# Patient Record
Sex: Female | Born: 1935
Health system: Southern US, Community
[De-identification: ages and names within clinical notes are randomized; demographics above are authoritative.]

## PROBLEM LIST (undated history)

## (undated) DIAGNOSIS — I779 Disorder of arteries and arterioles, unspecified: Secondary | ICD-10-CM

## (undated) DIAGNOSIS — C801 Malignant (primary) neoplasm, unspecified: Secondary | ICD-10-CM

## (undated) DIAGNOSIS — I5189 Other ill-defined heart diseases: Secondary | ICD-10-CM

## (undated) DIAGNOSIS — R55 Syncope and collapse: Secondary | ICD-10-CM

## (undated) DIAGNOSIS — I10 Essential (primary) hypertension: Secondary | ICD-10-CM

## (undated) DIAGNOSIS — C4490 Unspecified malignant neoplasm of skin, unspecified: Secondary | ICD-10-CM

## (undated) DIAGNOSIS — I2510 Atherosclerotic heart disease of native coronary artery without angina pectoris: Secondary | ICD-10-CM

## (undated) DIAGNOSIS — E785 Hyperlipidemia, unspecified: Secondary | ICD-10-CM

## (undated) DIAGNOSIS — J302 Other seasonal allergic rhinitis: Secondary | ICD-10-CM

## (undated) DIAGNOSIS — I739 Peripheral vascular disease, unspecified: Secondary | ICD-10-CM

## (undated) DIAGNOSIS — I951 Orthostatic hypotension: Secondary | ICD-10-CM

## (undated) DIAGNOSIS — M1990 Unspecified osteoarthritis, unspecified site: Secondary | ICD-10-CM

## (undated) DIAGNOSIS — K219 Gastro-esophageal reflux disease without esophagitis: Secondary | ICD-10-CM

## (undated) HISTORY — PX: ABDOMINAL HYSTERECTOMY: SHX81

## (undated) HISTORY — DX: Other ill-defined heart diseases: I51.89

## (undated) HISTORY — PX: CARDIAC CATHETERIZATION: SHX172

## (undated) HISTORY — PX: CATARACT EXTRACTION: SUR2

## (undated) HISTORY — DX: Syncope and collapse: R55

## (undated) HISTORY — PX: BLADDER SUSPENSION: SHX72

## (undated) HISTORY — PX: OTHER SURGICAL HISTORY: SHX169

## (undated) HISTORY — DX: Malignant (primary) neoplasm, unspecified: C80.1

## (undated) HISTORY — DX: Disorder of arteries and arterioles, unspecified: I77.9

## (undated) HISTORY — PX: CYSTOCELE REPAIR: SHX163

## (undated) HISTORY — DX: Gastro-esophageal reflux disease without esophagitis: K21.9

## (undated) HISTORY — DX: Unspecified malignant neoplasm of skin, unspecified: C44.90

## (undated) HISTORY — PX: TONSILLECTOMY: SUR1361

## (undated) HISTORY — DX: Hyperlipidemia, unspecified: E78.5

## (undated) HISTORY — DX: Unspecified osteoarthritis, unspecified site: M19.90

## (undated) HISTORY — DX: Other seasonal allergic rhinitis: J30.2

## (undated) HISTORY — DX: Orthostatic hypotension: I95.1

## (undated) HISTORY — DX: Atherosclerotic heart disease of native coronary artery without angina pectoris: I25.10

## (undated) HISTORY — PX: EYE SURGERY: SHX253

## (undated) HISTORY — DX: Essential (primary) hypertension: I10

## (undated) HISTORY — DX: Peripheral vascular disease, unspecified: I73.9

## (undated) HISTORY — PX: TOTAL ABDOMINAL HYSTERECTOMY: SHX209

## (undated) HISTORY — PX: VAGINAL HYSTERECTOMY: SUR661

---

## 2009-06-05 ENCOUNTER — Ambulatory Visit: Payer: Self-pay | Admitting: Family Medicine

## 2009-08-05 ENCOUNTER — Emergency Department: Payer: Self-pay | Admitting: Emergency Medicine

## 2009-08-12 ENCOUNTER — Ambulatory Visit: Payer: Self-pay | Admitting: Gastroenterology

## 2010-08-13 ENCOUNTER — Ambulatory Visit: Payer: Self-pay | Admitting: Family Medicine

## 2011-08-18 ENCOUNTER — Ambulatory Visit: Payer: Self-pay | Admitting: Family Medicine

## 2012-08-25 ENCOUNTER — Ambulatory Visit: Payer: Self-pay | Admitting: Family Medicine

## 2012-09-16 ENCOUNTER — Ambulatory Visit: Payer: Self-pay | Admitting: Family Medicine

## 2012-09-26 ENCOUNTER — Ambulatory Visit: Payer: Self-pay | Admitting: Family Medicine

## 2013-10-04 ENCOUNTER — Ambulatory Visit: Payer: Self-pay | Admitting: Family Medicine

## 2013-10-05 ENCOUNTER — Ambulatory Visit: Payer: Self-pay | Admitting: Family Medicine

## 2014-03-30 HISTORY — PX: CORONARY ANGIOPLASTY: SHX604

## 2014-04-11 DIAGNOSIS — J0140 Acute pansinusitis, unspecified: Secondary | ICD-10-CM | POA: Diagnosis not present

## 2014-04-11 DIAGNOSIS — R079 Chest pain, unspecified: Secondary | ICD-10-CM | POA: Diagnosis not present

## 2014-05-01 ENCOUNTER — Ambulatory Visit (INDEPENDENT_AMBULATORY_CARE_PROVIDER_SITE_OTHER): Payer: Medicare PPO | Admitting: Cardiovascular Disease

## 2014-05-01 ENCOUNTER — Encounter: Payer: Self-pay | Admitting: Cardiovascular Disease

## 2014-05-01 ENCOUNTER — Ambulatory Visit: Payer: Self-pay | Admitting: Cardiovascular Disease

## 2014-05-01 VITALS — BP 140/60 | HR 59 | Ht 65.25 in | Wt 176.8 lb

## 2014-05-01 DIAGNOSIS — Z720 Tobacco use: Secondary | ICD-10-CM

## 2014-05-01 DIAGNOSIS — I209 Angina pectoris, unspecified: Secondary | ICD-10-CM

## 2014-05-01 DIAGNOSIS — R053 Chronic cough: Secondary | ICD-10-CM

## 2014-05-01 DIAGNOSIS — K219 Gastro-esophageal reflux disease without esophagitis: Secondary | ICD-10-CM

## 2014-05-01 DIAGNOSIS — J9809 Other diseases of bronchus, not elsewhere classified: Secondary | ICD-10-CM | POA: Diagnosis not present

## 2014-05-01 DIAGNOSIS — Z8249 Family history of ischemic heart disease and other diseases of the circulatory system: Secondary | ICD-10-CM

## 2014-05-01 DIAGNOSIS — K224 Dyskinesia of esophagus: Secondary | ICD-10-CM

## 2014-05-01 DIAGNOSIS — R079 Chest pain, unspecified: Secondary | ICD-10-CM | POA: Diagnosis not present

## 2014-05-01 DIAGNOSIS — R05 Cough: Secondary | ICD-10-CM

## 2014-05-01 DIAGNOSIS — I10 Essential (primary) hypertension: Secondary | ICD-10-CM

## 2014-05-01 DIAGNOSIS — Z87891 Personal history of nicotine dependence: Secondary | ICD-10-CM

## 2014-05-01 DIAGNOSIS — Z01810 Encounter for preprocedural cardiovascular examination: Secondary | ICD-10-CM | POA: Diagnosis not present

## 2014-05-01 DIAGNOSIS — E785 Hyperlipidemia, unspecified: Secondary | ICD-10-CM | POA: Diagnosis not present

## 2014-05-01 LAB — BASIC METABOLIC PANEL
Anion Gap: 7 (ref 7–16)
BUN: 16 mg/dL (ref 7–18)
CO2: 31 mmol/L (ref 21–32)
Calcium, Total: 9.9 mg/dL (ref 8.5–10.1)
Chloride: 104 mmol/L (ref 98–107)
Creatinine: 0.87 mg/dL (ref 0.60–1.30)
EGFR (Non-African Amer.): >60
GLUCOSE: 96 mg/dL (ref 65–99)
Osmolality: 284 (ref 275–301)
POTASSIUM: 4 mmol/L (ref 3.5–5.1)
SODIUM: 142 mmol/L (ref 136–145)

## 2014-05-01 LAB — CBC WITH DIFFERENTIAL/PLATELET
Basophil #: 0.1 10*3/uL (ref 0.0–0.1)
Basophil %: 0.8 %
EOS PCT: 4.5 %
Eosinophil #: 0.3 10*3/uL (ref 0.0–0.7)
HCT: 40 % (ref 35.0–47.0)
HGB: 13.6 g/dL (ref 12.0–16.0)
Lymphocyte #: 3.3 10*3/uL (ref 1.0–3.6)
Lymphocyte %: 43.9 %
MCH: 30 pg (ref 26.0–34.0)
MCHC: 33.9 g/dL (ref 32.0–36.0)
MCV: 88 fL (ref 80–100)
MONO ABS: 0.6 x10 3/mm (ref 0.2–0.9)
Monocyte %: 8.3 %
NEUTROS PCT: 42.5 %
Neutrophil #: 3.1 10*3/uL (ref 1.4–6.5)
Platelet: 176 10*3/uL (ref 150–440)
RBC: 4.52 10*6/uL (ref 3.80–5.20)
RDW: 13.6 % (ref 11.5–14.5)
WBC: 7.4 10*3/uL (ref 3.6–11.0)

## 2014-05-01 LAB — PROTIME-INR
INR: 1
Prothrombin Time: 12.9 secs

## 2014-05-01 MED ORDER — NITROGLYCERIN 0.4 MG SL SUBL: 0.4000 mg | SUBLINGUAL_TABLET | SUBLINGUAL | Status: DC | PRN

## 2014-05-01 NOTE — Assessment & Plan Note (Signed)
Long history of smoking, stopped in 1980, smoked for 25 years

## 2014-05-01 NOTE — Patient Instructions (Addendum)
We will schedule a cardiac catheterization next Monday 05/07/14  Please take Nitro for chest pain as needed Continue to take aspirin daily  Please call us if you have new issues that need to be addressed before your next appt.  Your physician wants you to follow-up in: 3 weeks   [facility] Cardiac Cath Instructions   You are scheduled for a Cardiac Cath on:____Monday, Feb 8____  Please arrive at __11:30am ___on the day of your procedure  You will need to pre-register prior to the day of your procedure.  Enter through the [employer] at [facility].  Registration is the first desk on your right.  Please take the procedure order we have given you in order to be registered appropriately  Do not eat/drink anything after midnight  Someone will need to drive you home  It is recommended someone be with you for the first 24 hours after your procedure  Wear clothes that are easy to get on/off and wear slip on shoes if possible  Medications bring a current list of all medications with you  _X_ Do not take these medications before your procedure:Losartan and HCTZ   Day of your procedure: Arrive at the [facility] entrance.  Free valet service is available.  After entering the [facility] please check-in at the registration desk (1st desk on your right) to receive your armband. After receiving your armband someone will escort you to the cardiac cath/special procedures waiting area.  The usual length of stay after your procedure is about 2 to 3 hours.  This can vary.  If you have any questions, please call our office at [phone], or you may call the cardiac cath lab at [facility] directly at [phone]

## 2014-05-01 NOTE — Assessment & Plan Note (Signed)
Blood pressure is well controlled on today's visit. No changes made to the medications. 

## 2014-05-01 NOTE — Assessment & Plan Note (Signed)
Total cholesterol 254. She does not want a statin

## 2014-05-01 NOTE — Assessment & Plan Note (Signed)
Brother with several MIs, sister with stent

## 2014-05-01 NOTE — Assessment & Plan Note (Addendum)
Etiology of her cough is unclear. Possibly from GERD. No clear signs of congestive heart failure on today's visit

## 2014-05-01 NOTE — Assessment & Plan Note (Signed)
Recommended that she stay on a proton pump inhibitor

## 2014-05-01 NOTE — Progress Notes (Signed)
Patient ID: Deborah Gibson, female    DOB: [birth year], 81 y.o.   MRN: [id number]  HPI Comments: [name] is a very pleasant 81 year old woman with history of hypertension, long history of smoking for 25 years, previous esophageal problems with prior cardiac catheterization 10 years ago in [place], strong family history of CAD who presents for evaluation of chest pain. She is a patient of Dr. Jeananne Rama.  She reports that over the past year she has had chest pain with exertion. She describes the pain as being a deep pain in her mediastinum that comes on with heavy walking or exertion. Over the past several weeks or months, symptoms have been getting worse. Recently went on a cruise at the beginning of January 2016 for 7 days. When walking on the cruise ship, she noticed a repetitive pattern of chest pain if she walked too far or too fast. Symptoms resolved with stopping, recurred with exertion again. This is her same pain that now seem to be getting worse with any exertion.   She does have occasional problems with regurgitation though reports her suffered she'll issues are different from her current chest pain. Symptoms of chest pain also radiating up into her neck. She has been bowling in a league, competitively once a week at least. She's been having symptoms of chest pain while bowling.  She reports that her mother had heart issues, sister had a stent, brother had heart attack in defibrillator Patient has total cholesterol 254, LDL was unable to be calculated given triglycerides 417. She does not want a statin   EKG on today's visit showing normal sinus rhythm with rate 59 bpm, no significant ST or T-wave changes  Prior cardiac catheterization at [facility], [place], [place],    Allergies  Allergen Reactions  . Scopolamine Anaphylaxis  . Amlodipine   . Codeine     Sick on stomach   . Statins     Legs ache     Outpatient Encounter Prescriptions as of  79/04/2014  Medication Sig  . carvedilol (COREG) 25 MG tablet Take 25 mg by mouth 2 (two) times daily with a meal.   . Cinnamon 500 MG TABS Take 500 mg by mouth daily.  . Coenzyme Q10 (COQ10) 200 MG CAPS Take by mouth.  . esomeprazole (NEXIUM) 40 MG capsule Take 40 mg by mouth daily at 12 noon.  . fexofenadine (ALLEGRA) 180 MG tablet Take 180 mg by mouth daily.  . fluticasone (FLONASE) 50 MCG/ACT nasal spray Place into both nostrils daily.   . Glucos-Chondroit-Collag-Hyal (GLUCOSAMINE CHONDROIT-COLLAGEN PO) Take by mouth daily.  . hydrochlorothiazide (HYDRODIURIL) 25 MG tablet Take 25 mg by mouth 2 (two) times daily.   Marland Kitchen losartan (COZAAR) 50 MG tablet Take 50 mg by mouth daily.   . Multiple Vitamin (MULTIVITAMIN) tablet Take 1 tablet by mouth daily.  . nitroGLYCERIN (NITROSTAT) 0.4 MG SL tablet Place 1 tablet (0.4 mg total) under the tongue every 5 (five) minutes as needed for chest pain.  . Turmeric Curcumin 500 MG CAPS Take by mouth daily.  . vitamin B-12 (CYANOCOBALAMIN) 500 MCG tablet Take 500 mcg by mouth daily.  . vitamin C (ASCORBIC ACID) 500 MG tablet Take 500 mg by mouth daily.    Past Medical History  Diagnosis Date  . GERD (gastroesophageal reflux disease)   . Hypertension   . Hyperlipidemia   . Seasonal allergies     Past Surgical History  Procedure Laterality Date  . Vaginal hysterectomy    .  Cardiac catheterization      [place], [place]  . Tonsillectomy    . Cystocele repair    . Cataract extraction      Social History  reports that she quit smoking about 36 years ago. Her smoking use included Cigarettes. She has a 69 pack-year smoking history. She does not have any smokeless tobacco history on file. She reports that she does not drink alcohol or use illicit drugs.  Family History family history includes Heart attack in her brother; Heart disease in her brother, brother, and sister; Heart failure in her mother; Hyperlipidemia in her mother; Hypertension in her  brother, mother, and sister.  Review of Systems  Constitutional: Negative.   Respiratory: Negative.   Cardiovascular: Negative.   Gastrointestinal: Negative.   Musculoskeletal: Negative.   Skin: Negative.   Neurological: Negative.   Hematological: Negative.   Psychiatric/Behavioral: Negative.   All other systems reviewed and are negative.   BP 140/60 mmHg  Pulse 59  Ht 5' 5.25" (1.657 m)  Wt 176 lb 12 oz (80.173 kg)  BMI 29.20 kg/m2  Physical Exam  Constitutional: She is oriented to person, place, and time. She appears well-developed and well-nourished.  HENT:  Head: Normocephalic.  Nose: Nose normal.  Mouth/Throat: Oropharynx is clear and moist.  Eyes: Conjunctivae are normal. Pupils are equal, round, and reactive to light.  Neck: Normal range of motion. Neck supple. No JVD present.  Cardiovascular: Normal rate, regular rhythm, S1 normal, S2 normal, normal heart sounds and intact distal pulses.  Exam reveals no gallop and no friction rub.   No murmur heard. Pulmonary/Chest: Effort normal and breath sounds normal. No respiratory distress. She has no wheezes. She has no rales. She exhibits no tenderness.  Abdominal: Soft. Bowel sounds are normal. She exhibits no distension. There is no tenderness.  Musculoskeletal: Normal range of motion. She exhibits no edema or tenderness.  Lymphadenopathy:    She has no cervical adenopathy.  Neurological: She is alert and oriented to person, place, and time. Coordination normal.  Skin: Skin is warm and dry. No rash noted. No erythema.  Psychiatric: She has a normal mood and affect. Her behavior is normal. Judgment and thought content normal.    Assessment and Plan  Nursing note and vitals reviewed.

## 2014-05-01 NOTE — Assessment & Plan Note (Addendum)
After long discussion of her symptoms and various treatment options, she prefers cardiac catheterization to rule out ischemia. Symptoms are classic for stable angina. No recent stress test or echocardiogram. We will give her nitroglycerin sublingual for symptoms. Catheterization will be scheduled February 8. Encouraged her to stay on her aspirin. She does not want a statin Risks and benefits of the cardiac catheterization were discussed with her including heart attack and stroke. Labs and chest x-ray ordered in preparation

## 2014-05-02 ENCOUNTER — Encounter: Payer: Self-pay | Admitting: Cardiovascular Disease

## 2014-05-07 ENCOUNTER — Ambulatory Visit: Payer: Self-pay | Admitting: Cardiovascular Disease

## 2014-05-07 DIAGNOSIS — I10 Essential (primary) hypertension: Secondary | ICD-10-CM | POA: Diagnosis not present

## 2014-05-07 DIAGNOSIS — Z888 Allergy status to other drugs, medicaments and biological substances status: Secondary | ICD-10-CM | POA: Diagnosis not present

## 2014-05-07 DIAGNOSIS — R079 Chest pain, unspecified: Secondary | ICD-10-CM | POA: Diagnosis not present

## 2014-05-07 DIAGNOSIS — R0602 Shortness of breath: Secondary | ICD-10-CM | POA: Diagnosis not present

## 2014-05-07 DIAGNOSIS — Z720 Tobacco use: Secondary | ICD-10-CM | POA: Diagnosis not present

## 2014-05-07 DIAGNOSIS — K219 Gastro-esophageal reflux disease without esophagitis: Secondary | ICD-10-CM | POA: Diagnosis not present

## 2014-05-07 DIAGNOSIS — Z885 Allergy status to narcotic agent status: Secondary | ICD-10-CM | POA: Diagnosis not present

## 2014-05-07 DIAGNOSIS — I209 Angina pectoris, unspecified: Secondary | ICD-10-CM

## 2014-05-07 DIAGNOSIS — Z8249 Family history of ischemic heart disease and other diseases of the circulatory system: Secondary | ICD-10-CM | POA: Diagnosis not present

## 2014-05-07 DIAGNOSIS — Z79899 Other long term (current) drug therapy: Secondary | ICD-10-CM | POA: Diagnosis not present

## 2014-05-09 ENCOUNTER — Encounter: Payer: Self-pay | Admitting: Cardiovascular Disease

## 2014-05-17 DIAGNOSIS — I10 Essential (primary) hypertension: Secondary | ICD-10-CM | POA: Diagnosis not present

## 2014-05-17 DIAGNOSIS — R5383 Other fatigue: Secondary | ICD-10-CM | POA: Diagnosis not present

## 2014-05-23 ENCOUNTER — Encounter: Payer: Self-pay | Admitting: Cardiovascular Disease

## 2014-05-23 ENCOUNTER — Ambulatory Visit (INDEPENDENT_AMBULATORY_CARE_PROVIDER_SITE_OTHER): Payer: Commercial Managed Care - HMO | Admitting: Cardiovascular Disease

## 2014-05-23 VITALS — BP 128/82 | HR 65 | Ht 65.5 in | Wt 180.8 lb

## 2014-05-23 DIAGNOSIS — E785 Hyperlipidemia, unspecified: Secondary | ICD-10-CM | POA: Diagnosis not present

## 2014-05-23 DIAGNOSIS — R42 Dizziness and giddiness: Secondary | ICD-10-CM

## 2014-05-23 DIAGNOSIS — R61 Generalized hyperhidrosis: Secondary | ICD-10-CM | POA: Diagnosis not present

## 2014-05-23 DIAGNOSIS — IMO0001 Reserved for inherently not codable concepts without codable children: Secondary | ICD-10-CM

## 2014-05-23 DIAGNOSIS — I2510 Atherosclerotic heart disease of native coronary artery without angina pectoris: Secondary | ICD-10-CM | POA: Diagnosis not present

## 2014-05-23 DIAGNOSIS — R079 Chest pain, unspecified: Secondary | ICD-10-CM

## 2014-05-23 DIAGNOSIS — I10 Essential (primary) hypertension: Secondary | ICD-10-CM | POA: Diagnosis not present

## 2014-05-23 MED ORDER — ALBUTEROL SULFATE HFA 108 (90 BASE) MCG/ACT IN AERS: 1.0000 | INHALATION_SPRAY | RESPIRATORY_TRACT | Status: DC | PRN

## 2014-05-23 NOTE — Assessment & Plan Note (Signed)
Currently not on a cholesterol medication. Given minimal coronary disease, we will defer this to her. She is not inclined to start a medication at this time

## 2014-05-23 NOTE — Assessment & Plan Note (Signed)
I'm concerned about mild COPD contributing to her symptoms of shortness of breath and chest tightness with heavy exertion. Recommended she try albuterol inhaler prior to heavy exertion

## 2014-05-23 NOTE — Patient Instructions (Signed)
You are doing well.  Please try albuterol as needed for chest tightness and shortness of breath  Please call us if you have new issues that need to be addressed before your next appt.  Your physician wants you to follow-up in: 12 months.  You will receive a reminder letter in the mail two months in advance. If you don't receive a letter, please call our office to schedule the follow-up appointment.

## 2014-05-23 NOTE — Progress Notes (Signed)
Patient ID: TEA COLLUMS, female    DOB: 10/23/1931, 81 y.o.   MRN: [id number]  HPI Comments: Ms. Frenz is a very pleasant 81 year old woman with history of hypertension, long history of smoking for 25 years, previous esophageal problems with prior cardiac catheterization 10 years ago in [place], strong family history of CAD who previously presented for chest pain symptoms, had cardiac catheterization 05/07/2014.  She is a patient of Dr. Jeananne Rama.  Catheterization showed mild luminal irregularities with no significant stenoses, normal ejection fraction She has recovered from her catheterization with no complications. She continues to have chest discomfort with heavy exertion, describes as a tightness in her chest, shortness of breath  She does offer that she did smoke but stopped some time ago She's never tried inhalers She has been bowling in a league, competitively once a week at least. She's been having symptoms of chest pain while bowling.  EKG on today's visit shows normal sinus rhythm with rate 65 bpm, no significant ST or T-wave changes  Other past medical history She reports that her mother had heart issues, sister had a stent, brother had heart attack in defibrillator Patient has total cholesterol 254, LDL was unable to be calculated given triglycerides 417. She does not want a statin   Prior cardiac catheterization at [facility], [place], [place],    Allergies  Allergen Reactions  . Scopolamine Anaphylaxis  . Amlodipine   . Codeine     Sick on stomach   . Statins     Legs ache     Outpatient Encounter Prescriptions as of 79/24/2016  Medication Sig  . albuterol (PROVENTIL HFA;VENTOLIN HFA) 108 (90 BASE) MCG/ACT inhaler Inhale 1-2 puffs into the lungs every 4 (four) hours as needed for wheezing or shortness of breath.  . carvedilol (COREG) 25 MG tablet Take 25 mg by mouth 2 (two) times daily with a meal.   . Cinnamon 500 MG TABS Take 500  mg by mouth daily.  . Coenzyme Q10 (COQ10) 200 MG CAPS Take by mouth.  . esomeprazole (NEXIUM) 40 MG capsule Take 40 mg by mouth daily at 12 noon.  . fexofenadine (ALLEGRA) 180 MG tablet Take 180 mg by mouth daily.  . fluticasone (FLONASE) 50 MCG/ACT nasal spray Place into both nostrils daily.   . Glucos-Chondroit-Collag-Hyal (GLUCOSAMINE CHONDROIT-COLLAGEN PO) Take by mouth daily.  . hydrochlorothiazide (HYDRODIURIL) 25 MG tablet Take 25 mg by mouth 2 (two) times daily.   Marland Kitchen losartan (COZAAR) 50 MG tablet Take 50 mg by mouth daily.   . Multiple Vitamin (MULTIVITAMIN) tablet Take 1 tablet by mouth daily.  . nitroGLYCERIN (NITROSTAT) 0.4 MG SL tablet Place 1 tablet (0.4 mg total) under the tongue every 5 (five) minutes as needed for chest pain.  . Turmeric Curcumin 500 MG CAPS Take by mouth daily.  . vitamin B-12 (CYANOCOBALAMIN) 500 MCG tablet Take 500 mcg by mouth daily.  . vitamin C (ASCORBIC ACID) 500 MG tablet Take 500 mg by mouth daily.    Past Medical History  Diagnosis Date  . GERD (gastroesophageal reflux disease)   . Hypertension   . Hyperlipidemia   . Seasonal allergies     Past Surgical History  Procedure Laterality Date  . Vaginal hysterectomy    . Cardiac catheterization      [place], [place]  . Tonsillectomy    . Cystocele repair    . Cataract extraction      Social History  reports that she quit smoking about 36 years  ago. Her smoking use included Cigarettes. She has a 69 pack-year smoking history. She does not have any smokeless tobacco history on file. She reports that she does not drink alcohol or use illicit drugs.  Family History family history includes Heart attack in her brother; Heart disease in her brother, brother, and sister; Heart failure in her mother; Hyperlipidemia in her mother; Hypertension in her brother, mother, and sister.  Review of Systems  Constitutional: Negative.   Respiratory: Negative.   Cardiovascular: Negative.    Gastrointestinal: Negative.   Musculoskeletal: Negative.   Skin: Negative.   Neurological: Negative.   Hematological: Negative.   Psychiatric/Behavioral: Negative.   All other systems reviewed and are negative.   BP 128/82 mmHg  Pulse 65  Ht 5' 5.5" (1.664 m)  Wt 180 lb 12.8 oz (82.01 kg)  BMI 29.62 kg/m2  Physical Exam  Constitutional: She is oriented to person, place, and time. She appears well-developed and well-nourished.  HENT:  Head: Normocephalic.  Nose: Nose normal.  Mouth/Throat: Oropharynx is clear and moist.  Eyes: Conjunctivae are normal. Pupils are equal, round, and reactive to light.  Neck: Normal range of motion. Neck supple. No JVD present.  Cardiovascular: Normal rate, regular rhythm, S1 normal, S2 normal, normal heart sounds and intact distal pulses.  Exam reveals no gallop and no friction rub.   No murmur heard. Pulmonary/Chest: Effort normal and breath sounds normal. No respiratory distress. She has no wheezes. She has no rales. She exhibits no tenderness.  Abdominal: Soft. Bowel sounds are normal. She exhibits no distension. There is no tenderness.  Musculoskeletal: Normal range of motion. She exhibits no edema or tenderness.  Lymphadenopathy:    She has no cervical adenopathy.  Neurological: She is alert and oriented to person, place, and time. Coordination normal.  Skin: Skin is warm and dry. No rash noted. No erythema.  Psychiatric: She has a normal mood and affect. Her behavior is normal. Judgment and thought content normal.    Assessment and Plan  Nursing note and vitals reviewed.

## 2014-05-23 NOTE — Assessment & Plan Note (Signed)
Blood pressure is well controlled on today's visit. No changes made to the medications. 

## 2014-05-23 NOTE — Assessment & Plan Note (Signed)
Recent cardiac catheterization 05/07/2014 showing nondominant right coronary system with mild luminal irregularities, normal ejection fraction She is currently not on a statin per her choosing

## 2014-06-06 DIAGNOSIS — Z961 Presence of intraocular lens: Secondary | ICD-10-CM | POA: Diagnosis not present

## 2014-11-22 ENCOUNTER — Ambulatory Visit (INDEPENDENT_AMBULATORY_CARE_PROVIDER_SITE_OTHER): Payer: Commercial Managed Care - HMO | Admitting: Family Medicine

## 2014-11-22 ENCOUNTER — Encounter: Payer: Self-pay | Admitting: Family Medicine

## 2014-11-22 VITALS — BP 144/72 | HR 68 | Temp 98.3°F | Ht 65.6 in | Wt 184.0 lb

## 2014-11-22 DIAGNOSIS — K219 Gastro-esophageal reflux disease without esophagitis: Secondary | ICD-10-CM

## 2014-11-22 DIAGNOSIS — I10 Essential (primary) hypertension: Secondary | ICD-10-CM

## 2014-11-22 DIAGNOSIS — R490 Dysphonia: Secondary | ICD-10-CM

## 2014-11-22 DIAGNOSIS — R0789 Other chest pain: Secondary | ICD-10-CM | POA: Diagnosis not present

## 2014-11-22 DIAGNOSIS — Z0000 Encounter for general adult medical examination without abnormal findings: Secondary | ICD-10-CM

## 2014-11-22 LAB — URINALYSIS, ROUTINE W REFLEX MICROSCOPIC
Bilirubin, UA: NEGATIVE
GLUCOSE, UA: NEGATIVE
KETONES UA: NEGATIVE
NITRITE UA: NEGATIVE
Protein, UA: NEGATIVE
RBC, UA: NEGATIVE
SPEC GRAV UA: 1.015 (ref 1.005–1.030)
Urobilinogen, Ur: 1 mg/dL (ref 0.2–1.0)
pH, UA: 6 (ref 5.0–7.5)

## 2014-11-22 LAB — MICROSCOPIC EXAMINATION

## 2014-11-22 MED ORDER — ESOMEPRAZOLE MAGNESIUM 40 MG PO CPDR: 40.0000 mg | DELAYED_RELEASE_CAPSULE | Freq: Every day | ORAL | Status: DC

## 2014-11-22 MED ORDER — HYDROCHLOROTHIAZIDE 25 MG PO TABS: 25.0000 mg | ORAL_TABLET | Freq: Every day | ORAL | Status: DC

## 2014-11-22 MED ORDER — LOSARTAN POTASSIUM 25 MG PO TABS: 25.0000 mg | ORAL_TABLET | Freq: Every day | ORAL | Status: DC

## 2014-11-22 MED ORDER — CARVEDILOL 25 MG PO TABS: 25.0000 mg | ORAL_TABLET | Freq: Two times a day (BID) | ORAL | Status: DC

## 2014-11-22 NOTE — Progress Notes (Signed)
BP 144/72 mmHg  Pulse 68  Temp(Src) 98.3 F (36.8 C)  Ht 5' 5.6" (1.666 m)  Wt 184 lb (83.462 kg)  BMI 30.07 kg/m2  SpO2 96%   Subjective:    Patient ID: Deborah Gibson, female    DOB: [birth year], 81 y.o.   MRN: [id number]  HPI: Deborah Gibson is a 81 y.o. female  Chief Complaint  Patient presents with  . Annual Exam  . Heartburn   patient with continued problems with her throat chest dyspnea with exertion and some upper chest pain. With some confusion as patient has 2 [place] charts reviewed cardiology notes show no coronary artery disease. Cardiology felt that may be a COPD component to patient's symptoms has tried albuterol prior to exertion with no significant effect. Asian taking Nexium 20 mg Having hoarseness coughing And substernal esophageal-type pain.  and patient is a nonsmoker hasn't smoked for 30+ years.  Patient with leg pain and radicular symptoms as been ongoing for years getting somewhat worse with some radicular symptoms into left leg. Has trouble walking up a hill because of her leg pain. Also makes her chest hurt.  Relevant past medical, surgical, family and social history reviewed and updated as indicated. Interim medical history since our last visit reviewed. Allergies and medications reviewed and updated.  Other than noted above Review of Systems  Constitutional: Negative.   HENT: Negative.   Eyes: Negative.   Respiratory: Negative.   Cardiovascular: Negative.   Gastrointestinal: Negative.   Endocrine: Negative.   Genitourinary: Negative.   Musculoskeletal: Negative.   Skin: Negative.   Allergic/Immunologic: Negative.   Neurological: Negative.   Hematological: Negative.   Psychiatric/Behavioral: Negative.     Per HPI unless specifically indicated above     Objective:    BP 144/72 mmHg  Pulse 68  Temp(Src) 98.3 F (36.8 C)  Ht 5' 5.6" (1.666 m)  Wt 184 lb (83.462 kg)  BMI 30.07 kg/m2  SpO2 96%  Wt Readings from Last 3  Encounters:  11/22/14 184 lb (83.462 kg)  05/17/14 180 lb (81.647 kg)    Physical Exam  Constitutional: She is oriented to person, place, and time. She appears well-developed and well-nourished.  HENT:  Head: Normocephalic and atraumatic.  Right Ear: External ear normal.  Left Ear: External ear normal.  Nose: Nose normal.  Mouth/Throat: Oropharynx is clear and moist.  Eyes: Conjunctivae and EOM are normal. Pupils are equal, round, and reactive to light.  Neck: Normal range of motion. Neck supple. Carotid bruit is not present.  Cardiovascular: Normal rate, regular rhythm and normal heart sounds.   No murmur heard. Pulmonary/Chest: Effort normal and breath sounds normal.  Abdominal: Soft. Bowel sounds are normal. There is no hepatosplenomegaly.  Musculoskeletal: Normal range of motion.  Neurological: She is alert and oriented to person, place, and time.  Skin: No rash noted.  Psychiatric: She has a normal mood and affect. Her behavior is normal. Judgment and thought content normal.    No results found for this or any previous visit.    Assessment & Plan:   Problem List Items Addressed This Visit      Cardiovascular and Mediastinum   Hypertension - Primary    Patient has decreased her medication by half blood pressure was getting too low Feeling lightheaded and dizzy will give prescription for losartan 25mg       Relevant Medications   losartan (COZAAR) 25 MG tablet   hydrochlorothiazide (HYDRODIURIL) 25 MG tablet   carvedilol (COREG)  25 MG tablet   Other Relevant Orders   Comprehensive metabolic panel   Urinalysis, Routine w reflex microscopic (not at [facility])     Digestive   GERD (gastroesophageal reflux disease)    Will increase Nexium to 40 mg and because of chronic hoarseness will refer to ear nose throat      Relevant Medications   esomeprazole (NEXIUM) 40 MG capsule   Other Relevant Orders   Comprehensive metabolic panel     Other   Hoarseness of voice    With  hoarseness getting worse and probable reflux and family history of laryngeal cancer will refer to ear nose and throat to further evaluate Increase Nexium to 40 mg      Relevant Orders   Comprehensive metabolic panel   TSH   Ambulatory referral to ENT    Other Visit Diagnoses    PE (physical exam), annual        Relevant Orders    Comprehensive metabolic panel    Lipid panel    TSH    Urinalysis, Routine w reflex microscopic (not at [facility])    Other chest pain        Relevant Orders    Lipid panel        Follow up plan: Return in about 6 months (around 05/25/2015), or if symptoms worsen or fail to improve, for med check.

## 2014-11-22 NOTE — Assessment & Plan Note (Signed)
With hoarseness getting worse and probable reflux and family history of laryngeal cancer will refer to ear nose and throat to further evaluate Increase Nexium to 40 mg

## 2014-11-22 NOTE — Assessment & Plan Note (Signed)
Will increase Nexium to 40 mg and because of chronic hoarseness will refer to ear nose throat

## 2014-11-22 NOTE — Assessment & Plan Note (Addendum)
Patient has decreased her medication by half blood pressure was getting too low Feeling lightheaded and dizzy will give prescription for losartan 25mg 

## 2014-11-23 LAB — COMPREHENSIVE METABOLIC PANEL
A/G RATIO: 1.6 (ref 1.1–2.5)
ALBUMIN: 4.2 g/dL (ref 3.5–4.7)
ALT: 24 IU/L (ref 0–32)
AST: 29 IU/L (ref 0–40)
Alkaline Phosphatase: 74 IU/L (ref 39–117)
BILIRUBIN TOTAL: 0.6 mg/dL (ref 0.0–1.2)
BUN / CREAT RATIO: 18 (ref 11–26)
BUN: 16 mg/dL (ref 8–27)
CHLORIDE: 98 mmol/L (ref 97–108)
CO2: 25 mmol/L (ref 18–29)
Calcium: 9.5 mg/dL (ref 8.7–10.3)
Creatinine, Ser: 0.87 mg/dL (ref 0.57–1.00)
GFR calc non Af Amer: 63 mL/min/{1.73_m2} (ref >59)
GFR, EST AFRICAN AMERICAN: 72 mL/min/{1.73_m2} (ref >59)
Globulin, Total: 2.7 g/dL (ref 1.5–4.5)
Glucose: 94 mg/dL (ref 65–99)
POTASSIUM: 4.3 mmol/L (ref 3.5–5.2)
SODIUM: 140 mmol/L (ref 134–144)
TOTAL PROTEIN: 6.9 g/dL (ref 6.0–8.5)

## 2014-11-23 LAB — LIPID PANEL
Chol/HDL Ratio: 6.9 ratio units — ABNORMAL HIGH (ref 0.0–4.4)
Cholesterol, Total: 261 mg/dL — ABNORMAL HIGH (ref 100–199)
HDL: 38 mg/dL — ABNORMAL LOW (ref >39)
LDL Calculated: 144 mg/dL — ABNORMAL HIGH (ref 0–99)
Triglycerides: 393 mg/dL — ABNORMAL HIGH (ref 0–149)
VLDL Cholesterol Cal: 79 mg/dL — ABNORMAL HIGH (ref 5–40)

## 2014-11-23 LAB — TSH: TSH: 2.46 u[IU]/mL (ref 0.450–4.500)

## 2014-11-26 ENCOUNTER — Encounter: Payer: Self-pay | Admitting: Family Medicine

## 2014-12-04 DIAGNOSIS — R490 Dysphonia: Secondary | ICD-10-CM | POA: Diagnosis not present

## 2014-12-04 DIAGNOSIS — J387 Other diseases of larynx: Secondary | ICD-10-CM | POA: Diagnosis not present

## 2014-12-18 ENCOUNTER — Ambulatory Visit: Payer: Commercial Managed Care - HMO | Attending: Unknown Physician Specialty | Admitting: Speech Pathology

## 2014-12-18 DIAGNOSIS — R490 Dysphonia: Secondary | ICD-10-CM | POA: Diagnosis not present

## 2014-12-19 ENCOUNTER — Ambulatory Visit: Payer: Commercial Managed Care - HMO | Admitting: Speech Pathology

## 2014-12-19 ENCOUNTER — Encounter: Payer: Self-pay | Admitting: Speech Pathology

## 2014-12-19 NOTE — Therapy (Signed)
[place] MAIN [facility] SERVICES [address] [place], [place], [address] Phone: [phone]   Fax:  [phone]  Speech Language Pathology Evaluation  Patient Details  Name: Deborah Gibson MRN: [id number] Date of Birth: 02/11/1932 Referring Provider:  Beverly Gust, MD  Encounter Date: 12/18/2014      End of Session - 12/19/14 1015    Visit Number 1   Number of Visits 17   Date for SLP Re-Evaluation 02/17/15   SLP Start Time 0846   SLP Stop Time  0932   SLP Time Calculation (min) 52 min   Activity Tolerance Patient tolerated treatment well      Past Medical History  Diagnosis Date   GERD (gastroesophageal reflux disease)    Hypertension    Hyperlipidemia    Seasonal allergies     Past Surgical History  Procedure Laterality Date   Vaginal hysterectomy     Cardiac catheterization      [place], [place]   Tonsillectomy     Cystocele repair     Cataract extraction      There were no vitals filed for this visit.  Visit Diagnosis: Dysphonia - Plan: SLP plan of care cert/re-cert      Subjective Assessment - 12/19/14 1014    Subjective Patient states that she noticed problems with her voice "a few" months ago.  She reports vocal hoarseness and coughing while singing.   She has been evaluated by Dr. Tami Ribas with report of mild vocal cord bowing.   Currently in Pain? No/denies            SLP Evaluation [facility] - 12/19/14 0001    SLP Visit Information   SLP Received On 12/18/14   Onset Date 12/04/2014   Medical Diagnosis Vocal cord bowing   Subjective   Patient/Family Stated Goal Patient would like to have normal vocal quality and to be able to sing   Prior Functional Status   Cognitive/Linguistic Baseline --  Normal vocal quality and able to sing with church choir   Oral Motor/Sensory Function   Overall Oral Motor/Sensory Function Appears within functional limits for tasks assessed   Motor Speech   Overall Motor Speech Appears within functional limits for tasks assessed   Standardized Assessments   Standardized Assessments  Other Assessment  Perceptual Voice Evaluation       Perceptual Voice Evaluation  Voice checklist:  Health risks: GERD, allergies, caffeine use, limited water intake.  Characteristic voice use: sing with the church [employer] and sings some solos  Environmental risks: may have mold in the house  Misuse: glottal fry, no vocal warm up prior to singing, speaks without adequate breath support  Abuse: frequent throat clearing, coughing while singing, speaks in large groups at times  Vocal characteristics: limited pitch range, lower habitual pitch, hoarseness, strained voice  Patient Quality of Life Survey: Voice Handicap Index-10 Score of 12  A score of 10 or higher indicates perceived handicap  Maximum phonation time for sustained ah: 5 seconds  Average fundamental frequency during sustained ah: 244 Hz  Average time patient was able to sustain /s/: 7.6 seconds  Average time patient was able to sustain /z/: 2.7 seconds  s/z ratio : 2.8  Visi-Pitch: Multi-Dimensional Voice Program (MDVP)  MDVP extracts objective quantitative values (Relative Average Perturbation, Shimmer, Voice Turbulence Index, and Noise to Harmonic Ratio) on sustained phonation, which are displayed graphically and numerically in comparison to a built-in normative database.  The patient exhibited values  outside the norm for Relative Average Perturbation, Shimmer, Voice Turbulence Index, and Noise to Harmonic Ratio.  The patient improved all parameters when standing and when cued to alter voicing (high pitched e).          SLP Education - 01-07-2015 1015    Education provided Yes   Education Details Neck and lingual stretches, abdominal breathing   Person(s) Educated Patient   Methods Explanation;Demonstration;Verbal cues;Handout   Comprehension Verbalized  understanding;Returned demonstration;Need further instruction;Verbal cues required            SLP Long Term Goals - 2015-01-07 1018    SLP LONG TERM GOAL #1   Title The patient will demonstrate independent understanding of vocal hygiene concepts and neck, shoulder, lingual stretching exercises.   Time 8   Period Weeks   Status New   SLP LONG TERM GOAL #2   Title The patient will be independent for abdominal breathing and breath support exercises.   Time 8   Period Weeks   Status New   SLP LONG TERM GOAL #3   Title The patient will maximize voice quality and loudness using breath support for sustained vowel production, pitch glides, and hierarchal speech drill.   Time 8   Period Weeks   Status New   SLP LONG TERM GOAL #4   Title The patient will maximize voice quality and loudness using breath support for paragraph length recitation with 80% accuracy.   Time 8   Period Weeks   Status New          Plan - 2015/01/07 1017    Clinical Impression Statement This 81 year old woman with vocal cord bowing is presenting with moderate dysphonia.  The patient demonstrates hoarse vocal quality, reduced breath control for speech, strained/tense phonation, limited pitch range, and laryngeal tension. She will benefit from voice therapy for education, to improve breath support, improve tone focus, promote easy flow phonation, and learn techniques to increase loudness and pitch range without strain.   Speech Therapy Frequency 2x / week   Duration Other (comment)  8 weeks   Treatment/Interventions Other (comment)  Voice therapy   Potential to Achieve Goals Good   Potential Considerations Ability to learn/carryover information;Cooperation/participation level;Medical prognosis;Previous level of function;Severity of impairments;Family/community support   SLP Home Exercise Plan Voice Building    Consulted and Agree with Plan of Care Patient          G-Codes - 2015-01-07 1020    Functional  Assessment Tool Used Perceptual Voice Evaluation   Functional Limitations Voice   Voice Current Status [phone]) At least 40 percent but less than 60 percent impaired, limited or restricted   Voice Goal Status (G9172) At least 1 percent but less than 20 percent impaired, limited or restricted      Problem List Patient Active Problem List   Diagnosis Date Noted   Coronary artery disease 05/23/2014   Pain in the chest 05/01/2014   Hyperlipidemia 05/01/2014   Family history of early CAD 05/01/2014   Essential hypertension 05/01/2014   Smoking history 05/01/2014   Esophageal dysmotility 05/01/2014   Gastroesophageal reflux disease without esophagitis 05/01/2014   Chronic cough 05/01/2014   Leroy Sea, MS/CCC- SLP  Lou Miner 01/07/15, 10:23 AM  [place] [address] [place], [place], [address] Phone: [phone]   Fax:  [phone]

## 2014-12-20 ENCOUNTER — Encounter: Payer: Self-pay | Admitting: Speech Pathology

## 2014-12-24 ENCOUNTER — Ambulatory Visit: Payer: Commercial Managed Care - HMO | Admitting: Speech Pathology

## 2014-12-25 ENCOUNTER — Ambulatory Visit: Payer: Commercial Managed Care - HMO | Admitting: Speech Pathology

## 2014-12-25 DIAGNOSIS — R490 Dysphonia: Secondary | ICD-10-CM

## 2014-12-26 ENCOUNTER — Ambulatory Visit: Payer: Commercial Managed Care - HMO | Admitting: Speech Pathology

## 2014-12-26 ENCOUNTER — Encounter: Payer: Self-pay | Admitting: Speech Pathology

## 2014-12-26 NOTE — Therapy (Signed)
[place] MAIN [facility] SERVICES [address] [place], [place], [address] Phone: [phone]   Fax:  [phone]  Speech Language Pathology Treatment  Patient Details  Name: Deborah Gibson MRN: [id number] Date of Birth: Oct 16, 1931 Referring Provider:  Beverly Gust, MD  Encounter Date: 12/25/2014      End of Session - 12/26/14 0931    Visit Number 2   Number of Visits 17   Date for SLP Re-Evaluation 02/17/15   SLP Start Time 0900   SLP Stop Time  0956   SLP Time Calculation (min) 56 min   Activity Tolerance Patient tolerated treatment well      Past Medical History  Diagnosis Date  . Arthritis   . Cancer   . GERD (gastroesophageal reflux disease)   . Hypertension   . Hyperlipidemia   . Seasonal allergies     Past Surgical History  Procedure Laterality Date  . Bladder suspension    . Abdominal hysterectomy    . Rectal vaginal fistula repair    . Eye surgery      cataract surgery  . Coronary angioplasty  2016  . Vaginal hysterectomy    . Cardiac catheterization      [place], [place]  . Tonsillectomy    . Cystocele repair    . Cataract extraction      There were no vitals filed for this visit.  Visit Diagnosis: Dysphonia      Subjective Assessment - 12/26/14 0930    Subjective The patient is eager to improve her vocal quality and ability to sing with her church.   Currently in Pain? No/denies               ADULT SLP TREATMENT - 12/26/14 0001    General Information   Behavior/Cognition Alert;Cooperative;Pleasant mood   Treatment Provided   Treatment provided Cognitive-Linquistic   Pain Assessment   Pain Assessment No/denies pain   Cognitive-Linquistic Treatment   Treatment focused on Voice   Skilled Treatment The patient was provided with written and verbal teaching regarding vocal hygiene/reflux precautions.  The patient was provided with written and verbal teaching regarding neck and shoulder  relaxation exercises to promote relaxed phonation.  The patient demonstrates good performance independently.  The patient was provided with written and verbal teaching regarding breath support exercises.  Improved abdominal breathing, but needs more work to improve breath support.  Patient instructed in relaxed phonation / oral resonance.   Patient able to improve vocal quality with nasality to improve oral resonance and loudness to decrease laryngeal strain. Read aloud 1-syllable initial /m/ words with loud, good quality voice with 80% accuracy but not able to maintain oral resonance in 2-syllable words.   Assessment / Recommendations / Plan   Plan Continue with current plan of care   Progression Toward Goals   Progression toward goals Progressing toward goals          SLP Education - 12/26/14 0930    Education provided Yes   Education Details Vocal hygiene/reflux precautions, neck and tongue stretches, breath support exercises, oral resonance, vocal loudness   Person(s) Educated Patient   Methods Explanation;Demonstration;Verbal cues;Handout   Comprehension Verbalized understanding;Returned demonstration;Verbal cues required;Need further instruction            SLP Long Term Goals - 12/19/14 1018    SLP LONG TERM GOAL #1   Title The patient will demonstrate independent understanding of vocal hygiene concepts and neck, shoulder, lingual stretching exercises.   Time  8   Period Weeks   Status New   SLP LONG TERM GOAL #2   Title The patient will be independent for abdominal breathing and breath support exercises.   Time 8   Period Weeks   Status New   SLP LONG TERM GOAL #3   Title The patient will maximize voice quality and loudness using breath support for sustained vowel production, pitch glides, and hierarchal speech drill.   Time 8   Period Weeks   Status New   SLP LONG TERM GOAL #4   Title The patient will maximize voice quality and loudness using breath support for  paragraph length recitation with 80% accuracy.   Time 8   Period Weeks   Status New          Plan - 12/26/14 0931    Clinical Impression Statement  Patient able to improve vocal quality with nasality to improve oral resonance and loudness to decrease laryngeal strain.   Speech Therapy Frequency 2x / week   Duration Other (comment)   Treatment/Interventions Other (comment)  Voice therapy   Potential to Achieve Goals Good   Potential Considerations Ability to learn/carryover information;Cooperation/participation level;Medical prognosis;Previous level of function;Severity of impairments;Family/community support   SLP Home Exercise Plan Voice Building    Consulted and Agree with Plan of Care Patient        Problem List Patient Active Problem List   Diagnosis Date Noted  . GERD (gastroesophageal reflux disease) 11/22/2014  . Hoarseness of voice 11/22/2014  . Hyperlipidemia   . Hypertension   . Coronary artery disease 05/23/2014  . Pain in the chest 05/01/2014  . Hyperlipidemia 05/01/2014  . Family history of early CAD 05/01/2014  . Essential hypertension 05/01/2014  . Smoking history 05/01/2014  . Esophageal dysmotility 05/01/2014  . Gastroesophageal reflux disease without esophagitis 05/01/2014  . Chronic cough 05/01/2014   Leroy Sea, MS/CCC- SLP  Lou Miner 12/26/2014, 9:32 AM  [place] MAIN [facility] SERVICES [address] [place], [place], [address] Phone: [phone]   Fax:  [phone]

## 2014-12-27 ENCOUNTER — Encounter: Payer: Self-pay | Admitting: Speech Pathology

## 2014-12-27 ENCOUNTER — Ambulatory Visit: Payer: Commercial Managed Care - HMO | Admitting: Speech Pathology

## 2014-12-27 DIAGNOSIS — R490 Dysphonia: Secondary | ICD-10-CM | POA: Diagnosis not present

## 2014-12-27 NOTE — Therapy (Signed)
[place] MAIN [facility] SERVICES [address] [place], [place], [address] Phone: [phone]   Fax:  [phone]  Speech Language Pathology Treatment  Patient Details  Name: Deborah Gibson MRN: [id number] Date of Birth: [birth year] Referring Provider:  Beverly Gust, MD  Encounter Date: 12/27/2014      End of Session - 12/27/14 0950    Visit Number 3   Number of Visits 17   Date for SLP Re-Evaluation 02/17/15   SLP Start Time 0900   SLP Stop Time  0946   SLP Time Calculation (min) 46 min   Activity Tolerance Patient tolerated treatment well      Past Medical History  Diagnosis Date  . Arthritis   . Cancer   . GERD (gastroesophageal reflux disease)   . Hypertension   . Hyperlipidemia   . Seasonal allergies     Past Surgical History  Procedure Laterality Date  . Bladder suspension    . Abdominal hysterectomy    . Rectal vaginal fistula repair    . Eye surgery      cataract surgery  . Coronary angioplasty  2016  . Vaginal hysterectomy    . Cardiac catheterization      [place], [place]  . Tonsillectomy    . Cystocele repair    . Cataract extraction      There were no vitals filed for this visit.  Visit Diagnosis: Dysphonia      Subjective Assessment - 12/27/14 0950    Subjective The patient is eager to improve her vocal quality and ability to sing with her church.   Currently in Pain? No/denies               ADULT SLP TREATMENT - 12/27/14 0001    General Information   Behavior/Cognition Alert;Cooperative;Pleasant mood   Treatment Provided   Treatment provided Cognitive-Linquistic   Pain Assessment   Pain Assessment No/denies pain   Cognitive-Linquistic Treatment   Treatment focused on Voice   Skilled Treatment The patient was provided with written and verbal teaching regarding vocal hygiene/reflux precautions.  The patient was provided with written and verbal teaching regarding neck and shoulder  relaxation exercises to promote relaxed phonation.  The patient demonstrates good performance independently.  The patient was provided with written and verbal teaching regarding breath support exercises.  Improved abdominal breathing, but needs more work to improve breath support.  Patient instructed to sustain /s/ while watching a second hand to increase duration.  Patient instructed in relaxed phonation / oral resonance.   Patient able to improve vocal quality with nasality to improve oral resonance and loudness to decrease laryngeal strain. Read aloud 1-syllable initial /m/ words with loud, good quality voice with 50% accuracy and in imitation with 80% accuracy.  With focus on vocal loudness "project your voice loud and clear" patient able to generate phrases with loud, good quality voice with 80% accuracy and in reading sentences with 70% accuracy.   Assessment / Recommendations / Plan   Plan Continue with current plan of care   Progression Toward Goals   Progression toward goals Progressing toward goals          SLP Education - 12/27/14 0950    Education provided Yes   Education Details Vocal hygiene/reflux precautions, neck and tongue stretches, breath support exercises, oral resonance, vocal loudness   Person(s) Educated Patient   Methods Explanation;Demonstration;Verbal cues;Handout   Comprehension Verbalized understanding;Returned demonstration;Verbal cues required;Need further instruction  SLP Long Term Goals - 12/19/14 1018    SLP LONG TERM GOAL #1   Title The patient will demonstrate independent understanding of vocal hygiene concepts and neck, shoulder, lingual stretching exercises.   Time 8   Period Weeks   Status New   SLP LONG TERM GOAL #2   Title The patient will be independent for abdominal breathing and breath support exercises.   Time 8   Period Weeks   Status New   SLP LONG TERM GOAL #3   Title The patient will maximize voice quality and loudness  using breath support for sustained vowel production, pitch glides, and hierarchal speech drill.   Time 8   Period Weeks   Status New   SLP LONG TERM GOAL #4   Title The patient will maximize voice quality and loudness using breath support for paragraph length recitation with 80% accuracy.   Time 8   Period Weeks   Status New          Plan - 12/27/14 0951    Clinical Impression Statement  Patient able to improve vocal quality with nasality to improve oral resonance and loudness to decrease laryngeal strain.   Speech Therapy Frequency 2x / week   Duration Other (comment)   Treatment/Interventions Other (comment)  Voice therapy   Potential to Achieve Goals Good   Potential Considerations Ability to learn/carryover information;Cooperation/participation level;Medical prognosis;Previous level of function;Severity of impairments;Family/community support   SLP Home Exercise Plan Voice Building    Consulted and Agree with Plan of Care Patient        Problem List Patient Active Problem List   Diagnosis Date Noted  . GERD (gastroesophageal reflux disease) 11/22/2014  . Hoarseness of voice 11/22/2014  . Hyperlipidemia   . Hypertension   . Coronary artery disease 05/23/2014  . Pain in the chest 05/01/2014  . Hyperlipidemia 05/01/2014  . Family history of early CAD 05/01/2014  . Essential hypertension 05/01/2014  . Smoking history 05/01/2014  . Esophageal dysmotility 05/01/2014  . Gastroesophageal reflux disease without esophagitis 05/01/2014  . Chronic cough 05/01/2014   Leroy Sea, MS/CCC- SLP  Lou Miner 12/27/2014, 9:52 AM  [place] MAIN [facility] SERVICES [address] [place], [place], [address] Phone: [phone]   Fax:  [phone]

## 2014-12-31 ENCOUNTER — Ambulatory Visit: Payer: Commercial Managed Care - HMO | Admitting: Speech Pathology

## 2015-01-01 ENCOUNTER — Ambulatory Visit: Payer: Commercial Managed Care - HMO | Attending: Unknown Physician Specialty | Admitting: Speech Pathology

## 2015-01-01 ENCOUNTER — Encounter: Payer: Self-pay | Admitting: Speech Pathology

## 2015-01-01 DIAGNOSIS — R490 Dysphonia: Secondary | ICD-10-CM | POA: Insufficient documentation

## 2015-01-01 NOTE — Therapy (Signed)
[place] MAIN [facility] SERVICES [address] [place], [place], [address] Phone: [phone]   Fax:  [phone]  Speech Language Pathology Treatment  Patient Details  Name: Deborah Gibson MRN: [id number] Date of Birth: 06-14-1931 Referring Provider:  Beverly Gust, MD  Encounter Date: 01/01/2015      End of Session - 01/01/15 1035    Visit Number 4   Number of Visits 17   Date for SLP Re-Evaluation 02/17/15   SLP Start Time 0900   SLP Stop Time  0949   SLP Time Calculation (min) 49 min   Activity Tolerance Patient tolerated treatment well      Past Medical History  Diagnosis Date  . Arthritis   . Cancer ([place])   . GERD (gastroesophageal reflux disease)   . Hypertension   . Hyperlipidemia   . Seasonal allergies     Past Surgical History  Procedure Laterality Date  . Bladder suspension    . Abdominal hysterectomy    . Rectal vaginal fistula repair    . Eye surgery      cataract surgery  . Coronary angioplasty  2016  . Vaginal hysterectomy    . Cardiac catheterization      [place], [place]  . Tonsillectomy    . Cystocele repair    . Cataract extraction      There were no vitals filed for this visit.  Visit Diagnosis: Dysphonia      Subjective Assessment - 01/01/15 1034    Subjective The patient is having difficulty monitoring her own voice production.   Currently in Pain? No/denies               ADULT SLP TREATMENT - 01/01/15 0001    General Information   Behavior/Cognition Alert;Cooperative;Pleasant mood   Treatment Provided   Treatment provided Cognitive-Linquistic   Pain Assessment   Pain Assessment No/denies pain   Cognitive-Linquistic Treatment   Treatment focused on Voice   Skilled Treatment The patient was provided with written and verbal teaching regarding vocal hygiene/reflux precautions.  The patient was provided with written and verbal teaching regarding neck and shoulder relaxation  exercises to promote relaxed phonation.  The patient demonstrates good performance independently.  The patient was provided with written and verbal teaching regarding breath support exercises.  Improved abdominal breathing, but needs more work to improve breath support.  Patient instructed to sustain /s/ while watching a second hand to increase duration.  Patient instructed in relaxed phonation / oral resonance.   Patient able to improve vocal quality with nasality to improve oral resonance and loudness to decrease laryngeal strain. With focus on vocal loudness "project your voice loud and clear" patient able to generate phrases with loud, good quality voice with 70% accuracy and in off-the-cuff remarks with 50% accuracy.   Assessment / Recommendations / Plan   Plan Continue with current plan of care   Progression Toward Goals   Progression toward goals Progressing toward goals          SLP Education - 01/01/15 1035    Education provided Yes   Education Details Vocal hygiene/reflux precautions, neck and tongue stretches, breath support exercises, oral resonance, vocal loudness   Person(s) Educated Patient   Methods Explanation;Demonstration;Verbal cues;Handout   Comprehension Verbalized understanding;Returned demonstration;Verbal cues required;Need further instruction            SLP Long Term Goals - 12/19/14 1018    SLP LONG TERM GOAL #1   Title The patient will  demonstrate independent understanding of vocal hygiene concepts and neck, shoulder, lingual stretching exercises.   Time 8   Period Weeks   Status New   SLP LONG TERM GOAL #2   Title The patient will be independent for abdominal breathing and breath support exercises.   Time 8   Period Weeks   Status New   SLP LONG TERM GOAL #3   Title The patient will maximize voice quality and loudness using breath support for sustained vowel production, pitch glides, and hierarchal speech drill.   Time 8   Period Weeks   Status New    SLP LONG TERM GOAL #4   Title The patient will maximize voice quality and loudness using breath support for paragraph length recitation with 80% accuracy.   Time 8   Period Weeks   Status New          Plan - 01/01/15 1035    Clinical Impression Statement  Patient able to improve vocal quality with vocal loudness to improve oral resonance and loudness to decrease laryngeal strain.  She is improving in highly structured speech tasks with minimal cues and in less structured tasks given constant and moderate SLP cues.    Speech Therapy Frequency 2x / week   Duration Other (comment)   Treatment/Interventions Other (comment)  Voice therapy   Potential to Achieve Goals Good   Potential Considerations Ability to learn/carryover information;Cooperation/participation level;Medical prognosis;Previous level of function;Severity of impairments;Family/community support   SLP Home Exercise Plan Voice Building    Consulted and Agree with Plan of Care Patient        Problem List Patient Active Problem List   Diagnosis Date Noted  . GERD (gastroesophageal reflux disease) 11/22/2014  . Hoarseness of voice 11/22/2014  . Hyperlipidemia   . Hypertension   . Coronary artery disease 05/23/2014  . Pain in the chest 05/01/2014  . Hyperlipidemia 05/01/2014  . Family history of early CAD 05/01/2014  . Essential hypertension 05/01/2014  . Smoking history 05/01/2014  . Esophageal dysmotility 05/01/2014  . Gastroesophageal reflux disease without esophagitis 05/01/2014  . Chronic cough 05/01/2014   Leroy Sea, MS/CCC- SLP  Lou Miner 01/01/2015, 10:37 AM  [place] MAIN [facility] SERVICES [address] [place], [place], [address] Phone: [phone]   Fax:  [phone]

## 2015-01-02 ENCOUNTER — Ambulatory Visit: Payer: Commercial Managed Care - HMO | Admitting: Speech Pathology

## 2015-01-03 ENCOUNTER — Ambulatory Visit: Payer: Commercial Managed Care - HMO | Admitting: Speech Pathology

## 2015-01-04 ENCOUNTER — Encounter: Payer: Self-pay | Admitting: Speech Pathology

## 2015-01-04 ENCOUNTER — Ambulatory Visit: Payer: Commercial Managed Care - HMO | Admitting: Speech Pathology

## 2015-01-04 DIAGNOSIS — R490 Dysphonia: Secondary | ICD-10-CM | POA: Diagnosis not present

## 2015-01-04 NOTE — Therapy (Signed)
[place] MAIN [facility] SERVICES [address] [place], [place], [address] Phone: [phone]   Fax:  [phone]  Speech Language Pathology Treatment/Discharge Summary  Patient Details  Name: Deborah Gibson MRN: [id number] Date of Birth: 1931-12-05 Referring Provider:  Beverly Gust, MD  Encounter Date: 01/04/2015      End of Session - 01/04/15 1001    Visit Number 5   Number of Visits 17   Date for SLP Re-Evaluation 02/17/15   SLP Start Time 0901   SLP Stop Time  0946   SLP Time Calculation (min) 45 min   Activity Tolerance Patient tolerated treatment well      Past Medical History  Diagnosis Date  . Arthritis   . Cancer ([place])   . GERD (gastroesophageal reflux disease)   . Hypertension   . Hyperlipidemia   . Seasonal allergies     Past Surgical History  Procedure Laterality Date  . Bladder suspension    . Abdominal hysterectomy    . Rectal vaginal fistula repair    . Eye surgery      cataract surgery  . Coronary angioplasty  2016  . Vaginal hysterectomy    . Cardiac catheterization      [place], [place]  . Tonsillectomy    . Cystocele repair    . Cataract extraction      There were no vitals filed for this visit.  Visit Diagnosis: Dysphonia      Subjective Assessment - 01/04/15 1000    Subjective The patient states she is ready for discharge, she feels that she has the skills and knowledge to continue to improve her voice.   Currently in Pain? No/denies               ADULT SLP TREATMENT - 01/04/15 0001    General Information   Behavior/Cognition Alert;Cooperative;Pleasant mood   Treatment Provided   Treatment provided Cognitive-Linquistic   Pain Assessment   Pain Assessment No/denies pain   Cognitive-Linquistic Treatment   Treatment focused on Voice   Skilled Treatment The patient was provided with written and verbal teaching regarding vocal hygiene/reflux precautions.  The patient was  provided with written and verbal teaching regarding neck and shoulder relaxation exercises to promote relaxed phonation.  The patient demonstrates good performance independently.  The patient was provided with written and verbal teaching regarding breath support exercises.  Improved abdominal breathing, but needs more work to improve breath support.  Patient instructed to sustain /s/ while watching a second hand to increase duration.  Patient instructed in relaxed phonation / oral resonance.   Patient able to improve vocal quality with nasality to improve oral resonance and loudness to decrease laryngeal strain. With focus on vocal loudness "project your voice loud and clear" patient able to generate sentences, in response to linguistic task, with loud, good quality voice with 90% accuracy and in conversation with 80% accuracy.   Assessment / Recommendations / Plan   Plan Discharge SLP treatment due to (comment);All goals met   Progression Toward Goals   Progression toward goals Goals met, education completed, patient discharged from SLP          SLP Education - 01/04/15 1000    Education provided Yes   Education Details Vocal hygiene/reflux precautions, neck and tongue stretches, breath support exercises, oral resonance, vocal loudness   Person(s) Educated Patient   Methods Explanation;Demonstration;Handout   Comprehension Verbalized understanding;Returned demonstration            SLP  Long Term Goals - 01/21/2015 1002    SLP LONG TERM GOAL #1   Title The patient will demonstrate independent understanding of vocal hygiene concepts and neck, shoulder, lingual stretching exercises.   Status Achieved   SLP LONG TERM GOAL #2   Title The patient will be independent for abdominal breathing and breath support exercises.   Status Achieved   SLP LONG TERM GOAL #3   Title The patient will maximize voice quality and loudness using breath support for sustained vowel production, pitch glides, and  hierarchal speech drill.   Status Achieved   SLP LONG TERM GOAL #4   Title The patient will maximize voice quality and loudness using breath support for paragraph length recitation with 80% accuracy.   Status Achieved          Plan - 01-21-2015 1001    Clinical Impression Statement  The patient has met her personal goals as well as established voice therapy goals.  She able to improve vocal quality with vocal loudness to improve oral resonance and loudness to decrease laryngeal strain.  She is generalizing to conversational speech with minimal reminders.  She reports that she is able to sing without triggering a cough.   Speech Therapy Frequency 2x / week   Duration Other (comment)   Treatment/Interventions Other (comment)  Voice therapy   Potential to Achieve Goals Good   Potential Considerations Ability to learn/carryover information;Cooperation/participation level;Medical prognosis;Previous level of function;Severity of impairments;Family/community support   SLP Home Exercise Plan Voice Building    Consulted and Agree with Plan of Care Patient          G-Codes - 01/21/2015 1003    Functional Assessment Tool Used Vocie therapy tasks, clinical judgment   Functional Limitations Voice   Voice Current Status (G9171) At least 1 percent but less than 20 percent impaired, limited or restricted   Voice Goal Status (J0300) At least 1 percent but less than 20 percent impaired, limited or restricted   Voice Discharge Status (P2330) At least 1 percent but less than 20 percent impaired, limited or restricted      Problem List Patient Active Problem List   Diagnosis Date Noted  . GERD (gastroesophageal reflux disease) 11/22/2014  . Hoarseness of voice 11/22/2014  . Hyperlipidemia   . Hypertension   . Coronary artery disease 05/23/2014  . Pain in the chest 05/01/2014  . Hyperlipidemia 05/01/2014  . Family history of early CAD 05/01/2014  . Essential hypertension 05/01/2014  . Smoking  history 05/01/2014  . Esophageal dysmotility 05/01/2014  . Gastroesophageal reflux disease without esophagitis 05/01/2014  . Chronic cough 05/01/2014   Leroy Sea, MS/CCC- SLP  Lou Miner Jan 21, 2015, 10:04 AM  [place] MAIN [facility] SERVICES [address] [place], [place], [address] Phone: [phone]   Fax:  [phone]

## 2015-01-08 ENCOUNTER — Ambulatory Visit: Payer: Commercial Managed Care - HMO | Admitting: Speech Pathology

## 2015-01-10 ENCOUNTER — Ambulatory Visit: Payer: Commercial Managed Care - HMO | Admitting: Speech Pathology

## 2015-01-15 ENCOUNTER — Ambulatory Visit: Payer: Commercial Managed Care - HMO

## 2015-01-15 ENCOUNTER — Ambulatory Visit: Payer: Commercial Managed Care - HMO | Admitting: Speech Pathology

## 2015-01-15 DIAGNOSIS — K219 Gastro-esophageal reflux disease without esophagitis: Secondary | ICD-10-CM | POA: Diagnosis not present

## 2015-01-15 DIAGNOSIS — R490 Dysphonia: Secondary | ICD-10-CM | POA: Diagnosis not present

## 2015-01-15 DIAGNOSIS — Z23 Encounter for immunization: Secondary | ICD-10-CM

## 2015-01-17 ENCOUNTER — Ambulatory Visit: Payer: Commercial Managed Care - HMO | Admitting: Speech Pathology

## 2015-05-28 ENCOUNTER — Ambulatory Visit: Payer: Commercial Managed Care - HMO | Admitting: Family Medicine

## 2015-07-03 ENCOUNTER — Encounter: Payer: Self-pay | Admitting: Cardiovascular Disease

## 2015-07-03 ENCOUNTER — Ambulatory Visit (INDEPENDENT_AMBULATORY_CARE_PROVIDER_SITE_OTHER): Payer: Commercial Managed Care - HMO | Admitting: Cardiovascular Disease

## 2015-07-03 VITALS — BP 130/60 | HR 66 | Ht 65.5 in | Wt 188.8 lb

## 2015-07-03 DIAGNOSIS — I2510 Atherosclerotic heart disease of native coronary artery without angina pectoris: Secondary | ICD-10-CM | POA: Diagnosis not present

## 2015-07-03 DIAGNOSIS — R079 Chest pain, unspecified: Secondary | ICD-10-CM | POA: Diagnosis not present

## 2015-07-03 DIAGNOSIS — E785 Hyperlipidemia, unspecified: Secondary | ICD-10-CM

## 2015-07-03 DIAGNOSIS — I10 Essential (primary) hypertension: Secondary | ICD-10-CM | POA: Diagnosis not present

## 2015-07-03 NOTE — Progress Notes (Signed)
Patient ID: Deborah Gibson, female    DOB: 15-Aug-1931, 82 y.o.   MRN: [id number]  HPI Comments: [name] is a very pleasant 82 year old woman with history of hypertension, long history of smoking for 25 years, previous esophageal problems with prior cardiac catheterization 10 years ago in [place], strong family history of CAD who previously presented for chest pain symptoms, had cardiac catheterization 05/07/2014.  She is a patient of Dr. Jeananne Rama. She presents today for chest pain symptoms  She reports that she is active, goes bowling frequently, plays with a league Reports having significant tightness in the paravertebral muscles, tightness in her neck radiating sometimes down into the front part of her chest Notices when she walks quickly up hills, she has tightness in her neck sometimes radiating into her front of the chest Walking on flat surfaces she has no significant difficulty She has been seeing massage once per week for her tight neck and upper back muscles  Review of lab work shows total cholesterol 270s  Reports that she stopped losartan as this was causing joint issues and felt like she was overmedicated  Other past medical history reviewed Catheterization showed mild luminal irregularities with no significant stenoses, normal ejection fraction She has recovered from her catheterization with no complications. She continues to have chest discomfort with heavy exertion, describes as a tightness in her chest, shortness of breath  She reports that her mother had heart issues, sister had a stent, brother had heart attack in defibrillator Patient has total cholesterol 254, LDL was unable to be calculated given triglycerides 417. She does not want a statin   Prior cardiac catheterization at [facility], [place], [place],       Allergies  Allergen Reactions  . Scopolamine Anaphylaxis  . Amlodipine   . Amlodipine Swelling  . Benazepril Cough   . Codeine     Sick on stomach   . Crestor [Rosuvastatin] Other (See Comments)    myalgias  . Lovastatin Other (See Comments)    Myalgias   . Scopolamine   . Statins     Legs ache   . Zocor [Simvastatin] Other (See Comments)    myalgias    Current Outpatient Prescriptions on File Prior to Visit  Medication Sig Dispense Refill  . albuterol (PROVENTIL HFA;VENTOLIN HFA) 108 (90 BASE) MCG/ACT inhaler Inhale 1-2 puffs into the lungs every 4 (four) hours as needed for wheezing or shortness of breath. 1 Inhaler 5  . aspirin EC 81 MG tablet Take 81 mg by mouth daily.    . carvedilol (COREG) 25 MG tablet Take 25 mg by mouth 2 (two) times daily with a meal.     . COD LIVER OIL PO Take by mouth.    . Coenzyme Q10 (COQ10) 200 MG CAPS Take by mouth.    . fluticasone (FLONASE) 50 MCG/ACT nasal spray Place 2 sprays into both nostrils daily as needed.     . Glucos-Chondroit-Collag-Hyal (GLUCOSAMINE CHONDROIT-COLLAGEN PO) Take by mouth daily.    Marland Kitchen glucosamine-chondroitin 500-400 MG tablet Take 1 tablet by mouth daily.    . hydrochlorothiazide (HYDRODIURIL) 25 MG tablet Take 1 tablet (25 mg total) by mouth daily. 90 tablet 4  . Multiple Vitamin (MULTIVITAMIN) tablet Take 1 tablet by mouth daily.    . nitroGLYCERIN (NITROSTAT) 0.4 MG SL tablet Place 0.4 mg under the tongue every 5 (five) minutes as needed for chest pain.    . Turmeric Curcumin 500 MG CAPS Take by mouth daily.    Marland Kitchen  vitamin B-12 (CYANOCOBALAMIN) 500 MCG tablet Take 500 mcg by mouth daily.     No current facility-administered medications on file prior to visit.    Past Medical History  Diagnosis Date  . Arthritis   . Cancer ([place])   . GERD (gastroesophageal reflux disease)   . Hypertension   . Hyperlipidemia   . Seasonal allergies     Past Surgical History  Procedure Laterality Date  . Bladder suspension    . Abdominal hysterectomy    . Rectal vaginal fistula repair    . Eye surgery      cataract surgery  . Coronary  angioplasty  2016  . Vaginal hysterectomy    . Cardiac catheterization      [place], [place]  . Tonsillectomy    . Cystocele repair    . Cataract extraction      Social History  reports that she quit smoking about 37 years ago. Her smoking use included Cigarettes. She has a 69 pack-year smoking history. She does not have any smokeless tobacco history on file. She reports that she does not drink alcohol or use illicit drugs.  Family History family history includes Cancer in her sister; Diabetes in her brother; Heart attack in her brother; Heart disease in her brother, brother, and sister; Heart failure in her mother; Hyperlipidemia in her mother; Hypertension in her brother, mother, and sister.   Review of Systems  Constitutional: Negative.   Respiratory: Negative.   Cardiovascular: Negative.   Gastrointestinal: Negative.   Musculoskeletal: Positive for arthralgias and neck pain.  Neurological: Negative.   Hematological: Negative.   Psychiatric/Behavioral: Negative.   All other systems reviewed and are negative.   BP 130/60 mmHg  Pulse 66  Ht 5' 5.5" (1.664 m)  Wt 188 lb 12 oz (85.616 kg)  BMI 30.92 kg/m2  Physical Exam  Constitutional: She is oriented to person, place, and time. She appears well-developed and well-nourished.  Obese  HENT:  Head: Normocephalic.  Nose: Nose normal.  Mouth/Throat: Oropharynx is clear and moist.  Eyes: Conjunctivae are normal. Pupils are equal, round, and reactive to light.  Neck: Normal range of motion. Neck supple. No JVD present.  Cardiovascular: Normal rate, regular rhythm, normal heart sounds and intact distal pulses.  Exam reveals no gallop and no friction rub.   No murmur heard. Pulmonary/Chest: Effort normal and breath sounds normal. No respiratory distress. She has no wheezes. She has no rales. She exhibits no tenderness.  Abdominal: Soft. Bowel sounds are normal. She exhibits no distension. There is no tenderness.   Musculoskeletal: Normal range of motion. She exhibits no edema or tenderness.  Lymphadenopathy:    She has no cervical adenopathy.  Neurological: She is alert and oriented to person, place, and time. Coordination normal.  Skin: Skin is warm and dry. No rash noted. No erythema.  Psychiatric: She has a normal mood and affect. Her behavior is normal. Judgment and thought content normal.

## 2015-07-03 NOTE — Assessment & Plan Note (Signed)
Chronic issues with chest pain, atypical in nature. Prior cardiac catheterization showing no significant disease. No further workup at this time

## 2015-07-03 NOTE — Assessment & Plan Note (Signed)
She is not interested in medications for cholesterol Recommended weight loss, diet, exercise

## 2015-07-03 NOTE — Assessment & Plan Note (Signed)
Blood pressure is well controlled on today's visit. No changes made to the medications. 

## 2015-07-03 NOTE — Assessment & Plan Note (Signed)
Currently with no symptoms of angina. No further workup at this time. Continue current medication regimen. 

## 2015-07-03 NOTE — Patient Instructions (Signed)
You are doing well. No medication changes were made.  Please call us if you have new issues that need to be addressed before your next appt.  Your physician wants you to follow-up in: 12 months.  You will receive a reminder letter in the mail two months in advance. If you don't receive a letter, please call our office to schedule the follow-up appointment. 

## 2015-07-17 DIAGNOSIS — J309 Allergic rhinitis, unspecified: Secondary | ICD-10-CM | POA: Diagnosis not present

## 2015-07-17 DIAGNOSIS — R490 Dysphonia: Secondary | ICD-10-CM | POA: Diagnosis not present

## 2015-09-03 ENCOUNTER — Telehealth: Payer: Self-pay | Admitting: Family Medicine

## 2015-09-03 DIAGNOSIS — L989 Disorder of the skin and subcutaneous tissue, unspecified: Secondary | ICD-10-CM

## 2015-09-03 NOTE — Telephone Encounter (Signed)
Pt needs a new referral to go to [facility] to [name] appt schedule 09-06-15. Fax number [phone]

## 2015-09-06 DIAGNOSIS — L578 Other skin changes due to chronic exposure to nonionizing radiation: Secondary | ICD-10-CM | POA: Diagnosis not present

## 2015-09-06 DIAGNOSIS — L820 Inflamed seborrheic keratosis: Secondary | ICD-10-CM | POA: Diagnosis not present

## 2015-09-06 DIAGNOSIS — C44311 Basal cell carcinoma of skin of nose: Secondary | ICD-10-CM | POA: Diagnosis not present

## 2015-09-06 DIAGNOSIS — L565 Disseminated superficial actinic porokeratosis (DSAP): Secondary | ICD-10-CM | POA: Diagnosis not present

## 2015-09-06 DIAGNOSIS — Z85828 Personal history of other malignant neoplasm of skin: Secondary | ICD-10-CM | POA: Diagnosis not present

## 2015-09-06 DIAGNOSIS — L700 Acne vulgaris: Secondary | ICD-10-CM | POA: Diagnosis not present

## 2015-09-06 DIAGNOSIS — D485 Neoplasm of uncertain behavior of skin: Secondary | ICD-10-CM | POA: Diagnosis not present

## 2015-09-11 ENCOUNTER — Telehealth: Payer: Self-pay | Admitting: Family Medicine

## 2015-09-11 NOTE — Telephone Encounter (Signed)
[facility] called and stated that the pt needed a silverback auth for Dr. Aundra Millet NPI [id number] pt appt 11/18/15 dx code is C44.311

## 2015-09-12 NOTE — Telephone Encounter (Signed)
Authorized starting 11/18/2015 with Dr. Harvel Quale for 6 visits with 6 months.  Authorization#: [id number]

## 2015-10-28 ENCOUNTER — Encounter (INDEPENDENT_AMBULATORY_CARE_PROVIDER_SITE_OTHER): Payer: Self-pay

## 2015-12-03 ENCOUNTER — Ambulatory Visit (INDEPENDENT_AMBULATORY_CARE_PROVIDER_SITE_OTHER): Payer: Commercial Managed Care - HMO | Admitting: Family Medicine

## 2015-12-03 ENCOUNTER — Encounter: Payer: Self-pay | Admitting: Family Medicine

## 2015-12-03 VITALS — BP 126/68 | HR 62 | Temp 98.2°F | Ht 65.7 in | Wt 182.0 lb

## 2015-12-03 DIAGNOSIS — N390 Urinary tract infection, site not specified: Secondary | ICD-10-CM | POA: Diagnosis not present

## 2015-12-03 DIAGNOSIS — I10 Essential (primary) hypertension: Secondary | ICD-10-CM | POA: Diagnosis not present

## 2015-12-03 DIAGNOSIS — R8281 Pyuria: Secondary | ICD-10-CM

## 2015-12-03 DIAGNOSIS — I2510 Atherosclerotic heart disease of native coronary artery without angina pectoris: Secondary | ICD-10-CM | POA: Diagnosis not present

## 2015-12-03 DIAGNOSIS — M79606 Pain in leg, unspecified: Secondary | ICD-10-CM

## 2015-12-03 DIAGNOSIS — E785 Hyperlipidemia, unspecified: Secondary | ICD-10-CM

## 2015-12-03 DIAGNOSIS — Z0000 Encounter for general adult medical examination without abnormal findings: Secondary | ICD-10-CM

## 2015-12-03 LAB — URINALYSIS, ROUTINE W REFLEX MICROSCOPIC
BILIRUBIN UA: NEGATIVE
GLUCOSE, UA: NEGATIVE
KETONES UA: NEGATIVE
Nitrite, UA: NEGATIVE
RBC UA: NEGATIVE
Specific Gravity, UA: 1.015 (ref 1.005–1.030)
Urobilinogen, Ur: 1 mg/dL (ref 0.2–1.0)
pH, UA: 5.5 (ref 5.0–7.5)

## 2015-12-03 LAB — MICROSCOPIC EXAMINATION

## 2015-12-03 MED ORDER — HYDROCHLOROTHIAZIDE 25 MG PO TABS: 25.0000 mg | ORAL_TABLET | Freq: Every day | ORAL | 4 refills | Status: DC

## 2015-12-03 NOTE — Addendum Note (Signed)
Addended byGolden Pop on: 12/03/2015 04:33 PM   Modules accepted: Orders

## 2015-12-03 NOTE — Assessment & Plan Note (Signed)
Reviewed cardiology notes and stable 

## 2015-12-03 NOTE — Assessment & Plan Note (Signed)
Patient with leg pain discomfort with exertion left leg radicular symptoms like sciatica but difficult to separate out claudication type symptoms also may also be neurogenic claudication as comes on mostly with standing. Will get lumbar spine x-ray Referral to vascular to further evaluate for claudication

## 2015-12-03 NOTE — Progress Notes (Signed)
BP 126/68 (BP Location: Left Arm, Patient Position: Sitting, Cuff Size: Normal)   Pulse 62   Temp 98.2 F (36.8 C)   Ht 5' 5.7" (1.669 m)   Wt 182 lb (82.6 kg)   SpO2 96%   BMI 29.64 kg/m    Subjective:    Patient ID: Deborah Gibson, female    DOB: [birth year], 83 y.o.   MRN: [id number]  HPI: Deborah Gibson is a 82 y.o. female  Chief Complaint  Patient presents with  . Annual Exam  AWV metrics  Met Patient with several concerns. Chest tightness with exertion this was worked up the gear ago with stress test and was negative cardiologist Catheterized patient which was normal  Patient does have some left leg radicular symptoms comes on with standing sometimes sitting some numbness in her foot radiation down the L5-S1 dermatome  Blood pressure doing well no complaints from medications  Patient also has which is somewhat difficult to separate out leg heaviness tiredness when she tries to go fast and is unable to continue and has to slow down. Then she can go again.   Relevant past medical, surgical, family and social history reviewed and updated as indicated. Interim medical history since our last visit reviewed. Allergies and medications reviewed and updated.  Review of Systems  Constitutional: Negative.   HENT: Negative.   Eyes: Negative.   Respiratory: Negative.   Cardiovascular: Negative.   Gastrointestinal: Negative.   Endocrine: Negative.   Genitourinary: Negative.   Musculoskeletal: Negative.   Skin: Negative.   Allergic/Immunologic: Negative.   Neurological: Negative.   Hematological: Negative.   Psychiatric/Behavioral: Negative.     Per HPI unless specifically indicated above     Objective:    BP 126/68 (BP Location: Left Arm, Patient Position: Sitting, Cuff Size: Normal)   Pulse 62   Temp 98.2 F (36.8 C)   Ht 5' 5.7" (1.669 m)   Wt 182 lb (82.6 kg)   SpO2 96%   BMI 29.64 kg/m   Wt Readings from Last 3 Encounters:  12/03/15 182 lb (82.6 kg)   07/03/15 188 lb 12 oz (85.6 kg)  11/22/14 184 lb (83.5 kg)    Physical Exam  Constitutional: She is oriented to person, place, and time. She appears well-developed and well-nourished.  HENT:  Head: Normocephalic and atraumatic.  Right Ear: External ear normal.  Left Ear: External ear normal.  Nose: Nose normal.  Mouth/Throat: Oropharynx is clear and moist.  Eyes: Conjunctivae and EOM are normal. Pupils are equal, round, and reactive to light.  Neck: Normal range of motion. Neck supple. Carotid bruit is not present.  Cardiovascular: Normal rate, regular rhythm and normal heart sounds.   No murmur heard. Pulmonary/Chest: Effort normal and breath sounds normal. She exhibits no mass. Right breast exhibits no mass, no skin change and no tenderness. Left breast exhibits no mass, no skin change and no tenderness. Breasts are symmetrical.  Abdominal: Soft. Bowel sounds are normal. There is no hepatosplenomegaly.  Musculoskeletal: Normal range of motion.  Pulses left anterior tibial 2+ (posterior tibial and right posterior tibial pulses absent right anterior tibial trace  Neurological: She is alert and oriented to person, place, and time.  Skin: No rash noted.  Psychiatric: She has a normal mood and affect. Her behavior is normal. Judgment and thought content normal.    Results for orders placed or performed in visit on 11/22/14  Microscopic Examination  Result Value Ref Range   WBC, UA 0-5 0 -  5 /hpf   RBC, UA 0-2 0 - 2 /hpf   Epithelial Cells (non renal) 0-10 0 - 10 /hpf   Casts Present (A) None seen /lpf   Cast Type Hyaline casts N/A   Mucus, UA Present Not Estab.   Bacteria, UA Few None seen/Few  Comprehensive metabolic panel  Result Value Ref Range   Glucose 94 65 - 99 mg/dL   BUN 16 8 - 27 mg/dL   Creatinine, Ser 0.87 0.57 - 1.00 mg/dL   GFR calc non Af Amer 63 >59 mL/min/1.73   GFR calc Af Amer 72 >59 mL/min/1.73   BUN/Creatinine Ratio 18 11 - 26   Sodium 140 134 - 144  mmol/L   Potassium 4.3 3.5 - 5.2 mmol/L   Chloride 98 97 - 108 mmol/L   CO2 25 18 - 29 mmol/L   Calcium 9.5 8.7 - 10.3 mg/dL   Total Protein 6.9 6.0 - 8.5 g/dL   Albumin 4.2 3.5 - 4.7 g/dL   Globulin, Total 2.7 1.5 - 4.5 g/dL   Albumin/Globulin Ratio 1.6 1.1 - 2.5   Bilirubin Total 0.6 0.0 - 1.2 mg/dL   Alkaline Phosphatase 74 39 - 117 IU/L   AST 29 0 - 40 IU/L   ALT 24 0 - 32 IU/L  Lipid panel  Result Value Ref Range   Cholesterol, Total 261 (H) 100 - 199 mg/dL   Triglycerides 393 (H) 0 - 149 mg/dL   HDL 38 (L) >39 mg/dL   VLDL Cholesterol Cal 79 (H) 5 - 40 mg/dL   LDL Calculated 144 (H) 0 - 99 mg/dL   Chol/HDL Ratio 6.9 (H) 0.0 - 4.4 ratio units  TSH  Result Value Ref Range   TSH 2.460 0.450 - 4.500 uIU/mL  Urinalysis, Routine w reflex microscopic (not at [facility])  Result Value Ref Range   Specific Gravity, UA 1.015 1.005 - 1.030   pH, UA 6.0 5.0 - 7.5   Color, UA Yellow Yellow   Appearance Ur Cloudy (A) Clear   Leukocytes, UA 2+ (A) Negative   Protein, UA Negative Negative/Trace   Glucose, UA Negative Negative   Ketones, UA Negative Negative   RBC, UA Negative Negative   Bilirubin, UA Negative Negative   Urobilinogen, Ur 1.0 0.2 - 1.0 mg/dL   Nitrite, UA Negative Negative   Microscopic Examination See below:       Assessment & Plan:   Problem List Items Addressed This Visit      Cardiovascular and Mediastinum   Essential hypertension    The current medical regimen is effective;  continue present plan and medications.       Relevant Medications   hydrochlorothiazide (HYDRODIURIL) 25 MG tablet   Coronary artery disease    Reviewed cardiology notes and stable      Relevant Medications   hydrochlorothiazide (HYDRODIURIL) 25 MG tablet     Other   Hyperlipidemia    Elevated in past but wants to avoid medications      Relevant Medications   hydrochlorothiazide (HYDRODIURIL) 25 MG tablet   Leg pain    Patient with leg pain discomfort with exertion left leg  radicular symptoms like sciatica but difficult to separate out claudication type symptoms also may also be neurogenic claudication as comes on mostly with standing. Will get lumbar spine x-ray Referral to vascular to further evaluate for claudication      Relevant Orders   Ambulatory referral to Vascular Surgery   DG Lumbar Spine 2-3 Views  Other Visit Diagnoses    Well adult exam    -  Primary   Relevant Medications   vitamin A 8000 UNIT capsule   vitamin E 400 UNIT capsule   Chromium-Cinnamon (CINNAMON PLUS CHROMIUM PO)   cetirizine (ZYRTEC) 10 MG tablet   Other Relevant Orders   Urinalysis, Routine w reflex microscopic (not at [facility])   CBC with Differential/Platelet   Comprehensive metabolic panel   Lipid Panel w/o Chol/HDL Ratio   TSH       Follow up plan: Return in about 6 months (around 06/01/2016) for BMP.

## 2015-12-03 NOTE — Assessment & Plan Note (Signed)
Elevated in past but wants to avoid medications

## 2015-12-03 NOTE — Assessment & Plan Note (Signed)
The current medical regimen is effective;  continue present plan and medications.  

## 2015-12-04 LAB — LIPID PANEL W/O CHOL/HDL RATIO
Cholesterol, Total: 222 mg/dL — ABNORMAL HIGH (ref 100–199)
HDL: 43 mg/dL (ref >39)
LDL Calculated: 131 mg/dL — ABNORMAL HIGH (ref 0–99)
TRIGLYCERIDES: 239 mg/dL — AB (ref 0–149)
VLDL CHOLESTEROL CAL: 48 mg/dL — AB (ref 5–40)

## 2015-12-04 LAB — CBC WITH DIFFERENTIAL/PLATELET
BASOS: 1 %
Basophils Absolute: 0 10*3/uL (ref 0.0–0.2)
EOS (ABSOLUTE): 0.3 10*3/uL (ref 0.0–0.4)
Eos: 4 %
Hematocrit: 38.5 % (ref 34.0–46.6)
Hemoglobin: 13.4 g/dL (ref 11.1–15.9)
IMMATURE GRANS (ABS): 0 10*3/uL (ref 0.0–0.1)
IMMATURE GRANULOCYTES: 0 %
LYMPHS: 41 %
Lymphocytes Absolute: 2.8 10*3/uL (ref 0.7–3.1)
MCH: 30.7 pg (ref 26.6–33.0)
MCHC: 34.8 g/dL (ref 31.5–35.7)
MCV: 88 fL (ref 79–97)
Monocytes Absolute: 1.1 10*3/uL — ABNORMAL HIGH (ref 0.1–0.9)
Monocytes: 16 %
NEUTROS PCT: 38 %
Neutrophils Absolute: 2.6 10*3/uL (ref 1.4–7.0)
PLATELETS: 183 10*3/uL (ref 150–379)
RBC: 4.37 x10E6/uL (ref 3.77–5.28)
RDW: 14.3 % (ref 12.3–15.4)
WBC: 6.8 10*3/uL (ref 3.4–10.8)

## 2015-12-04 LAB — COMPREHENSIVE METABOLIC PANEL
ALT: 30 IU/L (ref 0–32)
AST: 36 IU/L (ref 0–40)
Albumin/Globulin Ratio: 1.7 (ref 1.2–2.2)
Albumin: 4.5 g/dL (ref 3.5–4.7)
Alkaline Phosphatase: 97 IU/L (ref 39–117)
BUN/Creatinine Ratio: 19 (ref 12–28)
BUN: 20 mg/dL (ref 8–27)
Bilirubin Total: 0.8 mg/dL (ref 0.0–1.2)
CALCIUM: 9.4 mg/dL (ref 8.7–10.3)
CO2: 26 mmol/L (ref 18–29)
CREATININE: 1.06 mg/dL — AB (ref 0.57–1.00)
Chloride: 94 mmol/L — ABNORMAL LOW (ref 96–106)
GFR, EST AFRICAN AMERICAN: 57 mL/min/{1.73_m2} — AB (ref >59)
GFR, EST NON AFRICAN AMERICAN: 49 mL/min/{1.73_m2} — AB (ref >59)
GLUCOSE: 93 mg/dL (ref 65–99)
Globulin, Total: 2.7 g/dL (ref 1.5–4.5)
Potassium: 3.9 mmol/L (ref 3.5–5.2)
Sodium: 136 mmol/L (ref 134–144)
TOTAL PROTEIN: 7.2 g/dL (ref 6.0–8.5)

## 2015-12-04 LAB — TSH: TSH: 1.75 u[IU]/mL (ref 0.450–4.500)

## 2015-12-05 ENCOUNTER — Encounter: Payer: Self-pay | Admitting: Family Medicine

## 2015-12-05 ENCOUNTER — Telehealth: Payer: Self-pay | Admitting: Family Medicine

## 2015-12-05 DIAGNOSIS — C44311 Basal cell carcinoma of skin of nose: Secondary | ICD-10-CM | POA: Diagnosis not present

## 2015-12-05 LAB — URINE CULTURE

## 2015-12-05 NOTE — Telephone Encounter (Signed)
Phone call Discussed with patient urine culture negative no UTI patient doing okay discussed patient also taking Advil and Aleve aspirin products will discontinue this medication recheck renal function next office visit

## 2015-12-05 NOTE — Telephone Encounter (Signed)
-----   Message from Wynn Maudlin, [place] sent at 12/05/2015  4:54 PM EDT ----- labs

## 2015-12-09 DIAGNOSIS — M79609 Pain in unspecified limb: Secondary | ICD-10-CM | POA: Diagnosis not present

## 2015-12-09 DIAGNOSIS — M5432 Sciatica, left side: Secondary | ICD-10-CM | POA: Diagnosis not present

## 2015-12-09 DIAGNOSIS — I70212 Atherosclerosis of native arteries of extremities with intermittent claudication, left leg: Secondary | ICD-10-CM | POA: Diagnosis not present

## 2015-12-09 DIAGNOSIS — M542 Cervicalgia: Secondary | ICD-10-CM | POA: Diagnosis not present

## 2015-12-31 ENCOUNTER — Ambulatory Visit (INDEPENDENT_AMBULATORY_CARE_PROVIDER_SITE_OTHER): Payer: Self-pay | Admitting: Vascular Surgery

## 2016-01-28 ENCOUNTER — Other Ambulatory Visit (INDEPENDENT_AMBULATORY_CARE_PROVIDER_SITE_OTHER): Payer: Self-pay | Admitting: Vascular Surgery

## 2016-01-28 DIAGNOSIS — M79604 Pain in right leg: Secondary | ICD-10-CM

## 2016-01-28 DIAGNOSIS — M79605 Pain in left leg: Principal | ICD-10-CM

## 2016-01-29 ENCOUNTER — Ambulatory Visit (INDEPENDENT_AMBULATORY_CARE_PROVIDER_SITE_OTHER): Payer: Commercial Managed Care - HMO

## 2016-01-29 ENCOUNTER — Encounter (INDEPENDENT_AMBULATORY_CARE_PROVIDER_SITE_OTHER): Payer: Self-pay | Admitting: Vascular Surgery

## 2016-01-29 ENCOUNTER — Ambulatory Visit (INDEPENDENT_AMBULATORY_CARE_PROVIDER_SITE_OTHER): Payer: Commercial Managed Care - HMO | Admitting: Vascular Surgery

## 2016-01-29 VITALS — BP 182/73 | HR 62 | Resp 15 | Ht 65.5 in | Wt 180.0 lb

## 2016-01-29 DIAGNOSIS — M79605 Pain in left leg: Secondary | ICD-10-CM | POA: Diagnosis not present

## 2016-01-29 DIAGNOSIS — I739 Peripheral vascular disease, unspecified: Secondary | ICD-10-CM | POA: Insufficient documentation

## 2016-01-29 DIAGNOSIS — I10 Essential (primary) hypertension: Secondary | ICD-10-CM

## 2016-01-29 DIAGNOSIS — E785 Hyperlipidemia, unspecified: Secondary | ICD-10-CM | POA: Diagnosis not present

## 2016-01-29 DIAGNOSIS — M79604 Pain in right leg: Secondary | ICD-10-CM

## 2016-01-29 NOTE — Progress Notes (Signed)
Subjective:    Patient ID: Deborah Gibson, female    DOB: September 01, 1931, 82 y.o.   MRN: [id number] Chief Complaint  Patient presents with  . Re-evaluation    Ultrasound follow up   Patient presents to review vascular studies. She was last seen on 12/09/15 for evaluation of painful lower extremities. Patient notes the pain is variable and not always associated with activity. The pain is somewhat consistent day to day occurring on most days. The patient notes the pain also occurs with standing and routinely seems worse as the day wears on. The pain has been progressive over the past several years. The patient underwent an ABI which showed Right ABI: 0.97 and Left 1.13, great toe and PPG waveforms are within normal limits bilaterally (no previous for comparison). An aorto-iliac arterial duplex is notable for triphasic blood flow distally bilaterally. A left lower extremity arterial duplex was notable for triphasic blood flow distally.  The patient denies any rest pain or ulcers to her feet / toes.    Review of Systems  Constitutional: Negative.   HENT: Negative.   Eyes: Negative.   Respiratory: Negative.   Cardiovascular:       Intermittent lower extremity pain  Gastrointestinal: Negative.   Endocrine: Negative.   Genitourinary: Negative.   Musculoskeletal: Negative.   Skin: Negative.   Allergic/Immunologic: Negative.   Neurological: Negative.   Psychiatric/Behavioral: Negative.        Objective:   Physical Exam  Constitutional: She is oriented to person, place, and time. She appears well-developed and well-nourished.  HENT:  Head: Normocephalic and atraumatic.  Eyes: Conjunctivae are normal. Pupils are equal, round, and reactive to light.  Neck: Normal range of motion.  Cardiovascular: Normal rate, regular rhythm, normal heart sounds and intact distal pulses.   Peripheral Vascular Upper Extremity Palpation - Radial pulse - Bilateral - 2+. Inspection of the upper extremities  reveals - Bilateral - No edema present and Rapid capillary refill. Lower Extremity Palpation - Femoral pulse - Bilateral - 2+. Dorsalis pedis pulse - Left - Trace. Right - 1+. Posterior tibial pulse - Bilateral - 1+. Inspection of the lower extremities - Bilateral - Rapid capillary refill. Edema - Bilateral - Mild.  Pulmonary/Chest: Effort normal and breath sounds normal.  Abdominal: Soft. Bowel sounds are normal.  Musculoskeletal: Normal range of motion.  Neurological: She is alert and oriented to person, place, and time.  Skin: Skin is warm and dry.  Psychiatric: She has a normal mood and affect. Her behavior is normal. Judgment and thought content normal.   BP (!) 182/73 (BP Location: Right Arm)   Pulse 62   Resp 15   Ht 5' 5.5" (1.664 m)   Wt 180 lb (81.6 kg)   BMI 29.50 kg/m   Past Medical History:  Diagnosis Date  . Arthritis   . Cancer ([place])   . GERD (gastroesophageal reflux disease)   . Hyperlipidemia   . Hypertension   . Seasonal allergies    Social History   Social History  . Marital status: Married    Spouse name: N/A  . Number of children: N/A  . Years of education: N/A   Occupational History  . Not on file.   Social History Main Topics  . Smoking status: Former Smoker    Packs/day: 3.00    Years: 23.00    Types: Cigarettes    Quit date: 04/15/1978  . Smokeless tobacco: Never Used  . Alcohol use No  . Drug use: No  .  Sexual activity: Not on file   Other Topics Concern  . Not on file   Social History Narrative   ** Merged History Encounter **       Past Surgical History:  Procedure Laterality Date  . ABDOMINAL HYSTERECTOMY    . BLADDER SUSPENSION    . [place], [place]  . CATARACT EXTRACTION    . CORONARY ANGIOPLASTY  2016  . CYSTOCELE REPAIR    . EYE SURGERY     cataract surgery  . rectal vaginal fistula repair    . TONSILLECTOMY    . VAGINAL HYSTERECTOMY     Family History  Problem Relation Age of  Onset  . Hypertension Mother   . Heart failure Mother   . Hyperlipidemia Mother   . Cancer Sister   . Heart disease Sister   . Hypertension Sister   . Diabetes Brother   . Heart disease Brother     pacer/defiib  . Hypertension Brother   . Heart attack Brother   . Heart disease Brother     CABG   Allergies  Allergen Reactions  . Scopolamine Anaphylaxis  . Amlodipine   . Amlodipine Swelling  . Benazepril Cough  . Codeine     Sick on stomach   . Crestor [Rosuvastatin] Other (See Comments)    myalgias  . Lovastatin Other (See Comments)    Myalgias   . Scopolamine   . Statins     Legs ache   . Zocor [Simvastatin] Other (See Comments)    myalgias      Assessment & Plan:  Patient presents to review vascular studies. She was last seen on 12/09/15 for evaluation of painful lower extremities. Patient notes the pain is variable and not always associated with activity. The pain is somewhat consistent day to day occurring on most days. The patient notes the pain also occurs with standing and routinely seems worse as the day wears on. The pain has been progressive over the past several years. The patient underwent an ABI which showed Right ABI: 0.97 and Left 1.13, great toe and PPG waveforms are within normal limits bilaterally (no previous for comparison). An aorto-iliac arterial duplex is notable for triphasic blood flow distally bilaterally. A left lower extremity arterial duplex was notable for triphasic blood flow distally.  The patient denies any rest pain or ulcers to her feet / toes.   1. PVD (peripheral vascular disease) ([place]) - New Studies reviewed with patient. Patient with continued lower extremity intermittent pain however normal ABI and triphasic blood flow. Discomfort may be stemming from neuropathic or DJD. Encouraged follow up with PCP for further workup.  Patient offered yearly follow up however would like to follow up PRN. Patient to continue medical optimization with  ASA.Marland Kitchen Patient to remain abstinent of tobacco use. I have discussed with the patient at length the risk factors for and pathogenesis of atherosclerotic disease and encouraged a healthy diet, regular exercise regimen and blood pressure / glucose control.  The patient was encouraged to call the office in the interim if he experiences any claudication like symptoms, rest pain or ulcers to his feet / toes.  2. Hyperlipidemia - Stable On ASA for medical optimization. Encouraged good control as its slows the progression of atherosclerotic disease  3. Essential hypertension - Stable Encouraged good control as its slows the progression of atherosclerotic disease  Current Outpatient Prescriptions on File Prior to Visit  Medication Sig Dispense Refill  . albuterol (PROVENTIL  HFA;VENTOLIN HFA) 108 (90 BASE) MCG/ACT inhaler Inhale 1-2 puffs into the lungs every 4 (four) hours as needed for wheezing or shortness of breath. 1 Inhaler 5  . aspirin EC 81 MG tablet Take 81 mg by mouth daily.    . carvedilol (COREG) 25 MG tablet Take 25 mg by mouth 2 (two) times daily with a meal.     . cetirizine (ZYRTEC) 10 MG tablet Take 10 mg by mouth daily.    . Chromium-Cinnamon (CINNAMON PLUS CHROMIUM PO) Take by mouth.    . COD LIVER OIL PO Take by mouth.    . Coenzyme Q10 (COQ10) 200 MG CAPS Take by mouth.    . fluticasone (FLONASE) 50 MCG/ACT nasal spray Place 2 sprays into both nostrils daily as needed.     Marland Kitchen glucosamine-chondroitin 500-400 MG tablet Take 1 tablet by mouth daily.    . hydrochlorothiazide (HYDRODIURIL) 25 MG tablet Take 1 tablet (25 mg total) by mouth daily. 90 tablet 4  . Multiple Vitamin (MULTIVITAMIN) tablet Take 1 tablet by mouth daily.    . nitroGLYCERIN (NITROSTAT) 0.4 MG SL tablet Place 0.4 mg under the tongue every 5 (five) minutes as needed for chest pain.    . pantoprazole (PROTONIX) 40 MG tablet Take 40 mg by mouth daily.    . Turmeric Curcumin 500 MG CAPS Take by mouth daily.    .  vitamin A 8000 UNIT capsule Take 8,000 Units by mouth daily.    . vitamin B-12 (CYANOCOBALAMIN) 500 MCG tablet Take 500 mcg by mouth daily.    . vitamin E 400 UNIT capsule Take 400 Units by mouth daily.     No current facility-administered medications on file prior to visit.     There are no Patient Instructions on file for this visit. No Follow-up on file.   KIMBERLY A STEGMAYER, PA-C

## 2016-01-31 ENCOUNTER — Ambulatory Visit (INDEPENDENT_AMBULATORY_CARE_PROVIDER_SITE_OTHER): Payer: Commercial Managed Care - HMO

## 2016-01-31 DIAGNOSIS — Z23 Encounter for immunization: Secondary | ICD-10-CM

## 2016-02-18 ENCOUNTER — Telehealth: Payer: Self-pay | Admitting: Family Medicine

## 2016-02-18 ENCOUNTER — Emergency Department
Admission: EM | Admit: 2016-02-18 | Discharge: 2016-02-18 | Disposition: A | Discharge status: Home / Self Care | Payer: Commercial Managed Care - HMO | Attending: Emergency Medicine | Admitting: Emergency Medicine

## 2016-02-18 ENCOUNTER — Emergency Department: Payer: Commercial Managed Care - HMO

## 2016-02-18 ENCOUNTER — Encounter: Payer: Self-pay | Admitting: Emergency Medicine

## 2016-02-18 DIAGNOSIS — Z859 Personal history of malignant neoplasm, unspecified: Secondary | ICD-10-CM | POA: Diagnosis not present

## 2016-02-18 DIAGNOSIS — K8020 Calculus of gallbladder without cholecystitis without obstruction: Secondary | ICD-10-CM | POA: Insufficient documentation

## 2016-02-18 DIAGNOSIS — I2510 Atherosclerotic heart disease of native coronary artery without angina pectoris: Secondary | ICD-10-CM | POA: Diagnosis not present

## 2016-02-18 DIAGNOSIS — Z79899 Other long term (current) drug therapy: Secondary | ICD-10-CM | POA: Insufficient documentation

## 2016-02-18 DIAGNOSIS — I10 Essential (primary) hypertension: Secondary | ICD-10-CM | POA: Diagnosis not present

## 2016-02-18 DIAGNOSIS — Z7982 Long term (current) use of aspirin: Secondary | ICD-10-CM | POA: Diagnosis not present

## 2016-02-18 DIAGNOSIS — Z87891 Personal history of nicotine dependence: Secondary | ICD-10-CM | POA: Diagnosis not present

## 2016-02-18 DIAGNOSIS — R109 Unspecified abdominal pain: Secondary | ICD-10-CM | POA: Diagnosis present

## 2016-02-18 DIAGNOSIS — R072 Precordial pain: Secondary | ICD-10-CM | POA: Diagnosis not present

## 2016-02-18 LAB — BASIC METABOLIC PANEL
ANION GAP: 10 (ref 5–15)
BUN: 17 mg/dL (ref 6–20)
CO2: 25 mmol/L (ref 22–32)
Calcium: 9.7 mg/dL (ref 8.9–10.3)
Chloride: 99 mmol/L — ABNORMAL LOW (ref 101–111)
Creatinine, Ser: 0.79 mg/dL (ref 0.44–1.00)
GLUCOSE: 168 mg/dL — AB (ref 65–99)
POTASSIUM: 3.4 mmol/L — AB (ref 3.5–5.1)
SODIUM: 134 mmol/L — AB (ref 135–145)

## 2016-02-18 LAB — CBC
HEMATOCRIT: 39.9 % (ref 35.0–47.0)
HEMOGLOBIN: 13.8 g/dL (ref 12.0–16.0)
MCH: 30 pg (ref 26.0–34.0)
MCHC: 34.6 g/dL (ref 32.0–36.0)
MCV: 86.9 fL (ref 80.0–100.0)
Platelets: 191 10*3/uL (ref 150–440)
RBC: 4.59 MIL/uL (ref 3.80–5.20)
RDW: 13.2 % (ref 11.5–14.5)
WBC: 13 10*3/uL — AB (ref 3.6–11.0)

## 2016-02-18 LAB — TROPONIN I: Troponin I: <0.03 ng/mL (ref <0.03)

## 2016-02-18 MED ORDER — MORPHINE SULFATE (PF) 4 MG/ML IV SOLN: 4.0000 mg | Freq: Once | INTRAVENOUS | Status: DC

## 2016-02-18 MED ORDER — IOPAMIDOL (ISOVUE-300) INJECTION 61%
100.0000 mL | Freq: Once | INTRAVENOUS | Status: AC | PRN
  Administered 2016-02-18: 100 mL via INTRAVENOUS

## 2016-02-18 MED ORDER — ALUM & MAG HYDROXIDE-SIMETH 200-200-20 MG/5ML PO SUSP
30.0000 mL | Freq: Once | ORAL | Status: AC
  Administered 2016-02-18: 30 mL via ORAL
  Filled 2016-02-18: qty 30

## 2016-02-18 MED ORDER — ACETAMINOPHEN 325 MG PO TABS: 650.0000 mg | ORAL_TABLET | Freq: Once | ORAL | Status: DC

## 2016-02-18 MED ORDER — ONDANSETRON HCL 4 MG/2ML IJ SOLN
4.0000 mg | Freq: Once | INTRAMUSCULAR | Status: AC
  Administered 2016-02-18: 4 mg via INTRAVENOUS
  Filled 2016-02-18: qty 2

## 2016-02-18 MED ORDER — ONDANSETRON 4 MG PO TBDP
4.0000 mg | ORAL_TABLET | Freq: Once | ORAL | Status: AC
  Administered 2016-02-18: 4 mg via ORAL
  Filled 2016-02-18: qty 1

## 2016-02-18 MED ORDER — IOPAMIDOL (ISOVUE-300) INJECTION 61%
30.0000 mL | Freq: Once | INTRAVENOUS | Status: AC | PRN
  Administered 2016-02-18: 30 mL via ORAL

## 2016-02-18 MED ORDER — OXYCODONE-ACETAMINOPHEN 5-325 MG PO TABS: 1.0000 | ORAL_TABLET | ORAL | 0 refills | Status: DC | PRN

## 2016-02-18 MED ORDER — ONDANSETRON 4 MG PO TBDP: 4.0000 mg | ORAL_TABLET | Freq: Three times a day (TID) | ORAL | 0 refills | Status: DC | PRN

## 2016-02-18 MED ORDER — OXYCODONE-ACETAMINOPHEN 5-325 MG PO TABS
1.0000 | ORAL_TABLET | Freq: Once | ORAL | Status: AC
  Administered 2016-02-18: 1 via ORAL
  Filled 2016-02-18: qty 1

## 2016-02-18 NOTE — ED Notes (Signed)
Patient transported to CT at this time. 

## 2016-02-18 NOTE — ED Notes (Signed)
2 unsuccessful PIV attempts (left hand and right hand) by Seth Bake, RN. Lattie Haw, RN in to attempt PIV access at this time.

## 2016-02-18 NOTE — ED Provider Notes (Signed)
[facility] Emergency Department Provider Note  ____________________________________________   First MD Initiated Contact with Patient 02/18/16 0400     (approximate)  I have reviewed the triage vital signs and the nursing notes.   HISTORY  Chief Complaint Chest Pain   HPI ROBENA MALONEY is a 82 y.o. female who presents with abdominal and chest pain that is primarily epigastric pain that started yesterday at 2:30 PM. Pain radiates to back. Pain improves while lying down. Patient reports nausea and 4 episodes of vomiting. No urinary symptoms. No diarrhea or constipation. Patient has taken Alka Selter and Tylenol for symptoms. Patient has a history of GERD. Patient has never had an EGD.    Past Medical History:  Diagnosis Date  . Arthritis   . Cancer ([place])   . GERD (gastroesophageal reflux disease)   . Hyperlipidemia   . Hypertension   . Seasonal allergies     Patient Active Problem List   Diagnosis Date Noted  . PVD (peripheral vascular disease) ([place]) 01/29/2016  . Leg pain 12/03/2015  . GERD (gastroesophageal reflux disease) 11/22/2014  . Hoarseness of voice 11/22/2014  . Hyperlipidemia   . Hypertension   . Coronary artery disease 05/23/2014  . Pain in the chest 05/01/2014  . Hyperlipidemia 05/01/2014  . Family history of early CAD 05/01/2014  . Essential hypertension 05/01/2014  . Smoking history 05/01/2014  . Esophageal dysmotility 05/01/2014  . Gastroesophageal reflux disease without esophagitis 05/01/2014  . Chronic cough 05/01/2014    Past Surgical History:  Procedure Laterality Date  . ABDOMINAL HYSTERECTOMY    . BLADDER SUSPENSION    . [place], [place]  . CATARACT EXTRACTION    . CORONARY ANGIOPLASTY  2016  . CYSTOCELE REPAIR    . EYE SURGERY     cataract surgery  . rectal vaginal fistula repair    . TONSILLECTOMY    . VAGINAL HYSTERECTOMY      Prior to Admission medications     Medication Sig Start Date End Date Taking? Authorizing Provider  albuterol (PROVENTIL HFA;VENTOLIN HFA) 108 (90 BASE) MCG/ACT inhaler Inhale 1-2 puffs into the lungs every 4 (four) hours as needed for wheezing or shortness of breath. 05/23/14  Yes Minna Merritts, MD  aspirin EC 81 MG tablet Take 81 mg by mouth daily.   Yes Historical Provider, MD  carvedilol (COREG) 25 MG tablet Take 25 mg by mouth 2 (two) times daily with a meal.  03/29/14  Yes Historical Provider, MD  cetirizine (ZYRTEC) 10 MG tablet Take 10 mg by mouth daily.   Yes Historical Provider, MD  Chromium-Cinnamon (CINNAMON PLUS CHROMIUM PO) Take by mouth.   Yes Historical Provider, MD  COD LIVER OIL PO Take by mouth.   Yes Historical Provider, MD  Coenzyme Q10 (COQ10) 200 MG CAPS Take by mouth.   Yes Historical Provider, MD  fluticasone (FLONASE) 50 MCG/ACT nasal spray Place 2 sprays into both nostrils daily as needed.    Yes Historical Provider, MD  glucosamine-chondroitin 500-400 MG tablet Take 1 tablet by mouth daily.   Yes Historical Provider, MD  hydrochlorothiazide (HYDRODIURIL) 25 MG tablet Take 1 tablet (25 mg total) by mouth daily. 12/03/15  Yes Guadalupe Maple, MD  Multiple Vitamin (MULTIVITAMIN) tablet Take 1 tablet by mouth daily.   Yes Historical Provider, MD  nitroGLYCERIN (NITROSTAT) 0.4 MG SL tablet Place 0.4 mg under the tongue every 5 (five) minutes as needed for chest pain.  Yes Historical Provider, MD  pantoprazole (PROTONIX) 40 MG tablet Take 40 mg by mouth daily.   Yes Historical Provider, MD  Turmeric Curcumin 500 MG CAPS Take by mouth daily.   Yes Historical Provider, MD  vitamin A 8000 UNIT capsule Take 8,000 Units by mouth daily.   Yes Historical Provider, MD  vitamin B-12 (CYANOCOBALAMIN) 500 MCG tablet Take 500 mcg by mouth daily.   Yes Historical Provider, MD  vitamin E 400 UNIT capsule Take 400 Units by mouth daily.   Yes Historical Provider, MD  ondansetron (ZOFRAN ODT) 4 MG disintegrating tablet Take  1 tablet (4 mg total) by mouth every 8 (eight) hours as needed for nausea or vomiting. 02/18/16   Gregor Hams, MD  oxyCODONE-acetaminophen (ROXICET) 5-325 MG tablet Take 1 tablet by mouth every 4 (four) hours as needed for severe pain. 02/18/16   Gregor Hams, MD    Allergies Scopolamine; Amlodipine; Amlodipine; Benazepril; Codeine; Crestor [rosuvastatin]; Lovastatin; Scopolamine; Statins; and Zocor [simvastatin]  Family History  Problem Relation Age of Onset  . Hypertension Mother   . Heart failure Mother   . Hyperlipidemia Mother   . Cancer Sister   . Heart disease Sister   . Hypertension Sister   . Diabetes Brother   . Heart disease Brother     pacer/defiib  . Hypertension Brother   . Heart attack Brother   . Heart disease Brother     CABG    Social History Social History  Substance Use Topics  . Smoking status: Former Smoker    Packs/day: 3.00    Years: 23.00    Types: Cigarettes    Quit date: 04/15/1978  . Smokeless tobacco: Never Used  . Alcohol use No    Review of Systems Constitutional: No fever/chills. No recent illness.  Cardiovascular: No palpitations Respiratory: Denies shortness of breath. Gastrointestinal:  No diarrhea.  No constipation. Genitourinary: Negative for dysuria. Musculoskeletal: Negative for muscle aches. Skin: Negative for rash. Neurological: Negative for headaches, focal weakness or numbness.  ____________________________________________   PHYSICAL EXAM:  VITAL SIGNS: ED Triage Vitals  Enc Vitals Group     BP 02/18/16 0132 (!) 153/90     Pulse Rate 02/18/16 0132 78     Resp 02/18/16 0132 18     Temp 02/18/16 0132 98.4 F (36.9 C)     Temp Source 02/18/16 0132 Oral     SpO2 02/18/16 0132 97 %     Weight 02/18/16 0131 180 lb (81.6 kg)     Height 02/18/16 0131 5\' 5"  (1.651 m)     Head Circumference --      Peak Flow --      Pain Score 02/18/16 0131 9     Pain Loc --      Pain Edu? --      Excl. in [place]? --      Constitutional: Alert and oriented. Well appearing and in no acute distress. Eyes: Conjunctivae are normal.  Head: Atraumatic. Nose: No congestion/rhinnorhea. Mouth/Throat: Mucous membranes are moist.   Cardiovascular: Normal rate, regular rhythm. Grossly normal heart sounds.  Good peripheral circulation. Respiratory: Normal respiratory effort.  No retractions. Lungs CTAB. Gastrointestinal: Soft. Moderate epigastric tenderness. Mild tenderness in all four quadrants. No distention. No abdominal bruits. No CVA tenderness. No back tenderness.  Musculoskeletal: No lower extremity tenderness nor edema.  No joint effusions. Neurologic:  Normal speech and language. No gross focal neurologic deficits are appreciated.  Skin:  Skin is warm, dry and intact. No  rash noted. Psychiatric: Mood and affect are normal.   ____________________________________________   LABS (all labs ordered are listed, but only abnormal results are displayed)  Labs Reviewed  BASIC METABOLIC PANEL - Abnormal; Notable for the following:       Result Value   Sodium 134 (*)    Potassium 3.4 (*)    Chloride 99 (*)    Glucose, Bld 168 (*)    All other components within normal limits  CBC - Abnormal; Notable for the following:    WBC 13.0 (*)    All other components within normal limits  TROPONIN I   ____________________________________________    PROCEDURES  Procedure(s) performed: None  Procedures  Critical Care performed: No  ____________________________________________   INITIAL IMPRESSION / ASSESSMENT AND PLAN / ED COURSE  Pertinent labs & imaging results that were available during my care of the patient were reviewed by me and considered in my medical decision making (see chart for details).  Assessment: Calculus of gallbladder without cholecystitis without obstruction  Clinical Course    Gallstones were seen on CT with no evidence of inflammation. Patient does not have fever and is not  tachycardic. Blood pressure is likely high due to pain.  Patient was given Maalox and Zofran for nausea and Percocet for pain. Chest xray did not show cardiopulmonary disease. No evidence of aortic dissection, diverticulitis, ulcer, STEMI, PE, pneumothorax, mesenteric ischemia, bowel obstruction.   ____________________________________________   FINAL CLINICAL IMPRESSION(S) / ED DIAGNOSES  Final diagnoses:  Calculus of gallbladder without cholecystitis without obstruction      NEW MEDICATIONS STARTED DURING THIS VISIT:  New Prescriptions   ONDANSETRON (ZOFRAN ODT) 4 MG DISINTEGRATING TABLET    Take 1 tablet (4 mg total) by mouth every 8 (eight) hours as needed for nausea or vomiting.   OXYCODONE-ACETAMINOPHEN (ROXICET) 5-325 MG TABLET    Take 1 tablet by mouth every 4 (four) hours as needed for severe pain.     Note:  This document was prepared using Dragon voice recognition software and may include unintentional dictation errors.   Laban Emperor, PA-C 02/18/16 [place], MD 02/18/16 [phone]

## 2016-02-18 NOTE — Telephone Encounter (Signed)
Referral

## 2016-02-18 NOTE — ED Triage Notes (Addendum)
Patient ambulatory to triage with steady gait, without difficulty or distress noted; pt reports mid sternal pain radiating into back and abd with no accomp symptoms since yesterday afternoon; st hx of same with reflux

## 2016-02-18 NOTE — Telephone Encounter (Signed)
Routing to provider  

## 2016-02-18 NOTE — Telephone Encounter (Signed)
Pt called stated she was seen in the ER, pt has gall stones, pt would like a referral sent to [place] with Dr. Coral Ceo. Please call pt with any questions. Thanks.

## 2016-02-18 NOTE — ED Notes (Signed)
2 unsuccessful PIV attempts by this RN (left hand and left forearm). Seth Bake, RN, in to attempt PIV placement at this time.

## 2016-02-18 NOTE — ED Notes (Signed)
Pt reports centralized chest/epigastric pain since 230 pm Monday afternoon with nausea and vomiting. Pt reports taking OTC Tylenol and chewable Alka-Seltzerm but vomited both up after ingestion. Pt reports h/x of GERD with compliance to prescribed medications. Pt reports pain decreases when lying flat.

## 2016-02-19 NOTE — Telephone Encounter (Signed)
Scheduled and authorized with Dr. Azalee Course.   Authorization [id number]

## 2016-02-24 ENCOUNTER — Other Ambulatory Visit
Admission: RE | Admit: 2016-02-24 | Discharge: 2016-02-24 | Disposition: A | Discharge status: Home / Self Care | Payer: Commercial Managed Care - HMO | Source: Ambulatory Visit | Attending: Surgery | Admitting: Surgery

## 2016-02-24 ENCOUNTER — Encounter: Payer: Self-pay | Admitting: Surgery

## 2016-02-24 ENCOUNTER — Ambulatory Visit (INDEPENDENT_AMBULATORY_CARE_PROVIDER_SITE_OTHER): Payer: Commercial Managed Care - HMO | Admitting: Surgery

## 2016-02-24 ENCOUNTER — Ambulatory Visit: Payer: Self-pay | Admitting: Surgery

## 2016-02-24 VITALS — BP 125/61 | HR 68 | Temp 98.1°F | Ht 65.0 in | Wt 181.0 lb

## 2016-02-24 DIAGNOSIS — Z79899 Other long term (current) drug therapy: Secondary | ICD-10-CM | POA: Insufficient documentation

## 2016-02-24 DIAGNOSIS — Z885 Allergy status to narcotic agent status: Secondary | ICD-10-CM | POA: Insufficient documentation

## 2016-02-24 DIAGNOSIS — E785 Hyperlipidemia, unspecified: Secondary | ICD-10-CM | POA: Insufficient documentation

## 2016-02-24 DIAGNOSIS — R110 Nausea: Secondary | ICD-10-CM | POA: Diagnosis not present

## 2016-02-24 DIAGNOSIS — K8020 Calculus of gallbladder without cholecystitis without obstruction: Secondary | ICD-10-CM | POA: Insufficient documentation

## 2016-02-24 DIAGNOSIS — R109 Unspecified abdominal pain: Secondary | ICD-10-CM | POA: Diagnosis not present

## 2016-02-24 DIAGNOSIS — K219 Gastro-esophageal reflux disease without esophagitis: Secondary | ICD-10-CM | POA: Insufficient documentation

## 2016-02-24 DIAGNOSIS — I10 Essential (primary) hypertension: Secondary | ICD-10-CM | POA: Diagnosis not present

## 2016-02-24 DIAGNOSIS — M549 Dorsalgia, unspecified: Secondary | ICD-10-CM | POA: Insufficient documentation

## 2016-02-24 DIAGNOSIS — Z833 Family history of diabetes mellitus: Secondary | ICD-10-CM | POA: Insufficient documentation

## 2016-02-24 DIAGNOSIS — Z8249 Family history of ischemic heart disease and other diseases of the circulatory system: Secondary | ICD-10-CM | POA: Insufficient documentation

## 2016-02-24 DIAGNOSIS — Z823 Family history of stroke: Secondary | ICD-10-CM | POA: Diagnosis not present

## 2016-02-24 DIAGNOSIS — R319 Hematuria, unspecified: Secondary | ICD-10-CM | POA: Diagnosis not present

## 2016-02-24 DIAGNOSIS — Z888 Allergy status to other drugs, medicaments and biological substances status: Secondary | ICD-10-CM | POA: Insufficient documentation

## 2016-02-24 DIAGNOSIS — K224 Dyskinesia of esophagus: Secondary | ICD-10-CM | POA: Diagnosis not present

## 2016-02-24 DIAGNOSIS — R1013 Epigastric pain: Secondary | ICD-10-CM | POA: Diagnosis not present

## 2016-02-24 DIAGNOSIS — Z87891 Personal history of nicotine dependence: Secondary | ICD-10-CM | POA: Diagnosis not present

## 2016-02-24 DIAGNOSIS — R509 Fever, unspecified: Secondary | ICD-10-CM | POA: Insufficient documentation

## 2016-02-24 DIAGNOSIS — R310 Gross hematuria: Secondary | ICD-10-CM | POA: Diagnosis not present

## 2016-02-24 LAB — URINALYSIS COMPLETE WITH MICROSCOPIC (ARMC ONLY)
Glucose, UA: NEGATIVE mg/dL
Hgb urine dipstick: NEGATIVE
Ketones, ur: NEGATIVE mg/dL
Nitrite: NEGATIVE
PH: 7 (ref 5.0–8.0)
RBC / HPF: NONE SEEN RBC/hpf (ref 0–5)
Specific Gravity, Urine: 1.015 (ref 1.005–1.030)

## 2016-02-24 LAB — COMPREHENSIVE METABOLIC PANEL
ALBUMIN: 3.4 g/dL — AB (ref 3.5–5.0)
ALT: 41 U/L (ref 14–54)
AST: 39 U/L (ref 15–41)
Alkaline Phosphatase: 116 U/L (ref 38–126)
Anion gap: 11 (ref 5–15)
BUN: 23 mg/dL — AB (ref 6–20)
CHLORIDE: 95 mmol/L — AB (ref 101–111)
CO2: 28 mmol/L (ref 22–32)
CREATININE: 0.98 mg/dL (ref 0.44–1.00)
Calcium: 8.6 mg/dL — ABNORMAL LOW (ref 8.9–10.3)
GFR calc Af Amer: >60 mL/min (ref >60)
GFR, EST NON AFRICAN AMERICAN: 52 mL/min — AB (ref >60)
GLUCOSE: 122 mg/dL — AB (ref 65–99)
POTASSIUM: 3.3 mmol/L — AB (ref 3.5–5.1)
Sodium: 134 mmol/L — ABNORMAL LOW (ref 135–145)
Total Bilirubin: 1 mg/dL (ref 0.3–1.2)
Total Protein: 7.2 g/dL (ref 6.5–8.1)

## 2016-02-24 LAB — CBC WITH DIFFERENTIAL/PLATELET
BASOS ABS: 0.1 10*3/uL (ref 0–0.1)
BASOS PCT: 0 %
EOS PCT: 2 %
Eosinophils Absolute: 0.2 10*3/uL (ref 0–0.7)
HEMATOCRIT: 38.1 % (ref 35.0–47.0)
Hemoglobin: 12.6 g/dL (ref 12.0–16.0)
LYMPHS PCT: 16 %
Lymphs Abs: 2.4 10*3/uL (ref 1.0–3.6)
MCH: 29.4 pg (ref 26.0–34.0)
MCHC: 33 g/dL (ref 32.0–36.0)
MCV: 89.3 fL (ref 80.0–100.0)
MONO ABS: 1.3 10*3/uL — AB (ref 0.2–0.9)
Monocytes Relative: 9 %
NEUTROS ABS: 10.8 10*3/uL — AB (ref 1.4–6.5)
Neutrophils Relative %: 73 %
PLATELETS: 273 10*3/uL (ref 150–440)
RBC: 4.27 MIL/uL (ref 3.80–5.20)
RDW: 13 % (ref 11.5–14.5)
WBC: 14.8 10*3/uL — AB (ref 3.6–11.0)

## 2016-02-24 NOTE — Progress Notes (Signed)
Subjective:     Patient ID: Deborah Gibson, female   DOB: Jan 07, 1932, 82 y.o.   MRN: [id number]  HPI  82 year old female that comes in today with multiple complaints. The patient was recently seen in the emergency department on 11/21 for this abdominal epigastric pain. The patient describes the pain as epigastric pain and points to her chest area as well for the pain. She describes the pain as I did not type feeling on the inside and states that is spread out on both sides and that she is also had some back pain more on the left than on the right. The patient also endorses fatigue and having hematuria since being seen in the emergency department. The patient states prior to coming to the emergency department that day she had eaten some chocolate and then began having the pain about 30 minutes to an hour later. Patient stated that the pain came on suddenly and continued. The patient then experienced severe nausea and 4 episodes of vomiting prior to going to the emergency department. The patient states that she has continued to have some of the pain in this area and that it has not been relieved. The patient has not endorsed any fevers or chills any further nausea or vomiting she has endorsed some increase in flatulence and burping and acid reflux as well as bloating in her abdomen. The patient is also noted some constipation but denies any diarrhea or change in her stools any blood in her stools. The patient has noted hematuria but denies any pain or burning with urination or foul-smelling urine she also has noticed an increase in pain in the left side of her back.  Past Medical History:  Diagnosis Date  . Arthritis   . Cancer ([place])   . GERD (gastroesophageal reflux disease)   . Hyperlipidemia   . Hypertension   . Seasonal allergies    Past Surgical History:  Procedure Laterality Date  . ABDOMINAL HYSTERECTOMY    . BLADDER SUSPENSION    . [place], [place]  .  CATARACT EXTRACTION    . CORONARY ANGIOPLASTY  2016  . CYSTOCELE REPAIR    . EYE SURGERY     cataract surgery  . rectal vaginal fistula repair    . TONSILLECTOMY    . VAGINAL HYSTERECTOMY     Family History  Problem Relation Age of Onset  . Hypertension Mother   . Heart failure Mother   . Hyperlipidemia Mother   . Stroke Mother   . Cancer Sister   . Heart disease Sister   . Hypertension Sister   . Heart disease Father   . Diabetes Brother   . Heart disease Brother     pacer/defiib  . Hypertension Brother   . Heart attack Brother   . Heart disease Brother     CABG  . Leukemia Daughter    Social History   Social History  . Marital status: Married    Spouse name: N/A  . Number of children: N/A  . Years of education: N/A   Social History Main Topics  . Smoking status: Former Smoker    Packs/day: 3.00    Years: 23.00    Types: Cigarettes    Quit date: 04/15/1978  . Smokeless tobacco: Never Used  . Alcohol use No  . Drug use: No  . Sexual activity: Not Asked   Other Topics Concern  . None   Social History Narrative   ** Merged  History Encounter **        Current Outpatient Prescriptions:  .  carvedilol (COREG) 25 MG tablet, Take 25 mg by mouth 2 (two) times daily with a meal. , Disp: , Rfl:  .  cetirizine (ZYRTEC) 10 MG tablet, Take 10 mg by mouth daily., Disp: , Rfl:  .  Coenzyme Q10 (COQ10) 200 MG CAPS, Take by mouth., Disp: , Rfl:  .  fluticasone (FLONASE) 50 MCG/ACT nasal spray, Place 2 sprays into both nostrils daily as needed. , Disp: , Rfl:  .  hydrochlorothiazide (HYDRODIURIL) 25 MG tablet, Take 1 tablet (25 mg total) by mouth daily., Disp: 90 tablet, Rfl: 4 .  ondansetron (ZOFRAN ODT) 4 MG disintegrating tablet, Take 1 tablet (4 mg total) by mouth every 8 (eight) hours as needed for nausea or vomiting., Disp: 20 tablet, Rfl: 0 .  pantoprazole (PROTONIX) 40 MG tablet, Take 40 mg by mouth daily., Disp: , Rfl:  .  vitamin B-12 (CYANOCOBALAMIN) 500 MCG  tablet, Take 500 mcg by mouth daily., Disp: , Rfl:  Allergies  Allergen Reactions  . Scopolamine Anaphylaxis  . Amlodipine   . Amlodipine Swelling  . Benazepril Cough  . Codeine     Sick on stomach   . Crestor [Rosuvastatin] Other (See Comments)    myalgias  . Lovastatin Other (See Comments)    Myalgias   . Scopolamine   . Statins     Legs ache   . Zocor [Simvastatin] Other (See Comments)    myalgias    Review of Systems  Constitutional: Positive for activity change, appetite change and fatigue. Negative for chills, diaphoresis, fever and unexpected weight change.  HENT: Negative for congestion and sore throat.   Respiratory: Positive for shortness of breath. Negative for cough, chest tightness and wheezing.   Cardiovascular: Positive for chest pain. Negative for palpitations and leg swelling.  Gastrointestinal: Positive for abdominal distention, abdominal pain, constipation, nausea and vomiting. Negative for anal bleeding, blood in stool, diarrhea and rectal pain.  Genitourinary: Positive for difficulty urinating, dysuria, enuresis, flank pain, frequency, hematuria and urgency. Negative for dyspareunia, genital sores, menstrual problem, pelvic pain and vaginal discharge.  Musculoskeletal: Positive for back pain, joint swelling, neck pain and neck stiffness. Negative for arthralgias, gait problem and myalgias.  Skin: Negative for color change, pallor, rash and wound.  Neurological: Negative for dizziness, syncope and weakness.  Hematological: Negative for adenopathy. Does not bruise/bleed easily.  Psychiatric/Behavioral: Negative for agitation. The patient is not nervous/anxious.   All other systems reviewed and are negative.      Vitals:   02/24/16 1507  BP: 125/61  Pulse: 68  Temp: 98.1 F (36.7 C)    Objective:   Physical Exam  Constitutional: She is oriented to person, place, and time. She appears well-developed and well-nourished. No distress.  Appears tired but  younger than her stated age  HENT:  Head: Normocephalic and atraumatic.  Right Ear: External ear normal.  Left Ear: External ear normal.  Nose: Nose normal.  Mouth/Throat: Oropharynx is clear and moist. No oropharyngeal exudate.  Eyes: Conjunctivae and EOM are normal. No scleral icterus.  Neck: Normal range of motion. Neck supple. No tracheal deviation present.  Cardiovascular: Normal rate, regular rhythm, normal heart sounds and intact distal pulses.  Exam reveals no gallop and no friction rub.   No murmur heard. Pulmonary/Chest: Effort normal and breath sounds normal. No respiratory distress. She has no wheezes. She has no rales.  Abdominal: Soft. Bowel sounds are normal. She  exhibits no distension and no mass. There is tenderness. There is no rebound and no guarding.  Mild epigastric tenderness, left CVA tenderness  Musculoskeletal: Normal range of motion. She exhibits no edema, tenderness or deformity.  Neurological: She is alert and oriented to person, place, and time. No cranial nerve deficit.  Skin: Skin is warm and dry. No rash noted. No erythema. No pallor.  Psychiatric: She has a normal mood and affect. Her behavior is normal. Judgment and thought content normal.  Vitals reviewed.      CBC Latest Ref Rng & Units 02/24/2016 02/18/2016 12/03/2015  WBC 3.6 - 11.0 K/uL 14.8(H) 13.0(H) 6.8  Hemoglobin 12.0 - 16.0 g/dL 12.6 13.8 -  Hematocrit 35.0 - 47.0 % 38.1 39.9 38.5  Platelets 150 - 440 K/uL 273 191 183   CMP Latest Ref Rng & Units 02/24/2016 02/18/2016 12/03/2015  Glucose 65 - 99 mg/dL 122(H) 168(H) 93  BUN 6 - 20 mg/dL 23(H) 17 20  Creatinine 0.44 - 1.00 mg/dL 0.98 0.79 1.06(H)  Sodium 135 - 145 mmol/L 134(L) 134(L) 136  Potassium 3.5 - 5.1 mmol/L 3.3(L) 3.4(L) 3.9  Chloride 101 - 111 mmol/L 95(L) 99(L) 94(L)  CO2 22 - 32 mmol/L 28 25 26   Calcium 8.9 - 10.3 mg/dL 8.6(L) 9.7 9.4  Total Protein 6.5 - 8.1 g/dL 7.2 - 7.2  Total Bilirubin 0.3 - 1.2 mg/dL 1.0 - 0.8  Alkaline  Phos 38 - 126 U/L 116 - 97  AST 15 - 41 U/L 39 - 36  ALT 14 - 54 U/L 41 - 30   U/A: pending  CT scan: IMPRESSION: No acute process demonstrated in the abdomen or pelvis. Cholelithiasis without evidence of inflammation or ductal dilatation. Fatty infiltration of the liver. Thickening of the distal esophageal wall could indicate reflux disease.  Assessment:     82 year old female with epigastric abdominal pain and concern for esophageal dysmotility, GERD and cholelithiasis as well as possible UTI.     Plan:     I have personally reviewed the patient's past medical history which is quite extensive and includes well-controlled hypertension on Coreg and hydrochlorothiazide, coronary artery disease with previous heart catheterizations, esophageal dysmotility and GERD that she is on Protonix for and has had previous EGDs by Dr. Dondra Prader back in 2011.  I have personally reviewed the patient's laboratory values which were performed on 1121 and she did have elevated white blood cell count at 13 and some abnormalities in her electrolytes however no liver enzymes were performed. I now have the results from today's labs to do still show an elevated white blood cell count of 14 and her liver enzymes are all within normal limits with a normal alkaline phosphatase as well. I also personally reviewed her CT scan images which did show one stone in the gallbladder without any fluid inflammation or indication of acute cholecystitis, however did have some thickening of the esophagus near the GE junction which did have some slig    ht stranding around it. Also reviewed the radiology read is posted above.  For her epigastric pain I am uncertain whether this is an increase in her GERD/esophageal dysmotility  versus a symptomatic cholelithiasis. Since the patient has normal liver enzymes and only one stone in her gallbladder without any inflammation around it but does show some thickening of her esophagus on her CT scan I  would like to have her evaluated by a gastroenterologist with a repeat EGD to ensure no further changes to her esophagus and that  the pain she is having is a coming from GERD.  However, if her EGD does not show further esophageal disease and Dr. Vicente Males of  GI feels this is more related to her gallbladder, I am willing to perform a laparoscopic cholecystectomy on her. I would need her to follow-up after her EGD depending on the findings of this.  For her hematuria and back pain as well as her fever chills and nausea, I am concerned that she may have a urinary tract infection. She did have a white blood cell count of 13 and now it is elevated to 14 on her most recent labs today. I have also ordered a UA and a culture and if either of these are positive, I will be sending her to her primary care physician Dr. Janae Bridgeman to attempt to get treatment for this.  I have called and spoken to her son Geraldean Guillemette per the patient and son request because she was unable to relay the information from the visit.  The patient and son were given the opportunity to ask questions and have them answered.  They are in agreement with the plan as stated above.

## 2016-02-24 NOTE — Patient Instructions (Signed)
We will send you for Labs and Urine testing today downstairs. We will call you with the results to this testing as soon as this comes back. It generally takes 72 hours for the urine testing.  We will have you see Dr. Vicente Males (Gastroenterologist) as scheduled below.

## 2016-02-25 ENCOUNTER — Telehealth: Payer: Self-pay

## 2016-02-25 MED ORDER — SULFAMETHOXAZOLE-TRIMETHOPRIM 800-160 MG PO TABS: 1.0000 | ORAL_TABLET | Freq: Two times a day (BID) | ORAL | 0 refills | Status: AC

## 2016-02-25 NOTE — Telephone Encounter (Signed)
Dr. Azalee Course wanted me to call patient to let her know that she had an urinary tract infection. Therefore, she needed to start taking Bactrim (an antibiotic). Dr. Azalee Course also wanted me to schedule an appointment for her with her primary care doctor. I scheduled her appointment to be seen on 03/05/2016 at 10:45 AM with Dr. Jeananne Rama.  Patient was called and informed her that she needs to start taking an antibiotic and about her appointment with Dr. Jeananne Rama. Patient agreed and stated that she would see him and start her antibiotic.

## 2016-02-25 NOTE — Telephone Encounter (Signed)
Results of UA and labs reviewed. Spoke with Dr. Azalee Course regarding this. Patient will be started on Bactrim DS and instructed to follow-up with PCP in regards to UTI. Will await UCS for further orders if needed. UCS is currently in process.

## 2016-02-26 LAB — URINE CULTURE: CULTURE: NO GROWTH

## 2016-03-03 ENCOUNTER — Encounter: Payer: Self-pay | Admitting: Gastroenterology

## 2016-03-03 ENCOUNTER — Ambulatory Visit (INDEPENDENT_AMBULATORY_CARE_PROVIDER_SITE_OTHER): Payer: Commercial Managed Care - HMO | Admitting: Gastroenterology

## 2016-03-03 VITALS — BP 133/64 | HR 66 | Temp 98.6°F | Ht 65.0 in | Wt 176.0 lb

## 2016-03-03 DIAGNOSIS — G8929 Other chronic pain: Secondary | ICD-10-CM

## 2016-03-03 DIAGNOSIS — R1013 Epigastric pain: Secondary | ICD-10-CM

## 2016-03-03 DIAGNOSIS — R9389 Abnormal findings on diagnostic imaging of other specified body structures: Secondary | ICD-10-CM

## 2016-03-03 DIAGNOSIS — R938 Abnormal findings on diagnostic imaging of other specified body structures: Secondary | ICD-10-CM | POA: Diagnosis not present

## 2016-03-03 NOTE — Patient Instructions (Signed)
Please refer below for your follow up appointment.   Please call us if you have any questions or concerns.

## 2016-03-03 NOTE — Progress Notes (Signed)
Gastroenterology Consultation  Referring Provider:     Guadalupe Maple, MD Primary Care Physician:  Golden Pop, MD Primary Gastroenterologist:  Dr. Jonathon Bellows  Reason for Consultation:     GERD        HPI:   Deborah Gibson is a 82 y.o. y/o female referred for consultation & management  by Dr. Golden Pop, MD.  She was recently seen by Dr Azalee Course for epigastric pain , The patient had a CT scan ofher abdomen and it showed some thickening of the esophagus possibly related to reflux, stone at the neck of the gall bladder and felt an endoscopy was rightly warranted. There has been no abnormalities of the liver function tests, no features suggestive of acute cholecystitis.    Abdominal pain: Onset: for years, on and off, relieved with protonoix, she has a second type of pain which occurred just on one occasion recently and another episode 2 years back when she felt a strap around her abdomen , it came on all of a sudden , lasted about 6 hours, she did have nausea and some vomiting . Not recurred since  Site :chest discomfort is on and off, worse when she walks Gall bladder surgery: intact   She has an EGD in the past which was normal   Overall she says that all her chest symptoms are relieved with Protonix.  She says in addition she has a different pain in the upper part of her abdomen for the past 1 year, on and off, worse when she walks, occurs everytime she walks up a hill. She says she has been evaluated by a cardiologist for the same and was told everything was ok. She says she has lost 10 lbs over the past few weeks. She is an ex smoker. Denies any NSAID use. +   Past Medical History:  Diagnosis Date  . Arthritis   . Cancer ([place])   . GERD (gastroesophageal reflux disease)   . Hyperlipidemia   . Hypertension   . Seasonal allergies     Past Surgical History:  Procedure Laterality Date  . ABDOMINAL HYSTERECTOMY    . BLADDER SUSPENSION    . [place], [place]  . CATARACT EXTRACTION    . CORONARY ANGIOPLASTY  2016  . CYSTOCELE REPAIR    . EYE SURGERY     cataract surgery  . rectal vaginal fistula repair    . TONSILLECTOMY    . VAGINAL HYSTERECTOMY      Prior to Admission medications   Medication Sig Start Date End Date Taking? Authorizing Provider  aspirin EC 81 MG tablet Take 81 mg by mouth daily.   Yes Historical Provider, MD  carvedilol (COREG) 25 MG tablet Take 25 mg by mouth 2 (two) times daily with a meal.  03/29/14  Yes Historical Provider, MD  cetirizine (ZYRTEC) 10 MG tablet Take 10 mg by mouth daily.   Yes Historical Provider, MD  Coenzyme Q10 (COQ10) 200 MG CAPS Take by mouth.   Yes Historical Provider, MD  fluticasone (FLONASE) 50 MCG/ACT nasal spray Place 2 sprays into both nostrils daily as needed.    Yes Historical Provider, MD  hydrochlorothiazide (HYDRODIURIL) 25 MG tablet Take 1 tablet (25 mg total) by mouth daily. 12/03/15  Yes Guadalupe Maple, MD  pantoprazole (PROTONIX) 40 MG tablet Take 40 mg by mouth daily.   Yes Historical Provider, MD  sulfamethoxazole-trimethoprim (BACTRIM DS,SEPTRA DS) 800-160 MG tablet Take 1 tablet by mouth  2 (two) times daily. 02/25/16 03/03/16 Yes Hubbard Robinson, MD  vitamin B-12 (CYANOCOBALAMIN) 500 MCG tablet Take 500 mcg by mouth daily.   Yes Historical Provider, MD    Family History  Problem Relation Age of Onset  . Hypertension Mother   . Heart failure Mother   . Hyperlipidemia Mother   . Stroke Mother   . Cancer Sister   . Heart disease Sister   . Hypertension Sister   . Heart disease Father   . Diabetes Brother   . Heart disease Brother     pacer/defiib  . Hypertension Brother   . Heart attack Brother   . Heart disease Brother     CABG  . Leukemia Daughter      Social History  Substance Use Topics  . Smoking status: Former Smoker    Packs/day: 3.00    Years: 23.00    Types: Cigarettes    Quit date: 04/15/1978  . Smokeless tobacco: Never Used  .  Alcohol use No    Allergies as of 03/03/2016 - Review Complete 03/03/2016  Allergen Reaction Noted  . Scopolamine Anaphylaxis 05/01/2014  . Amlodipine  05/01/2014  . Amlodipine Swelling 11/22/2014  . Benazepril Cough 11/22/2014  . Codeine  05/01/2014  . Crestor [rosuvastatin] Other (See Comments) 11/22/2014  . Lovastatin Other (See Comments) 11/22/2014  . Scopolamine  11/22/2014  . Statins  05/01/2014  . Zocor [simvastatin] Other (See Comments) 11/22/2014    Review of Systems:    All systems reviewed and negative except where noted in HPI.   Physical Exam:  BP 133/64   Pulse 66   Temp 98.6 F (37 C) (Oral)   Ht 5\' 5"  (1.651 m)   Wt 176 lb (79.8 kg)   BMI 29.29 kg/m  No LMP recorded. Patient has had a hysterectomy. Psych:  Alert and cooperative. Normal mood and affect. General:   Alert,  Well-developed, well-nourished, pleasant and cooperative in NAD Head:  Normocephalic and atraumatic. Eyes:  Sclera clear, no icterus.   Conjunctiva pink. Ears:  Normal auditory acuity. Nose:  No deformity, discharge, or lesions. Mouth:  No deformity or lesions,oropharynx pink & moist. Neck:  Supple; no masses or thyromegaly. Lungs:  Respirations even and unlabored.  Clear throughout to auscultation.   No wheezes, crackles, or rhonchi. No acute distress. Heart:  Regular rate and rhythm; no murmurs, clicks, rubs, or gallops. Abdomen:  Normal bowel sounds.  No bruits.  Soft, non-tender and non-distended without masses, hepatosplenomegaly or hernias noted.  No guarding or rebound tenderness.    Psych:  Alert and cooperative. Normal mood and affect.  Imaging Studies: Dg Chest 2 View  Result Date: 02/18/2016 CLINICAL DATA:  Midsternal pain EXAM: CHEST  2 VIEW COMPARISON:  Chest radiograph 05/01/2014 FINDINGS: The lungs are well inflated. There is atherosclerotic calcification within the aortic arch. Otherwise, the cardiomediastinal contours are normal. There are diffusely increased pulmonary  markings, unchanged. No focal airspace consolidation or pulmonary edema. No pleural effusion or pneumothorax. IMPRESSION: No active cardiopulmonary disease. Aortic atherosclerosis. Electronically Signed   By: Ulyses Jarred M.D.   On: 02/18/2016 01:58   Ct Abdomen Pelvis W Contrast  Result Date: 02/18/2016 CLINICAL DATA:  Midsternal pain radiating to the back into the abdomen since yesterday. EXAM: CT ABDOMEN AND PELVIS WITH CONTRAST TECHNIQUE: Multidetector CT imaging of the abdomen and pelvis was performed using the standard protocol following bolus administration of intravenous contrast. CONTRAST:  167mL ISOVUE-300 IOPAMIDOL (ISOVUE-300) INJECTION 61% COMPARISON:  None. FINDINGS: Lower  chest: Mild dependent changes in the lung bases. Minimal esophageal hiatal hernia with mild thickening of the lower esophageal wall possibly indicating reflux disease. Hepatobiliary: Diffuse fatty infiltration of the liver. No focal liver lesions. Stone in the gallbladder neck. No gallbladder wall thickening or edema. Bile ducts are not dilated. Pancreas: Unremarkable. No pancreatic ductal dilatation or surrounding inflammatory changes. Spleen: Calcified granulomas.  No splenomegaly. Adrenals/Urinary Tract: Adrenal glands are unremarkable. Kidneys are normal, without renal calculi, focal solid lesion, or hydronephrosis. Bladder is unremarkable. Stomach/Bowel: Stomach is within normal limits. Appendix appears normal. No evidence of bowel wall thickening, distention, or inflammatory changes. Vascular/Lymphatic: Aortic atherosclerosis. No enlarged abdominal or pelvic lymph nodes. Reproductive: Status post hysterectomy. No adnexal masses. Other: No abdominal wall hernia or abnormality. No abdominopelvic ascites. Musculoskeletal: Degenerative changes in the lumbar spine. No destructive bone lesions. IMPRESSION: No acute process demonstrated in the abdomen or pelvis. Cholelithiasis without evidence of inflammation or ductal  dilatation. Fatty infiltration of the liver. Thickening of the distal esophageal wall could indicate reflux disease. Electronically Signed   By: Lucienne Capers M.D.   On: 02/18/2016 06:57    Assessment and Plan:   Deborah Gibson is a 82 y.o. y/o female has been referred for an abnormal CT scan as well as abdominal pain. The patient is a difficult historian , has a few different pains that she describes. She was clear that the chest discomfort was relieved with use of pantoprazole suggesting very likely she has GERD. She also has some epigastric pain which is not biliary in nature but rather brought on by exercise which she says she has ben evaluated for.   She does have some thickening described of her esophagus on her CT scan which I will evaluate with an EGD especially since she has a history of smoking.   Follow up in 6 weeks  Dr Jonathon Bellows MD

## 2016-03-05 ENCOUNTER — Encounter: Payer: Self-pay | Admitting: Family Medicine

## 2016-03-05 ENCOUNTER — Ambulatory Visit (INDEPENDENT_AMBULATORY_CARE_PROVIDER_SITE_OTHER): Payer: Commercial Managed Care - HMO | Admitting: Family Medicine

## 2016-03-05 ENCOUNTER — Other Ambulatory Visit: Payer: Self-pay

## 2016-03-05 VITALS — BP 136/72 | Temp 98.0°F | Ht 65.5 in | Wt 174.6 lb

## 2016-03-05 DIAGNOSIS — R3989 Other symptoms and signs involving the genitourinary system: Secondary | ICD-10-CM | POA: Diagnosis not present

## 2016-03-05 DIAGNOSIS — E876 Hypokalemia: Secondary | ICD-10-CM

## 2016-03-05 DIAGNOSIS — I10 Essential (primary) hypertension: Secondary | ICD-10-CM | POA: Diagnosis not present

## 2016-03-05 LAB — MICROSCOPIC EXAMINATION: RBC, UA: NONE SEEN /hpf (ref 0–?)

## 2016-03-05 LAB — URINALYSIS, ROUTINE W REFLEX MICROSCOPIC
BILIRUBIN UA: NEGATIVE
GLUCOSE, UA: NEGATIVE
KETONES UA: NEGATIVE
Nitrite, UA: NEGATIVE
SPEC GRAV UA: 1.02 (ref 1.005–1.030)
UUROB: 2 mg/dL — AB (ref 0.2–1.0)
pH, UA: 5.5 (ref 5.0–7.5)

## 2016-03-05 NOTE — Assessment & Plan Note (Signed)
For discolored urine and history of cholelithiasis Will check urinalysis. Patient will bring in urine when it becomes discolored gave home urine cup to take home and bring back with discoloration.

## 2016-03-05 NOTE — Assessment & Plan Note (Signed)
Will check BMP today. 

## 2016-03-05 NOTE — Addendum Note (Signed)
Addended by: Sandria Manly on: 03/05/2016 02:32 PM   Modules accepted: Orders

## 2016-03-05 NOTE — Assessment & Plan Note (Signed)
The current medical regimen is effective;  continue present plan and medications.  

## 2016-03-05 NOTE — Progress Notes (Signed)
BP 136/72 (BP Location: Left Arm)   Temp 98 F (36.7 C)   Ht 5' 5.5" (1.664 m)   Wt 174 lb 9.6 oz (79.2 kg)   SpO2 99%   BMI 28.61 kg/m    Subjective:    Patient ID: Deborah Gibson, female    DOB: Aug 25, 1931, 82 y.o.   MRN: [id number]  HPI: Deborah Gibson is a 82 y.o. female  Chief Complaint  Patient presents with  . Follow-up  . Hypertension   Patient follow-up in process of workup for esophageal problems and/or gallstones. Reviewed surgeon's notes. Patient also had question of urinary tract infection with culture showing no growth. Patient does say urine is intermittently dark brown to red. Is not taking any supplements at this time doesn't have any urinary tract symptoms. Patient urine today is been clear yellow. Blood pressure good control no issues with current medications which she has been taking faithfully. Patient on lab review with hypokalemia patient's been taking extra potassium and salt.  Relevant past medical, surgical, family and social history reviewed and updated as indicated. Interim medical history since our last visit reviewed. Allergies and medications reviewed and updated.  Review of Systems  Constitutional: Negative.   Respiratory: Negative.   Cardiovascular: Negative.     Per HPI unless specifically indicated above     Objective:    BP 136/72 (BP Location: Left Arm)   Temp 98 F (36.7 C)   Ht 5' 5.5" (1.664 m)   Wt 174 lb 9.6 oz (79.2 kg)   SpO2 99%   BMI 28.61 kg/m   Wt Readings from Last 3 Encounters:  03/05/16 174 lb 9.6 oz (79.2 kg)  03/03/16 176 lb (79.8 kg)  02/24/16 181 lb (82.1 kg)    Physical Exam  Constitutional: She is oriented to person, place, and time. She appears well-developed and well-nourished. No distress.  HENT:  Head: Normocephalic and atraumatic.  Right Ear: Hearing normal.  Left Ear: Hearing normal.  Nose: Nose normal.  Eyes: Conjunctivae and lids are normal. Right eye exhibits no discharge. Left eye  exhibits no discharge. No scleral icterus.  Pulmonary/Chest: Effort normal. No respiratory distress.  Musculoskeletal: Normal range of motion.  Neurological: She is alert and oriented to person, place, and time.  Skin: Skin is intact. No rash noted.  Psychiatric: She has a normal mood and affect. Her speech is normal and behavior is normal. Judgment and thought content normal. Cognition and memory are normal.    Results for orders placed or performed during the hospital encounter of 02/24/16  Culture, Urine  Result Value Ref Range   Specimen Description URINE, CLEAN CATCH    Special Requests NONE    Culture NO GROWTH Performed at [facility]     Report Status 02/26/2016 FINAL   Urinalysis complete, with microscopic ([facility] only)  Result Value Ref Range   Color, Urine YELLOW YELLOW   APPearance CLEAR CLEAR   Glucose, UA NEGATIVE NEGATIVE mg/dL   Bilirubin Urine SMALL (A) NEGATIVE   Ketones, ur NEGATIVE NEGATIVE mg/dL   Specific Gravity, Urine 1.015 1.005 - 1.030   Hgb urine dipstick NEGATIVE NEGATIVE   pH 7.0 5.0 - 8.0   Protein, ur TRACE (A) NEGATIVE mg/dL   Nitrite NEGATIVE NEGATIVE   Leukocytes, UA SMALL (A) NEGATIVE   RBC / HPF NONE SEEN 0 - 5 RBC/hpf   WBC, UA 6-30 0 - 5 WBC/hpf   Bacteria, UA RARE (A) NONE SEEN   Squamous Epithelial /  LPF 6-30 (A) NONE SEEN  CBC with Differential/Platelet  Result Value Ref Range   WBC 14.8 (H) 3.6 - 11.0 K/uL   RBC 4.27 3.80 - 5.20 MIL/uL   Hemoglobin 12.6 12.0 - 16.0 g/dL   HCT 38.1 35.0 - 47.0 %   MCV 89.3 80.0 - 100.0 fL   MCH 29.4 26.0 - 34.0 pg   MCHC 33.0 32.0 - 36.0 g/dL   RDW 13.0 11.5 - 14.5 %   Platelets 273 150 - 440 K/uL   Neutrophils Relative % 73 %   Neutro Abs 10.8 (H) 1.4 - 6.5 K/uL   Lymphocytes Relative 16 %   Lymphs Abs 2.4 1.0 - 3.6 K/uL   Monocytes Relative 9 %   Monocytes Absolute 1.3 (H) 0.2 - 0.9 K/uL   Eosinophils Relative 2 %   Eosinophils Absolute 0.2 0 - 0.7 K/uL   Basophils Relative 0 %    Basophils Absolute 0.1 0 - 0.1 K/uL  Comprehensive metabolic panel  Result Value Ref Range   Sodium 134 (L) 135 - 145 mmol/L   Potassium 3.3 (L) 3.5 - 5.1 mmol/L   Chloride 95 (L) 101 - 111 mmol/L   CO2 28 22 - 32 mmol/L   Glucose, Bld 122 (H) 65 - 99 mg/dL   BUN 23 (H) 6 - 20 mg/dL   Creatinine, Ser 0.98 0.44 - 1.00 mg/dL   Calcium 8.6 (L) 8.9 - 10.3 mg/dL   Total Protein 7.2 6.5 - 8.1 g/dL   Albumin 3.4 (L) 3.5 - 5.0 g/dL   AST 39 15 - 41 U/L   ALT 41 14 - 54 U/L   Alkaline Phosphatase 116 38 - 126 U/L   Total Bilirubin 1.0 0.3 - 1.2 mg/dL   GFR calc non Af Amer 52 (L) >60 mL/min   GFR calc Af Amer >60 >60 mL/min   Anion gap 11 5 - 15      Assessment & Plan:   Problem List Items Addressed This Visit      Cardiovascular and Mediastinum   Essential hypertension - Primary    The current medical regimen is effective;  continue present plan and medications.       Relevant Orders   Basic metabolic panel     Other   Hypokalemia    Will check BMP today.      Urine discoloration    For discolored urine and history of cholelithiasis Will check urinalysis. Patient will bring in urine when it becomes discolored gave home urine cup to take home and bring back with discoloration.      Relevant Orders   Urinalysis, Routine w reflex microscopic       Follow up plan: Return if symptoms worsen or fail to improve, for As scheduled.

## 2016-03-06 LAB — BASIC METABOLIC PANEL
BUN / CREAT RATIO: 20 (ref 12–28)
BUN: 19 mg/dL (ref 8–27)
CHLORIDE: 96 mmol/L (ref 96–106)
CO2: 22 mmol/L (ref 18–29)
Calcium: 9.4 mg/dL (ref 8.7–10.3)
Creatinine, Ser: 0.95 mg/dL (ref 0.57–1.00)
GFR calc non Af Amer: 56 mL/min/{1.73_m2} — ABNORMAL LOW (ref >59)
GFR, EST AFRICAN AMERICAN: 65 mL/min/{1.73_m2} (ref >59)
Glucose: 104 mg/dL — ABNORMAL HIGH (ref 65–99)
POTASSIUM: 5.6 mmol/L — AB (ref 3.5–5.2)
SODIUM: 135 mmol/L (ref 134–144)

## 2016-03-09 ENCOUNTER — Telehealth: Payer: Self-pay | Admitting: Family Medicine

## 2016-03-09 DIAGNOSIS — E875 Hyperkalemia: Secondary | ICD-10-CM

## 2016-03-09 DIAGNOSIS — R300 Dysuria: Secondary | ICD-10-CM

## 2016-03-09 NOTE — Telephone Encounter (Signed)
Phone call Discussed with patient high potassium with 2 previous basic metabolic panel potassium showing low potassium. Patient not adjust and the potassium in her diet is taking hydrochlorothiazide otherwise no potassium manipulation from pills at or supplements  Will check BMP again  Patient's urine amber colored very concentrated with no real dysuria symptoms but concerned about dark colored urine reviewed specific gravity is high patient will drink more water and recheck urinalysis when comes in for BMP.

## 2016-03-10 ENCOUNTER — Other Ambulatory Visit: Payer: Commercial Managed Care - HMO

## 2016-03-10 DIAGNOSIS — R300 Dysuria: Secondary | ICD-10-CM

## 2016-03-10 DIAGNOSIS — E875 Hyperkalemia: Secondary | ICD-10-CM | POA: Diagnosis not present

## 2016-03-10 DIAGNOSIS — L821 Other seborrheic keratosis: Secondary | ICD-10-CM | POA: Diagnosis not present

## 2016-03-10 DIAGNOSIS — L570 Actinic keratosis: Secondary | ICD-10-CM | POA: Diagnosis not present

## 2016-03-10 DIAGNOSIS — Z85828 Personal history of other malignant neoplasm of skin: Secondary | ICD-10-CM | POA: Diagnosis not present

## 2016-03-11 ENCOUNTER — Encounter: Payer: Self-pay | Admitting: Family Medicine

## 2016-03-11 LAB — BASIC METABOLIC PANEL
BUN/Creatinine Ratio: 23 (ref 12–28)
BUN: 22 mg/dL (ref 8–27)
CHLORIDE: 98 mmol/L (ref 96–106)
CO2: 25 mmol/L (ref 18–29)
Calcium: 9.1 mg/dL (ref 8.7–10.3)
Creatinine, Ser: 0.94 mg/dL (ref 0.57–1.00)
GFR calc Af Amer: 65 mL/min/{1.73_m2} (ref >59)
GFR calc non Af Amer: 57 mL/min/{1.73_m2} — ABNORMAL LOW (ref >59)
GLUCOSE: 92 mg/dL (ref 65–99)
POTASSIUM: 4.8 mmol/L (ref 3.5–5.2)
SODIUM: 139 mmol/L (ref 134–144)

## 2016-03-12 ENCOUNTER — Encounter
Admission: RE | Disposition: A | Discharge status: Home / Self Care | Payer: Self-pay | Source: Ambulatory Visit | Attending: Gastroenterology

## 2016-03-12 ENCOUNTER — Ambulatory Visit: Payer: Commercial Managed Care - HMO | Admitting: Anesthesiology

## 2016-03-12 ENCOUNTER — Ambulatory Visit
Admission: RE | Admit: 2016-03-12 | Discharge: 2016-03-12 | Disposition: A | Discharge status: Home / Self Care | Payer: Commercial Managed Care - HMO | Source: Ambulatory Visit | Attending: Gastroenterology | Admitting: Gastroenterology

## 2016-03-12 ENCOUNTER — Encounter: Payer: Self-pay | Admitting: Anesthesiology

## 2016-03-12 DIAGNOSIS — E785 Hyperlipidemia, unspecified: Secondary | ICD-10-CM | POA: Diagnosis not present

## 2016-03-12 DIAGNOSIS — K2950 Unspecified chronic gastritis without bleeding: Secondary | ICD-10-CM | POA: Diagnosis not present

## 2016-03-12 DIAGNOSIS — Z87891 Personal history of nicotine dependence: Secondary | ICD-10-CM | POA: Insufficient documentation

## 2016-03-12 DIAGNOSIS — J449 Chronic obstructive pulmonary disease, unspecified: Secondary | ICD-10-CM | POA: Insufficient documentation

## 2016-03-12 DIAGNOSIS — Z7982 Long term (current) use of aspirin: Secondary | ICD-10-CM | POA: Insufficient documentation

## 2016-03-12 DIAGNOSIS — I10 Essential (primary) hypertension: Secondary | ICD-10-CM | POA: Diagnosis not present

## 2016-03-12 DIAGNOSIS — K228 Other specified diseases of esophagus: Secondary | ICD-10-CM | POA: Insufficient documentation

## 2016-03-12 DIAGNOSIS — K210 Gastro-esophageal reflux disease with esophagitis: Secondary | ICD-10-CM | POA: Diagnosis not present

## 2016-03-12 DIAGNOSIS — K219 Gastro-esophageal reflux disease without esophagitis: Secondary | ICD-10-CM | POA: Diagnosis not present

## 2016-03-12 DIAGNOSIS — R933 Abnormal findings on diagnostic imaging of other parts of digestive tract: Secondary | ICD-10-CM

## 2016-03-12 DIAGNOSIS — I2510 Atherosclerotic heart disease of native coronary artery without angina pectoris: Secondary | ICD-10-CM | POA: Diagnosis not present

## 2016-03-12 HISTORY — PX: ESOPHAGOGASTRODUODENOSCOPY (EGD) WITH PROPOFOL: SHX5813

## 2016-03-12 SURGERY — ESOPHAGOGASTRODUODENOSCOPY (EGD) WITH PROPOFOL
Anesthesia: General

## 2016-03-12 MED ORDER — IPRATROPIUM-ALBUTEROL 0.5-2.5 (3) MG/3ML IN SOLN
3.0000 mL | Freq: Once | RESPIRATORY_TRACT | Status: AC
  Administered 2016-03-12: 3 mL via RESPIRATORY_TRACT

## 2016-03-12 MED ORDER — PROPOFOL 10 MG/ML IV BOLUS
INTRAVENOUS | Status: DC | PRN
  Administered 2016-03-12: 20 mg via INTRAVENOUS
  Administered 2016-03-12: 30 mg via INTRAVENOUS

## 2016-03-12 MED ORDER — IPRATROPIUM-ALBUTEROL 0.5-2.5 (3) MG/3ML IN SOLN
RESPIRATORY_TRACT | Status: AC
  Administered 2016-03-12: 3 mL via RESPIRATORY_TRACT
  Filled 2016-03-12: qty 3

## 2016-03-12 MED ORDER — LIDOCAINE HCL (PF) 2 % IJ SOLN
INTRAMUSCULAR | Status: DC | PRN
  Administered 2016-03-12: 50 mg

## 2016-03-12 MED ORDER — SODIUM CHLORIDE 0.9 % IV SOLN
INTRAVENOUS | Status: DC
  Administered 2016-03-12: 08:00:00 via INTRAVENOUS

## 2016-03-12 MED ORDER — FENTANYL CITRATE (PF) 100 MCG/2ML IJ SOLN
INTRAMUSCULAR | Status: DC | PRN
  Administered 2016-03-12 (×2): 25 ug via INTRAVENOUS

## 2016-03-12 MED ORDER — PROPOFOL 500 MG/50ML IV EMUL
INTRAVENOUS | Status: DC | PRN
  Administered 2016-03-12: 50 ug/kg/min via INTRAVENOUS

## 2016-03-12 NOTE — Anesthesia Preprocedure Evaluation (Signed)
Anesthesia Evaluation  Patient identified by MRN, date of birth, ID band Patient awake    Reviewed: Allergy & Precautions, H&P , NPO status , Patient's Chart, lab work & pertinent test results  History of Anesthesia Complications Negative for: history of anesthetic complications  Airway Mallampati: III  TM Distance: <3 FB Neck ROM: limited    Dental  (+) Poor Dentition, Chipped, Missing, Partial Upper   Pulmonary shortness of breath and with exertion, COPD, former smoker,    Pulmonary exam normal breath sounds clear to auscultation       Cardiovascular Exercise Tolerance: Good hypertension, (-) angina+ CAD and + Peripheral Vascular Disease  (-) Past MI Normal cardiovascular exam Rhythm:regular Rate:Normal     Neuro/Psych negative neurological ROS  negative psych ROS   GI/Hepatic Neg liver ROS, GERD  Controlled and Medicated,  Endo/Other  negative endocrine ROS  Renal/GU negative Renal ROS  negative genitourinary   Musculoskeletal  (+) Arthritis ,   Abdominal   Peds  Hematology negative hematology ROS (+)   Anesthesia Other Findings Past Medical History: No date: Arthritis No date: Cancer (HCC) No date: GERD (gastroesophageal reflux disease) No date: Hyperlipidemia No date: Hypertension No date: Seasonal allergies  Past Surgical History: No date: ABDOMINAL HYSTERECTOMY No date: BLADDER SUSPENSION No date: CARDIAC CATHETERIZATION     Comment: [place], [place] No date: CATARACT EXTRACTION 2016: CORONARY ANGIOPLASTY No date: CYSTOCELE REPAIR No date: EYE SURGERY     Comment: cataract surgery No date: rectal vaginal fistula repair No date: TONSILLECTOMY No date: VAGINAL HYSTERECTOMY  BMI    Body Mass Index:  28.35 kg/m      Reproductive/Obstetrics negative OB ROS                             Anesthesia Physical Anesthesia Plan  ASA: III  Anesthesia Plan: General    Post-op Pain Management:    Induction:   Airway Management Planned:   Additional Equipment:   Intra-op Plan:   Post-operative Plan:   Informed Consent: I have reviewed the patients History and Physical, chart, labs and discussed the procedure including the risks, benefits and alternatives for the proposed anesthesia with the patient or authorized representative who has indicated his/her understanding and acceptance.   Dental Advisory Given  Plan Discussed with: Anesthesiologist, CRNA and Surgeon  Anesthesia Plan Comments:         Anesthesia Quick Evaluation

## 2016-03-12 NOTE — Transfer of Care (Signed)
Immediate Anesthesia Transfer of Care Note  Patient: Deborah Gibson  Procedure(s) Performed: Procedure(s): ESOPHAGOGASTRODUODENOSCOPY (EGD) WITH PROPOFOL (N/A)  Patient Location: PACU  Anesthesia Type:General  Level of Consciousness: sedated  Airway & Oxygen Therapy: Patient Spontanous Breathing and Patient connected to nasal cannula oxygen  Post-op Assessment: Report given to RN and Post -op Vital signs reviewed and stable  Post vital signs: Reviewed and stable  Last Vitals:  Vitals:   03/12/16 0805  BP: (!) 173/57  Pulse: 67  Resp: 18  Temp: (!) 36.1 C    Last Pain:  Vitals:   03/12/16 0805  TempSrc: Tympanic         Complications: No apparent anesthesia complications

## 2016-03-12 NOTE — Anesthesia Postprocedure Evaluation (Signed)
Anesthesia Post Note  Patient: RAUSHANAH RAK  Procedure(s) Performed: Procedure(s) (LRB): ESOPHAGOGASTRODUODENOSCOPY (EGD) WITH PROPOFOL (N/A)  Patient location during evaluation: Endoscopy Anesthesia Type: General Level of consciousness: awake and alert Pain management: pain level controlled Vital Signs Assessment: post-procedure vital signs reviewed and stable Respiratory status: spontaneous breathing, nonlabored ventilation, respiratory function stable and patient connected to nasal cannula oxygen Cardiovascular status: blood pressure returned to baseline and stable Postop Assessment: no signs of nausea or vomiting Anesthetic complications: no    Last Vitals:  Vitals:   03/12/16 0929 03/12/16 0939  BP: (!) 156/60 (!) 143/66  Pulse: 73 80  Resp: 19 17  Temp:      Last Pain:  Vitals:   03/12/16 0909  TempSrc: Tympanic                 Precious Haws Oluwatimilehin Balfour

## 2016-03-12 NOTE — Op Note (Signed)
[facility] Patient Name: Deborah Gibson Procedure Date: 03/12/2016 8:54 AM MRN: [id number] Account #: [id number] Date of Birth: 1931-12-17 Admit Type: Ambulatory Age: 82 Room: [facility] ENDO ROOM 3 Gender: Female Note Status: Finalized Procedure:            Upper GI endoscopy Indications:          Abnormal CT of the GI tract Providers:            Jonathon Bellows MD, MD Referring MD:         Guadalupe Maple, MD (Referring MD) Medicines:            Monitored Anesthesia Care Complications:        No immediate complications. Procedure:            Pre-Anesthesia Assessment:                       - Prior to the procedure, a History and Physical was                        performed, and patient medications, allergies and                        sensitivities were reviewed. The patient's tolerance of                        previous anesthesia was reviewed.                       - The risks and benefits of the procedure and the                        sedation options and risks were discussed with the                        patient. All questions were answered and informed                        consent was obtained.                       - The risks and benefits of the procedure and the                        sedation options and risks were discussed with the                        patient. All questions were answered and informed                        consent was obtained.                       - ASA Grade Assessment: III - A patient with severe                        systemic disease.                       After obtaining informed consent, the endoscope was  passed under direct vision. Throughout the procedure,                        the patient's blood pressure, pulse, and oxygen                        saturations were monitored continuously. The Endoscope                        was introduced through the mouth, and advanced to the                     third part of duodenum. The upper GI endoscopy was                        accomplished with ease. The patient tolerated the                        procedure well. Findings:      The examined duodenum was normal.      The entire examined stomach was normal. Biopsies were taken with a cold       forceps for histology.      The Z-line was irregular and was found 38 cm from the incisors. Biopsies       were taken with a cold forceps for histology. Impression:           - Normal examined duodenum.                       - Normal stomach. Biopsied.                       - Z-line irregular, 38 cm from the incisors. Biopsied. Recommendation:       - Discharge patient to home (with escort).                       - Resume previous diet.                       - Continue present medications.                       - Await pathology results.                       - Return to my office as previously scheduled. Procedure Code(s):    --- Professional ---                       [phone], Esophagogastroduodenoscopy, flexible, transoral;                        with biopsy, single or multiple Diagnosis Code(s):    --- Professional ---                       K22.8, Other specified diseases of esophagus                       R93.3, Abnormal findings on diagnostic imaging of other                        parts of digestive tract  CPT copyright 2016 American Medical Association. All rights reserved. The codes documented in this report are preliminary and upon coder review may  be revised to meet current compliance requirements. Jonathon Bellows, MD Jonathon Bellows MD, MD 03/12/2016 9:07:47 AM This report has been signed electronically. Number of Addenda: 0 Note Initiated On: 03/12/2016 8:54 AM Total Procedure Duration: 0 hours 5 minutes 50 seconds       [facility]

## 2016-03-12 NOTE — H&P (Signed)
Jonathon Bellows MD [address] [place], [place] [address] Phone: [phone] Fax : [phone]  Primary Care Physician:  Golden Pop, MD Primary Gastroenterologist:  Dr. Jonathon Bellows   Pre-Procedure History & Physical: HPI:  Deborah Gibson is a 82 y.o. female is here for an endoscopy.   Past Medical History:  Diagnosis Date  . Arthritis   . Cancer ([place])   . GERD (gastroesophageal reflux disease)   . Hyperlipidemia   . Hypertension   . Seasonal allergies     Past Surgical History:  Procedure Laterality Date  . ABDOMINAL HYSTERECTOMY    . BLADDER SUSPENSION    . [place], [place]  . CATARACT EXTRACTION    . CORONARY ANGIOPLASTY  2016  . CYSTOCELE REPAIR    . EYE SURGERY     cataract surgery  . rectal vaginal fistula repair    . TONSILLECTOMY    . VAGINAL HYSTERECTOMY      Prior to Admission medications   Medication Sig Start Date End Date Taking? Authorizing Provider  aspirin EC 81 MG tablet Take 81 mg by mouth daily.    Historical Provider, MD  carvedilol (COREG) 25 MG tablet Take 25 mg by mouth 2 (two) times daily with a meal.  03/29/14   Historical Provider, MD  cetirizine (ZYRTEC) 10 MG tablet Take 10 mg by mouth daily.    Historical Provider, MD  Coenzyme Q10 (COQ10) 200 MG CAPS Take by mouth.    Historical Provider, MD  fluticasone (FLONASE) 50 MCG/ACT nasal spray Place 2 sprays into both nostrils daily as needed.     Historical Provider, MD  hydrochlorothiazide (HYDRODIURIL) 25 MG tablet Take 1 tablet (25 mg total) by mouth daily. 12/03/15   Guadalupe Maple, MD  pantoprazole (PROTONIX) 40 MG tablet Take 40 mg by mouth daily.    Historical Provider, MD  vitamin B-12 (CYANOCOBALAMIN) 500 MCG tablet Take 500 mcg by mouth daily.    Historical Provider, MD    Allergies as of 03/03/2016 - Review Complete 03/03/2016  Allergen Reaction Noted  . Scopolamine Anaphylaxis 05/01/2014  . Amlodipine  05/01/2014  . Amlodipine  Swelling 11/22/2014  . Benazepril Cough 11/22/2014  . Codeine  05/01/2014  . Crestor [rosuvastatin] Other (See Comments) 11/22/2014  . Lovastatin Other (See Comments) 11/22/2014  . Scopolamine  11/22/2014  . Statins  05/01/2014  . Zocor [simvastatin] Other (See Comments) 11/22/2014    Family History  Problem Relation Age of Onset  . Hypertension Mother   . Heart failure Mother   . Hyperlipidemia Mother   . Stroke Mother   . Cancer Sister   . Heart disease Sister   . Hypertension Sister   . Heart disease Father   . Diabetes Brother   . Heart disease Brother     pacer/defiib  . Hypertension Brother   . Heart attack Brother   . Heart disease Brother     CABG  . Leukemia Daughter     Social History   Social History  . Marital status: Widowed    Spouse name: N/A  . Number of children: N/A  . Years of education: N/A   Occupational History  . Not on file.   Social History Main Topics  . Smoking status: Former Smoker    Packs/day: 3.00    Years: 23.00    Types: Cigarettes    Quit date: 04/15/1978  . Smokeless tobacco: Never Used  . Alcohol use No  .  Drug use: No  . Sexual activity: Not on file   Other Topics Concern  . Not on file   Social History Narrative   ** Merged History Encounter **        Review of Systems: See HPI, otherwise negative ROS  Physical Exam: There were no vitals taken for this visit. General:   Alert,  pleasant and cooperative in NAD Head:  Normocephalic and atraumatic. Neck:  Supple; no masses or thyromegaly. Lungs:  Clear throughout to auscultation.    Heart:  Regular rate and rhythm. Abdomen:  Soft, nontender and nondistended. Normal bowel sounds, without guarding, and without rebound.   Neurologic:  Alert and  oriented x4;  grossly normal neurologically.  Impression/Plan: CAYLYNN BROOKING is here for an endoscopy to be performed for an abnormal CT scan showing thickening of the esophagus as well as for abdominal pain   Risks,  benefits, limitations, and alternatives regarding  endoscopy have been reviewed with the patient.  Questions have been answered.  All parties agreeable.   Jonathon Bellows, MD  03/12/2016, 8:05 AM

## 2016-03-13 ENCOUNTER — Encounter: Payer: Self-pay | Admitting: Gastroenterology

## 2016-03-14 LAB — URINALYSIS, ROUTINE W REFLEX MICROSCOPIC
BILIRUBIN UA: NEGATIVE
GLUCOSE, UA: NEGATIVE
Nitrite, UA: NEGATIVE
RBC UA: NEGATIVE
SPEC GRAV UA: 1.02 (ref 1.005–1.030)
UUROB: 1 mg/dL (ref 0.2–1.0)
pH, UA: 5.5 (ref 5.0–7.5)

## 2016-03-14 LAB — URINE CULTURE

## 2016-03-14 LAB — MICROSCOPIC EXAMINATION

## 2016-03-15 LAB — SURGICAL PATHOLOGY

## 2016-03-19 ENCOUNTER — Ambulatory Visit: Payer: Commercial Managed Care - HMO

## 2016-03-31 ENCOUNTER — Ambulatory Visit: Payer: Self-pay | Admitting: Gastroenterology

## 2016-03-31 ENCOUNTER — Ambulatory Visit (INDEPENDENT_AMBULATORY_CARE_PROVIDER_SITE_OTHER): Payer: Commercial Managed Care - HMO | Admitting: Gastroenterology

## 2016-03-31 ENCOUNTER — Encounter: Payer: Self-pay | Admitting: Gastroenterology

## 2016-03-31 VITALS — BP 124/79 | HR 105 | Temp 97.8°F | Resp 18 | Ht 65.0 in | Wt 179.0 lb

## 2016-03-31 DIAGNOSIS — K2100 Gastro-esophageal reflux disease with esophagitis, without bleeding: Secondary | ICD-10-CM

## 2016-03-31 NOTE — Patient Instructions (Signed)
Please follow-up in 6 months as scheduled below.

## 2016-03-31 NOTE — Progress Notes (Signed)
Primary Care Physician: Golden Pop, MD  Primary Gastroenterologist:  Dr. Jonathon Bellows   Chief Complaint  Patient presents with  . Referral    HPI: Deborah Gibson is a 83 y.o. female .  She is here for follow up for abdominal pain.   Summary of history :  The patient had a CT scan ofher abdomen and it showed some thickening of the esophagus possibly related to reflux, stone at the neck of the gall bladder and felt an endoscopy was rightly warranted. There has been no abnormalities of the liver function tests, no features suggestive of acute cholecystitis. At her initial visit claimed relief of her chest symptoms with PPI use. She also had a different type of pain in the upper abdomen for 1 year which was worse when she walked.   Interval history 02/2016 -03/2016  Gained 3 lbs since last visit.  EGD 02/2016 showed irregular Z line bx showed features of reflux esophagitis. Gastric antral biopsy showed chronic inactive gastritis with no H pylori.   Feels pretty good. She says she has missed a few doses of her PPI. She is on Protonix 40 mg a day, in the morning , she takes it on a full stomach. Advised her to take it on an empty stomach .    Current Outpatient Prescriptions  Medication Sig Dispense Refill  . aspirin EC 81 MG tablet Take 81 mg by mouth daily.    . carvedilol (COREG) 25 MG tablet Take 25 mg by mouth 2 (two) times daily with a meal.     . cetirizine (ZYRTEC) 10 MG tablet Take 10 mg by mouth daily.    . Coenzyme Q10 (COQ10) 200 MG CAPS Take by mouth.    . fluticasone (FLONASE) 50 MCG/ACT nasal spray Place 2 sprays into both nostrils daily as needed.     . hydrochlorothiazide (HYDRODIURIL) 25 MG tablet Take 1 tablet (25 mg total) by mouth daily. 90 tablet 4  . pantoprazole (PROTONIX) 40 MG tablet Take 40 mg by mouth daily.    . vitamin B-12 (CYANOCOBALAMIN) 500 MCG tablet Take 500 mcg by mouth daily.     No current facility-administered medications for this visit.      Allergies as of 03/31/2016 - Review Complete 03/31/2016  Allergen Reaction Noted  . Scopolamine Anaphylaxis 05/01/2014  . Amlodipine  05/01/2014  . Amlodipine Swelling 11/22/2014  . Benazepril Cough 11/22/2014  . Codeine  05/01/2014  . Crestor [rosuvastatin] Other (See Comments) 11/22/2014  . Lovastatin Other (See Comments) 11/22/2014  . Scopolamine  11/22/2014  . Statins  05/01/2014  . Zocor [simvastatin] Other (See Comments) 11/22/2014    ROS:  General: Negative for anorexia, weight loss, fever, chills, fatigue, weakness. ENT: Negative for hoarseness, difficulty swallowing , nasal congestion. CV: Negative for chest pain, angina, palpitations, dyspnea on exertion, peripheral edema.  Respiratory: Negative for dyspn ea at rest, dyspnea on exertion, cough, sputum, wheezing.  GI: See history of present illness. GU:  Negative for dysuria, hematuria, urinary incontinence, urinary frequency, nocturnal urination.  Endo: Negative for unusual weight change.    Physical Examination:   BP 124/79   Pulse (!) 105   Temp 97.8 F (36.6 C)   Resp 18   Ht 5' (1.524 m)   Wt 179 lb (81.2 kg)   SpO2 98%   BMI 34.96 kg/m   General: Well-nourished, well-developed in no acute distress.  Eyes: No icterus. Conjunctivae pink. Mouth: Oropharyngeal mucosa moist and pink , no lesions erythema  or exudate. Lungs: Clear to auscultation bilaterally. Non-labored. Heart: Regular rate and rhythm, no murmurs rubs or gallops.  Abdomen: Bowel sounds are normal, nontender, nondistended, no hepatosplenomegaly or masses, no abdominal bruits or hernia , no rebound or guarding.   Extremities: No lower extremity edema. No clubbing or deformities. Neuro: Alert and oriented x 3.  Grossly intact. Skin: Warm and dry, no jaundice.   Psych: Alert and cooperative, normal mood and affect.  Imaging Studies: No results found.  Assessment and Plan:   Deborah Gibson is a 83 y.o. y/o female here for follow up for  an abnormal CT scan and chest discomfort. Her symptoms are consistent with reflux. Counseled on life style changes and proper timing of PPI use. Will see her back in 6 months with aim to decrease dose of PPI if tolerated.    Dr Jonathon Bellows  MD F/u in 6 months

## 2016-05-19 ENCOUNTER — Other Ambulatory Visit: Payer: Self-pay | Admitting: Family Medicine

## 2016-05-19 MED ORDER — CARVEDILOL 25 MG PO TABS: 25.0000 mg | ORAL_TABLET | Freq: Two times a day (BID) | ORAL | 3 refills | Status: DC

## 2016-05-19 NOTE — Telephone Encounter (Signed)
Last OV: 03/05/16 Next OV: 06/17/16  BMP Latest Ref Rng & Units 03/10/2016 03/05/2016 02/24/2016  Glucose 65 - 99 mg/dL 92 104(H) 122(H)  BUN 8 - 27 mg/dL 22 19 23(H)  Creatinine 0.57 - 1.00 mg/dL 0.94 0.95 0.98  BUN/Creat Ratio 12 - 28 23 20  -  Sodium 134 - 144 mmol/L 139 135 134(L)  Potassium 3.5 - 5.2 mmol/L 4.8 5.6(H) 3.3(L)  Chloride 96 - 106 mmol/L 98 96 95(L)  CO2 18 - 29 mmol/L 25 22 28   Calcium 8.7 - 10.3 mg/dL 9.1 9.4 8.6(L)

## 2016-05-19 NOTE — Telephone Encounter (Signed)
Patient needs refill on carvedilol 25mg  90 day supply sent to [facility] in [place].  Thanks

## 2016-06-02 ENCOUNTER — Ambulatory Visit: Payer: Self-pay | Admitting: Family Medicine

## 2016-06-17 ENCOUNTER — Encounter: Payer: Self-pay | Admitting: Family Medicine

## 2016-06-17 ENCOUNTER — Ambulatory Visit (INDEPENDENT_AMBULATORY_CARE_PROVIDER_SITE_OTHER): Payer: Medicare HMO | Admitting: Family Medicine

## 2016-06-17 VITALS — BP 154/73 | HR 59 | Wt 177.6 lb

## 2016-06-17 DIAGNOSIS — R040 Epistaxis: Secondary | ICD-10-CM

## 2016-06-17 DIAGNOSIS — Z7189 Other specified counseling: Secondary | ICD-10-CM | POA: Diagnosis not present

## 2016-06-17 DIAGNOSIS — I10 Essential (primary) hypertension: Secondary | ICD-10-CM | POA: Diagnosis not present

## 2016-06-17 DIAGNOSIS — E785 Hyperlipidemia, unspecified: Secondary | ICD-10-CM

## 2016-06-17 NOTE — Assessment & Plan Note (Signed)
Poor control off medication patient will take medication prior to office visits to assess medication more thoroughly. Will also drink lots of water because of difficulty with venipuncture.

## 2016-06-17 NOTE — Progress Notes (Signed)
BP (!) 154/73   Pulse (!) 59   Wt 177 lb 9.6 oz (80.6 kg)   SpO2 99%   BMI 29.55 kg/m    Subjective:    Patient ID: Deborah Gibson, female    DOB: 12/25/1931, 83 y.o.   MRN: [id number]  HPI: Deborah Gibson is a 83 y.o. female  Chief Complaint  Patient presents with  . Follow-up  . Hypertension  Patient follow-up hypertension has been fasting today and little dehydrated hasn't taking blood pressure medicines today and is waiting to eat for taking medicines. Blood pressure elevated off medications. Habits 2 sticks and was unsuccessful at getting a BMP. Patient also using Flonase nose spray and is noticed some burning and occasional bleeding especially on the right side.  Relevant past medical, surgical, family and social history reviewed and updated as indicated. Interim medical history since our last visit reviewed. Allergies and medications reviewed and updated.  Review of Systems  Constitutional: Negative.   HENT: Negative.   Eyes: Negative.   Respiratory: Negative.   Cardiovascular: Negative.     Per HPI unless specifically indicated above     Objective:    BP (!) 154/73   Pulse (!) 59   Wt 177 lb 9.6 oz (80.6 kg)   SpO2 99%   BMI 29.55 kg/m   Wt Readings from Last 3 Encounters:  06/17/16 177 lb 9.6 oz (80.6 kg)  03/31/16 179 lb (81.2 kg)  03/12/16 173 lb (78.5 kg)    Physical Exam  Constitutional: She is oriented to person, place, and time. She appears well-developed and well-nourished.  HENT:  Head: Normocephalic and atraumatic.  Right Ear: External ear normal.  Left Ear: External ear normal.  Nose: Nose normal.  Mouth/Throat: Oropharynx is clear and moist.  Eyes: Conjunctivae and EOM are normal.  Neck: Normal range of motion.  Cardiovascular: Normal rate, regular rhythm and normal heart sounds.   Pulmonary/Chest: Effort normal and breath sounds normal.  Musculoskeletal: Normal range of motion.  Neurological: She is alert and oriented to person,  place, and time.  Skin: No erythema.  Psychiatric: She has a normal mood and affect. Her behavior is normal. Judgment and thought content normal.    Results for orders placed or performed during the hospital encounter of 03/12/16  Surgical pathology  Result Value Ref Range   SURGICAL PATHOLOGY      Surgical Pathology CASE: [id number] PATIENT: [facility] Surgical Pathology Report     SPECIMEN SUBMITTED: A. GEJ; cbx B. Stomach, antrum; cbx  CLINICAL HISTORY: None provided  PRE-OPERATIVE DIAGNOSIS: GERD K21.9  POST-OPERATIVE DIAGNOSIS: R/O esophagitis     DIAGNOSIS: A. GASTROESOPHAGEAL JUNCTION; COLD BIOPSY: - SQUAMOCOLUMNAR MUCOSA WITH MODERATE CHRONIC ACTIVE INFLAMMATION CONSISTENT WITH REFLUX ESOPHAGITIS. - NEGATIVE FOR GOBLET CELLS, DYSPLASIA, AND MALIGNANCY.  B. STOMACH, ANTRUM; COLD BIOPSY: - MILD CHRONIC INACTIVE GASTRITIS AND PROTON PUMP INHIBITOR EFFECT. - NEGATIVE FOR INTESTINAL METAPLASIA, DYSPLASIA, AND MALIGNANCY. - NEGATIVE FOR HELICOBACTER PYLORI IN HEMATOXYLIN AND EOSIN SECTIONS.   GROSS DESCRIPTION: A. Labeled:  GEJ biopsy Tissue Fragments: 2 Measurement: 0.2-0.4 cm Comment: Tan tissue fragments  Entirely submitted in cassette(s):  1   B. Labeled:  Antrum biopsy Tissue Fragments: 2 Measurement: 0.3-0.5 cm Commen t: Tan tissue fragments  Entirely submitted in cassette(s):  1  Final Diagnosis performed by Bryan Lemma, MD.  Electronically signed 03/15/2016 11:36:57AM    The electronic signature indicates that the named Attending Pathologist has evaluated the specimen  Technical component performed at [facility], [place]  [address] [place], [place] [address] Lab: [phone] Dir: Darrick Penna. Evette Doffing, MD  Professional component performed at [facility], [facility], [place], [place], [place] [address] Lab: [phone] Dir: Dellia Nims. Reuel Derby, MD        Assessment & Plan:   Problem List Items Addressed This  Visit      Cardiovascular and Mediastinum   Essential hypertension - Primary    Poor control off medication patient will take medication prior to office visits to assess medication more thoroughly. Will also drink lots of water because of difficulty with venipuncture.      Relevant Orders   Basic metabolic panel     Other   Hyperlipidemia   Relevant Orders   Basic metabolic panel    Other Visit Diagnoses    Advance care planning       Frequent nosebleeds       Discuss nosebleeds with use of Flonase will discontinue Flonase for now and observe symptoms.       Follow up plan: Return in about 4 weeks (around 07/15/2016) for BMP BP check.

## 2016-07-20 ENCOUNTER — Other Ambulatory Visit: Payer: Self-pay

## 2016-07-20 MED ORDER — CARVEDILOL 25 MG PO TABS: 25.0000 mg | ORAL_TABLET | Freq: Two times a day (BID) | ORAL | 1 refills | Status: DC

## 2016-07-20 NOTE — Telephone Encounter (Signed)
Pt requesting 90 day supply

## 2016-07-22 ENCOUNTER — Ambulatory Visit (INDEPENDENT_AMBULATORY_CARE_PROVIDER_SITE_OTHER): Payer: Medicare HMO | Admitting: Family Medicine

## 2016-07-22 ENCOUNTER — Encounter: Payer: Self-pay | Admitting: Family Medicine

## 2016-07-22 DIAGNOSIS — E785 Hyperlipidemia, unspecified: Secondary | ICD-10-CM

## 2016-07-22 DIAGNOSIS — I10 Essential (primary) hypertension: Secondary | ICD-10-CM

## 2016-07-22 NOTE — Assessment & Plan Note (Signed)
The current medical regimen is effective;  continue present plan and medications.  

## 2016-07-22 NOTE — Progress Notes (Signed)
   BP 131/80   Pulse 67   Wt 179 lb 9.6 oz (81.5 kg)   SpO2 98%   BMI 29.89 kg/m    Subjective:    Patient ID: Deborah Gibson, female    DOB: 1931/08/31, 83 y.o.   MRN: [id number]  HPI: Deborah Gibson is a 83 y.o. female  Chief Complaint  Patient presents with  . Follow-up  . Hypertension  Patient doing well taking blood pressure medications today and drink extra water was able to get venipuncture accomplished without problems and blood pressure good control. No issues with medications.  Nose bleeds have stopped discontinue Flonase for about a week now just uses on a when necessary basis but not every day and is doing well.  Relevant past medical, surgical, family and social history reviewed and updated as indicated. Interim medical history since our last visit reviewed. Allergies and medications reviewed and updated.  Review of Systems  Constitutional: Negative.   Respiratory: Negative.   Cardiovascular: Negative.     Per HPI unless specifically indicated above     Objective:    BP 131/80   Pulse 67   Wt 179 lb 9.6 oz (81.5 kg)   SpO2 98%   BMI 29.89 kg/m   Wt Readings from Last 3 Encounters:  07/22/16 179 lb 9.6 oz (81.5 kg)  06/17/16 177 lb 9.6 oz (80.6 kg)  03/31/16 179 lb (81.2 kg)    Physical Exam  Constitutional: She is oriented to person, place, and time. She appears well-developed and well-nourished.  HENT:  Head: Normocephalic and atraumatic.  Eyes: Conjunctivae and EOM are normal.  Neck: Normal range of motion.  Cardiovascular: Normal rate, regular rhythm and normal heart sounds.   Pulmonary/Chest: Effort normal and breath sounds normal.  Musculoskeletal: Normal range of motion.  Neurological: She is alert and oriented to person, place, and time.  Skin: No erythema.  Psychiatric: She has a normal mood and affect. Her behavior is normal. Judgment and thought content normal.        Assessment & Plan:   Problem List Items Addressed This  Visit      Cardiovascular and Mediastinum   Essential hypertension    The current medical regimen is effective;  continue present plan and medications.       Relevant Orders   Basic metabolic panel     Other   Hyperlipidemia   Relevant Orders   Basic metabolic panel     Nosebleeds resolved with less Flonase use  Follow up plan: Return for Physical Exam Sept.

## 2016-07-23 ENCOUNTER — Encounter: Payer: Self-pay | Admitting: Family Medicine

## 2016-07-23 LAB — BASIC METABOLIC PANEL
BUN/Creatinine Ratio: 24 (ref 12–28)
BUN: 17 mg/dL (ref 8–27)
CALCIUM: 9.4 mg/dL (ref 8.7–10.3)
CO2: 25 mmol/L (ref 18–29)
CREATININE: 0.72 mg/dL (ref 0.57–1.00)
Chloride: 99 mmol/L (ref 96–106)
GFR, EST AFRICAN AMERICAN: 90 mL/min/{1.73_m2} (ref >59)
GFR, EST NON AFRICAN AMERICAN: 78 mL/min/{1.73_m2} (ref >59)
Glucose: 99 mg/dL (ref 65–99)
Potassium: 4.3 mmol/L (ref 3.5–5.2)
Sodium: 142 mmol/L (ref 134–144)

## 2016-07-27 DIAGNOSIS — H60542 Acute eczematoid otitis externa, left ear: Secondary | ICD-10-CM | POA: Diagnosis not present

## 2016-07-27 DIAGNOSIS — R490 Dysphonia: Secondary | ICD-10-CM | POA: Diagnosis not present

## 2016-07-27 DIAGNOSIS — K219 Gastro-esophageal reflux disease without esophagitis: Secondary | ICD-10-CM | POA: Diagnosis not present

## 2016-08-01 NOTE — Progress Notes (Signed)
Cardiology Office Note  Date:  08/03/2016   ID:  Deborah, Gibson [birth year], MRN [id number]  PCP:  Deborah Maple, MD   Chief Complaint  Patient presents with  . other    1 yr f/u no complaints today. Meds reviewed verbally with pt.    HPI:  [name] is a very pleasant 83 year old woman with history of  hypertension,  smoking for 25 years,  esophageal problems  cardiac catheterization 10 years ago in [place],  chest pain symptoms,  cardiac catheterization 05/07/2014.  nonobstructive disease, EF>55% She is a patient of Deborah Gibson. Jeananne Gibson. She presents today for chest pain symptoms  She reports that she is active, goes bowling frequently, plays with a league  tightness in the paravertebral muscles,  tightness in her neck radiating  Better  when she walks quickly up hills,  she has tightness in her neck sometimes radiating into her front of the chest  Walking on flat surfaces she has no significant difficulty She has been seeing massage once per week for her tight neck and upper back muscles  Previous labs reviewed personally by myself and with the patient on todays visit Total chol 222 LDL 131  Reports that she stopped losartan as this was causing joint issues and felt like she was overmedicated  EKG personally reviewed by myself on todays visit Shows NSR rate 67 bpm, No significant ST or T-wave changes  Other past medical history reviewed Catheterization showed mild luminal irregularities with no significant stenoses, normal ejection fraction  She reports that her mother had heart issues, sister had a stent, brother had heart attack in defibrillator  Prior cardiac catheterization at [facility], [place], [place],     [place]:   has a past medical history of Arthritis; Cancer Deborah Gibson); GERD (gastroesophageal reflux disease); Hyperlipidemia; and Seasonal allergies.  PSH:    Past Surgical History:  Procedure Laterality Date  .  ABDOMINAL HYSTERECTOMY    . BLADDER SUSPENSION    . [place], [place]  . CATARACT EXTRACTION    . CORONARY ANGIOPLASTY  2016  . CYSTOCELE REPAIR    . ESOPHAGOGASTRODUODENOSCOPY (EGD) WITH PROPOFOL N/A 03/12/2016   Procedure: ESOPHAGOGASTRODUODENOSCOPY (EGD) WITH PROPOFOL;  Surgeon: Deborah Bellows, MD;  Location: [facility] ENDOSCOPY;  Service: Endoscopy;  Laterality: N/A;  . EYE SURGERY     cataract surgery  . rectal vaginal fistula repair    . TONSILLECTOMY    . VAGINAL HYSTERECTOMY      Current Outpatient Prescriptions  Medication Sig Dispense Refill  . aspirin EC 81 MG tablet Take 81 mg by mouth daily.    . carvedilol (COREG) 25 MG tablet Take 1 tablet (25 mg total) by mouth 2 (two) times daily with a meal. 180 tablet 1  . cetirizine (ZYRTEC) 10 MG tablet Take 10 mg by mouth daily.    . Coenzyme Q10 (COQ10) 200 MG CAPS Take by mouth.    . fluticasone (FLONASE) 50 MCG/ACT nasal spray Place 2 sprays into both nostrils daily as needed.     . hydrochlorothiazide (HYDRODIURIL) 25 MG tablet Take 1 tablet (25 mg total) by mouth daily. 90 tablet 4  . pantoprazole (PROTONIX) 40 MG tablet Take 40 mg by mouth daily.    . vitamin B-12 (CYANOCOBALAMIN) 500 MCG tablet Take 500 mcg by mouth daily.     No current facility-administered medications for this visit.      Allergies:   Scopolamine; Amlodipine; Amlodipine; Benazepril; Codeine; Crestor [rosuvastatin];  Lovastatin; Scopolamine; Statins; and Zocor [simvastatin]   Social History:  The patient  reports that she quit smoking about 38 years ago. Her smoking use included Cigarettes. She has a 69.00 pack-year smoking history. She has never used smokeless tobacco. She reports that she does not drink alcohol or use drugs.   Family History:   family history includes Cancer in her sister; Diabetes in her brother; Heart attack in her brother; Heart disease in her brother, brother, father, and sister; Heart failure in her  mother; Hyperlipidemia in her mother; Hypertension in her brother, mother, and sister; Leukemia in her daughter; Stroke in her mother.    Review of Systems: Review of Systems  Constitutional: Negative.   Respiratory: Negative.   Cardiovascular: Negative.   Gastrointestinal: Negative.   Musculoskeletal: Negative.   Neurological: Negative.   Psychiatric/Behavioral: Negative.   All other systems reviewed and are negative.    PHYSICAL EXAM: VS:  BP 110/60 (BP Location: Left Arm, Patient Position: Sitting, Cuff Size: Normal)   Pulse 67   Ht 5' 5.5" (1.664 m)   Wt 180 lb (81.6 kg)   BMI 29.50 kg/m  , BMI Body mass index is 29.5 kg/m. GEN: Well nourished, well developed, in no acute distress  HEENT: normal  Neck: no JVD, carotid bruits, or masses Cardiac: RRR; no murmurs, rubs, or gallops,no edema  Respiratory:  clear to auscultation bilaterally, normal work of breathing GI: soft, nontender, nondistended, + BS MS: no deformity or atrophy  Skin: warm and dry, no rash Neuro:  Strength and sensation are intact Psych: euthymic mood, full affect    Recent Labs: 12/03/2015: TSH 1.750 02/24/2016: ALT 41; Hemoglobin 12.6; Platelets 273 07/22/2016: BUN 17; Creatinine, Ser 0.72; Potassium 4.3; Sodium 142    Lipid Panel Lab Results  Component Value Date   CHOL 222 (H) 12/03/2015   HDL 43 12/03/2015   LDLCALC 131 (H) 12/03/2015   TRIG 239 (H) 12/03/2015      Wt Readings from Last 3 Encounters:  08/03/16 180 lb (81.6 kg)  07/22/16 179 lb 9.6 oz (81.5 kg)  06/17/16 177 lb 9.6 oz (80.6 kg)       ASSESSMENT AND PLAN:  Hyperlipidemia, unspecified hyperlipidemia type - Plan: EKG 12-Lead She does not want a cholesterol medication Cholesterol numbers improved in the past year  Essential hypertension - Plan: EKG 12-Lead Blood pressure is well controlled on today's visit. No changes made to the medications.  Coronary artery disease involving native coronary artery of native  heart without angina pectoris - Plan: EKG 12-Lead Previous cardiac catheterization in 2016 with minimal luminal irregularities No further testing needed  PVD (peripheral vascular disease) ([place]) - Plan: EKG 12-Lead  Family history of early CAD - Plan: EKG 12-Lead  Smoking history - Plan: EKG 12-Lead Stopped smoking many years ago   Total encounter time more than 15 minutes  Greater than 50% was spent in counseling and coordination of care with the patient   Disposition:   F/U  12 months   Orders Placed This Encounter  Procedures  . EKG 12-Lead     Signed, Esmond Plants, M.D., Ph.D. 08/03/2016  [facility] [place], [place] [phone]

## 2016-08-03 ENCOUNTER — Ambulatory Visit (INDEPENDENT_AMBULATORY_CARE_PROVIDER_SITE_OTHER): Payer: Medicare HMO | Admitting: Cardiovascular Disease

## 2016-08-03 ENCOUNTER — Encounter: Payer: Self-pay | Admitting: Cardiovascular Disease

## 2016-08-03 VITALS — BP 110/60 | HR 67 | Ht 65.5 in | Wt 180.0 lb

## 2016-08-03 DIAGNOSIS — Z87891 Personal history of nicotine dependence: Secondary | ICD-10-CM | POA: Diagnosis not present

## 2016-08-03 DIAGNOSIS — E785 Hyperlipidemia, unspecified: Secondary | ICD-10-CM

## 2016-08-03 DIAGNOSIS — I2510 Atherosclerotic heart disease of native coronary artery without angina pectoris: Secondary | ICD-10-CM

## 2016-08-03 DIAGNOSIS — I10 Essential (primary) hypertension: Secondary | ICD-10-CM | POA: Diagnosis not present

## 2016-08-03 DIAGNOSIS — I739 Peripheral vascular disease, unspecified: Secondary | ICD-10-CM | POA: Diagnosis not present

## 2016-08-03 DIAGNOSIS — Z8249 Family history of ischemic heart disease and other diseases of the circulatory system: Secondary | ICD-10-CM

## 2016-08-03 NOTE — Patient Instructions (Signed)

## 2016-09-08 DIAGNOSIS — L219 Seborrheic dermatitis, unspecified: Secondary | ICD-10-CM | POA: Diagnosis not present

## 2016-09-08 DIAGNOSIS — L578 Other skin changes due to chronic exposure to nonionizing radiation: Secondary | ICD-10-CM | POA: Diagnosis not present

## 2016-09-08 DIAGNOSIS — Z85828 Personal history of other malignant neoplasm of skin: Secondary | ICD-10-CM | POA: Diagnosis not present

## 2016-09-08 DIAGNOSIS — L820 Inflamed seborrheic keratosis: Secondary | ICD-10-CM | POA: Diagnosis not present

## 2016-09-08 DIAGNOSIS — L821 Other seborrheic keratosis: Secondary | ICD-10-CM | POA: Diagnosis not present

## 2016-10-05 ENCOUNTER — Ambulatory Visit: Payer: Medicare HMO | Admitting: Gastroenterology

## 2016-10-14 ENCOUNTER — Ambulatory Visit (INDEPENDENT_AMBULATORY_CARE_PROVIDER_SITE_OTHER): Payer: Medicare HMO

## 2016-10-14 VITALS — BP 112/64 | HR 72 | Temp 97.7°F | Resp 16 | Ht 66.0 in | Wt 178.8 lb

## 2016-10-14 DIAGNOSIS — Z0000 Encounter for general adult medical examination without abnormal findings: Secondary | ICD-10-CM | POA: Diagnosis not present

## 2016-10-14 NOTE — Patient Instructions (Signed)
Deborah Gibson , Thank you for taking time to come for your Medicare Wellness Visit. I appreciate your ongoing commitment to your health goals. Please review the following plan we discussed and let me know if I can assist you in the future.   Screening recommendations/referrals: Colonoscopy: completed 08/20/2009, no longer required Mammogram: completed 10/06/2013, no longer required Bone Density: completed 10/06/2013 Recommended yearly ophthalmology/optometry visit for glaucoma screening and checkup Recommended yearly dental visit for hygiene and checkup  Vaccinations: Influenza vaccine: up to date, due 01/2017 Pneumococcal vaccine: up to date Tdap vaccine: due, check with your insurance company for coverage Shingles vaccine: due, check with your insurance company for coverage  Advanced directives: Advance directive discussed with you today. I have provided a copy for you to complete at home and have notarized. Once this is complete please bring a copy in to our office so we can scan it into your chart.  Conditions/risks identified: Recommend drinking at least 3-4 glasses a day.   Next appointment: Follow up in one year for your annual wellness exam.    Preventive Care 65 Years and Older, Female Preventive care refers to lifestyle choices and visits with your health care provider that can promote health and wellness. What does preventive care include?  A yearly physical exam. This is also called an annual well check.  Dental exams once or twice a year.  Routine eye exams. Ask your health care provider how often you should have your eyes checked.  Personal lifestyle choices, including:  Daily care of your teeth and gums.  Regular physical activity.  Eating a healthy diet.  Avoiding tobacco and drug use.  Limiting alcohol use.  Practicing safe sex.  Taking low-dose aspirin every day.  Taking vitamin and mineral supplements as recommended by your health care provider. What  happens during an annual well check? The services and screenings done by your health care provider during your annual well check will depend on your age, overall health, lifestyle risk factors, and family history of disease. Counseling  Your health care provider may ask you questions about your:  Alcohol use.  Tobacco use.  Drug use.  Emotional well-being.  Home and relationship well-being.  Sexual activity.  Eating habits.  History of falls.  Memory and ability to understand (cognition).  Work and work Statistician.  Reproductive health. Screening  You may have the following tests or measurements:  Height, weight, and BMI.  Blood pressure.  Lipid and cholesterol levels. These may be checked every 5 years, or more frequently if you are over 25 years old.  Skin check.  Lung cancer screening. You may have this screening every year starting at age 48 if you have a 30-pack-year history of smoking and currently smoke or have quit within the past 15 years.  Fecal occult blood test (FOBT) of the stool. You may have this test every year starting at age 49.  Flexible sigmoidoscopy or colonoscopy. You may have a sigmoidoscopy every 5 years or a colonoscopy every 10 years starting at age 75.  Hepatitis C blood test.  Hepatitis B blood test.  Sexually transmitted disease (STD) testing.  Diabetes screening. This is done by checking your blood sugar (glucose) after you have not eaten for a while (fasting). You may have this done every 1-3 years.  Bone density scan. This is done to screen for osteoporosis. You may have this done starting at age 39.  Mammogram. This may be done every 1-2 years. Talk to your health care  provider about how often you should have regular mammograms. Talk with your health care provider about your test results, treatment options, and if necessary, the need for more tests. Vaccines  Your health care provider may recommend certain vaccines, such  as:  Influenza vaccine. This is recommended every year.  Tetanus, diphtheria, and acellular pertussis (Tdap, Td) vaccine. You may need a Td booster every 10 years.  Zoster vaccine. You may need this after age 45.  Pneumococcal 13-valent conjugate (PCV13) vaccine. One dose is recommended after age 64.  Pneumococcal polysaccharide (PPSV23) vaccine. One dose is recommended after age 24. Talk to your health care provider about which screenings and vaccines you need and how often you need them. This information is not intended to replace advice given to you by your health care provider. Make sure you discuss any questions you have with your health care provider. Document Released: 04/12/2015 Document Revised: 12/04/2015 Document Reviewed: 01/15/2015 Elsevier Interactive Patient Education  2017 [place] Prevention in the Home Falls can cause injuries. They can happen to people of all ages. There are many things you can do to make your home safe and to help prevent falls. What can I do on the outside of my home?  Regularly fix the edges of walkways and driveways and fix any cracks.  Remove anything that might make you trip as you walk through a door, such as a raised step or threshold.  Trim any bushes or trees on the path to your home.  Use bright outdoor lighting.  Clear any walking paths of anything that might make someone trip, such as rocks or tools.  Regularly check to see if handrails are loose or broken. Make sure that both sides of any steps have handrails.  Any raised decks and porches should have guardrails on the edges.  Have any leaves, snow, or ice cleared regularly.  Use sand or salt on walking paths during winter.  Clean up any spills in your garage right away. This includes oil or grease spills. What can I do in the bathroom?  Use night lights.  Install grab bars by the toilet and in the tub and shower. Do not use towel bars as grab bars.  Use  non-skid mats or decals in the tub or shower.  If you need to sit down in the shower, use a plastic, non-slip stool.  Keep the floor dry. Clean up any water that spills on the floor as soon as it happens.  Remove soap buildup in the tub or shower regularly.  Attach bath mats securely with double-sided non-slip rug tape.  Do not have throw rugs and other things on the floor that can make you trip. What can I do in the bedroom?  Use night lights.  Make sure that you have a light by your bed that is easy to reach.  Do not use any sheets or blankets that are too big for your bed. They should not hang down onto the floor.  Have a firm chair that has side arms. You can use this for support while you get dressed.  Do not have throw rugs and other things on the floor that can make you trip. What can I do in the kitchen?  Clean up any spills right away.  Avoid walking on wet floors.  Keep items that you use a lot in easy-to-reach places.  If you need to reach something above you, use a strong step stool that has a grab bar.  Keep electrical cords out of the way.  Do not use floor polish or wax that makes floors slippery. If you must use wax, use non-skid floor wax.  Do not have throw rugs and other things on the floor that can make you trip. What can I do with my stairs?  Do not leave any items on the stairs.  Make sure that there are handrails on both sides of the stairs and use them. Fix handrails that are broken or loose. Make sure that handrails are as long as the stairways.  Check any carpeting to make sure that it is firmly attached to the stairs. Fix any carpet that is loose or worn.  Avoid having throw rugs at the top or bottom of the stairs. If you do have throw rugs, attach them to the floor with carpet tape.  Make sure that you have a light switch at the top of the stairs and the bottom of the stairs. If you do not have them, ask someone to add them for you. What  else can I do to help prevent falls?  Wear shoes that:  Do not have high heels.  Have rubber bottoms.  Are comfortable and fit you well.  Are closed at the toe. Do not wear sandals.  If you use a stepladder:  Make sure that it is fully opened. Do not climb a closed stepladder.  Make sure that both sides of the stepladder are locked into place.  Ask someone to hold it for you, if possible.  Clearly mark and make sure that you can see:  Any grab bars or handrails.  First and last steps.  Where the edge of each step is.  Use tools that help you move around (mobility aids) if they are needed. These include:  Canes.  Walkers.  Scooters.  Crutches.  Turn on the lights when you go into a dark area. Replace any light bulbs as soon as they burn out.  Set up your furniture so you have a clear path. Avoid moving your furniture around.  If any of your floors are uneven, fix them.  If there are any pets around you, be aware of where they are.  Review your medicines with your doctor. Some medicines can make you feel dizzy. This can increase your chance of falling. Ask your doctor what other things that you can do to help prevent falls. This information is not intended to replace advice given to you by your health care provider. Make sure you discuss any questions you have with your health care provider. Document Released: 01/10/2009 Document Revised: 08/22/2015 Document Reviewed: 04/20/2014 Elsevier Interactive Patient Education  2017 [employer].

## 2016-10-14 NOTE — Progress Notes (Signed)
Subjective:   Deborah Gibson is a 83 y.o. female who presents for Medicare Annual (Subsequent) preventive examination.  Review of Systems:  Cardiac Risk Factors include: advanced age (>62men, >48 women);dyslipidemia;hypertension     Objective:     Vitals: BP 112/64 (BP Location: Left Arm, Patient Position: Sitting)   Pulse 72   Temp 97.7 F (36.5 C)   Resp 16   Ht 5\' 6"  (1.676 m)   Wt 178 lb 12.8 oz (81.1 kg)   BMI 28.86 kg/m   Body mass index is 28.86 kg/m.   Tobacco History  Smoking Status  . Former Smoker  . Packs/day: 3.00  . Years: 23.00  . Types: Cigarettes  . Quit date: 04/15/1978  Smokeless Tobacco  . Never Used     Counseling given: Not Answered   Past Medical History:  Diagnosis Date  . Arthritis   . Cancer ([place])    skin ca  . GERD (gastroesophageal reflux disease)   . Hyperlipidemia   . Seasonal allergies    Past Surgical History:  Procedure Laterality Date  . ABDOMINAL HYSTERECTOMY    . BLADDER SUSPENSION    . [place], [place]  . CATARACT EXTRACTION    . CORONARY ANGIOPLASTY  2016  . CYSTOCELE REPAIR    . ESOPHAGOGASTRODUODENOSCOPY (EGD) WITH PROPOFOL N/A 03/12/2016   Procedure: ESOPHAGOGASTRODUODENOSCOPY (EGD) WITH PROPOFOL;  Surgeon: Jonathon Bellows, MD;  Location: [facility] ENDOSCOPY;  Service: Endoscopy;  Laterality: N/A;  . EYE SURGERY     cataract surgery  . rectal vaginal fistula repair    . TONSILLECTOMY    . VAGINAL HYSTERECTOMY     Family History  Problem Relation Age of Onset  . Hypertension Mother   . Heart failure Mother   . Hyperlipidemia Mother   . Stroke Mother   . Cancer Sister   . Heart disease Sister   . Hypertension Sister   . Heart disease Father   . Diabetes Brother   . Heart disease Brother        pacer/defiib  . Hypertension Brother   . Heart attack Brother   . Heart disease Brother        CABG  . Leukemia Daughter    History  Sexual Activity  . Sexual activity: Not on  file    Outpatient Encounter Prescriptions as of 10/14/2016  Medication Sig  . aspirin EC 81 MG tablet Take 81 mg by mouth daily.  . carvedilol (COREG) 25 MG tablet Take 1 tablet (25 mg total) by mouth 2 (two) times daily with a meal.  . cetirizine (ZYRTEC) 10 MG tablet Take 10 mg by mouth daily.  . Coenzyme Q10 (COQ10) 200 MG CAPS Take by mouth.  . fluticasone (FLONASE) 50 MCG/ACT nasal spray Place 2 sprays into both nostrils daily as needed.   . hydrochlorothiazide (HYDRODIURIL) 25 MG tablet Take 1 tablet (25 mg total) by mouth daily.  . pantoprazole (PROTONIX) 40 MG tablet Take 40 mg by mouth daily.  . vitamin B-12 (CYANOCOBALAMIN) 500 MCG tablet Take 500 mcg by mouth daily.   No facility-administered encounter medications on file as of 10/14/2016.     Activities of Daily Living In your present state of health, do you have any difficulty performing the following activities: 10/14/2016 12/03/2015  Hearing? N N  Vision? N N  Difficulty concentrating or making decisions? N N  Walking or climbing stairs? Y Y  Dressing or bathing? N N  Doing errands, shopping?  N N  Preparing Food and eating ? N -  Using the Toilet? N -  In the past six months, have you accidently leaked urine? N -  Do you have problems with loss of bowel control? N -  Managing your Medications? N -  Managing your Finances? N -  Housekeeping or managing your Housekeeping? N -  Some recent data might be hidden    Patient Care Team: Guadalupe Maple, MD as PCP - General (Family Medicine) Guadalupe Maple, MD (Family Medicine) Guadalupe Maple, MD (Family Medicine) Minna Merritts, MD as Consulting Physician (Cardiology) [name], MD (Dermatology)    Assessment:     Exercise Activities and Dietary recommendations Current Exercise Habits: The patient does not participate in regular exercise at present, Exercise limited by: None identified  Goals    . Increase water intake          Recommend drinking at  least 3-4 glasses a day.       Fall Risk Fall Risk  10/14/2016 07/22/2016 06/17/2016 12/03/2015 11/22/2014  Falls in the past year? No No No No No   Depression Screen PHQ 2/9 Scores 10/14/2016 07/22/2016 12/03/2015 11/22/2014  PHQ - 2 Score 0 0 0 0     Cognitive Function     6CIT Screen 10/14/2016  What Year? 0 points  What month? 0 points  What time? 0 points  Count back from 20 0 points  Months in reverse 0 points  Repeat phrase 0 points  Total Score 0    Immunization History  Administered Date(s) Administered  . Influenza, High Dose Seasonal PF 01/31/2016  . Influenza,inj,Quad PF,36+ Mos 01/15/2015  . Influenza-Unspecified 01/10/2014  . Pneumococcal Conjugate-13 09/13/2013  . Pneumococcal-Unspecified 03/30/2005   Screening Tests Health Maintenance  Topic Date Due  . TETANUS/TDAP  03/30/2017 (Originally 10/27/1950)  . INFLUENZA VACCINE  10/28/2016  . DEXA SCAN  Completed  . PNA vac Low Risk Adult  Completed      Plan:     I have personally reviewed and addressed the Medicare Annual Wellness questionnaire and have noted the following in the patient's chart:  A. Medical and social history B. Use of alcohol, tobacco or illicit drugs  C. Current medications and supplements D. Functional ability and status E.  Nutritional status F.  Physical activity G. Advance directives H. List of other physicians I.  Hospitalizations, surgeries, and ER visits in previous 12 months J.  [place] such as hearing and vision if needed, cognitive and depression L. Referrals and appointments   In addition, I have reviewed and discussed with patient certain preventive protocols, quality metrics, and best practice recommendations. A written personalized care plan for preventive services as well as general preventive health recommendations were provided to patient.   Signed,  Tyler Aas, LPN Nurse Health Advisor   MD Recommendations: none

## 2016-12-14 ENCOUNTER — Other Ambulatory Visit: Payer: Self-pay | Admitting: Family Medicine

## 2016-12-14 DIAGNOSIS — I10 Essential (primary) hypertension: Secondary | ICD-10-CM

## 2017-01-27 ENCOUNTER — Ambulatory Visit (INDEPENDENT_AMBULATORY_CARE_PROVIDER_SITE_OTHER): Payer: Medicare HMO

## 2017-01-27 DIAGNOSIS — Z23 Encounter for immunization: Secondary | ICD-10-CM | POA: Diagnosis not present

## 2017-02-10 ENCOUNTER — Other Ambulatory Visit: Payer: Self-pay | Admitting: Family Medicine

## 2017-02-17 ENCOUNTER — Encounter: Payer: Self-pay | Admitting: Family Medicine

## 2017-02-17 ENCOUNTER — Ambulatory Visit (INDEPENDENT_AMBULATORY_CARE_PROVIDER_SITE_OTHER): Payer: Medicare HMO | Admitting: Family Medicine

## 2017-02-17 ENCOUNTER — Ambulatory Visit
Admission: RE | Admit: 2017-02-17 | Discharge: 2017-02-17 | Disposition: A | Discharge status: Home / Self Care | Payer: Medicare HMO | Source: Ambulatory Visit | Attending: Family Medicine | Admitting: Family Medicine

## 2017-02-17 VITALS — BP 176/77 | HR 80 | Ht 65.75 in | Wt 181.0 lb

## 2017-02-17 DIAGNOSIS — Z1329 Encounter for screening for other suspected endocrine disorder: Secondary | ICD-10-CM

## 2017-02-17 DIAGNOSIS — E785 Hyperlipidemia, unspecified: Secondary | ICD-10-CM

## 2017-02-17 DIAGNOSIS — M25551 Pain in right hip: Secondary | ICD-10-CM | POA: Diagnosis not present

## 2017-02-17 DIAGNOSIS — Z7189 Other specified counseling: Secondary | ICD-10-CM | POA: Insufficient documentation

## 2017-02-17 DIAGNOSIS — I10 Essential (primary) hypertension: Secondary | ICD-10-CM

## 2017-02-17 DIAGNOSIS — M79606 Pain in leg, unspecified: Secondary | ICD-10-CM

## 2017-02-17 DIAGNOSIS — M8580 Other specified disorders of bone density and structure, unspecified site: Secondary | ICD-10-CM | POA: Insufficient documentation

## 2017-02-17 DIAGNOSIS — Z131 Encounter for screening for diabetes mellitus: Secondary | ICD-10-CM

## 2017-02-17 DIAGNOSIS — M25751 Osteophyte, right hip: Secondary | ICD-10-CM | POA: Diagnosis not present

## 2017-02-17 DIAGNOSIS — M1611 Unilateral primary osteoarthritis, right hip: Secondary | ICD-10-CM | POA: Diagnosis not present

## 2017-02-17 DIAGNOSIS — Z0000 Encounter for general adult medical examination without abnormal findings: Secondary | ICD-10-CM

## 2017-02-17 DIAGNOSIS — G8929 Other chronic pain: Secondary | ICD-10-CM | POA: Diagnosis not present

## 2017-02-17 DIAGNOSIS — I2510 Atherosclerotic heart disease of native coronary artery without angina pectoris: Secondary | ICD-10-CM

## 2017-02-17 LAB — URINALYSIS, ROUTINE W REFLEX MICROSCOPIC
Bilirubin, UA: NEGATIVE
Glucose, UA: NEGATIVE
Ketones, UA: NEGATIVE
Nitrite, UA: NEGATIVE
PH UA: 5.5 (ref 5.0–7.5)
PROTEIN UA: NEGATIVE
Specific Gravity, UA: 1.015 (ref 1.005–1.030)
Urobilinogen, Ur: 1 mg/dL (ref 0.2–1.0)

## 2017-02-17 LAB — MICROSCOPIC EXAMINATION

## 2017-02-17 MED ORDER — CARVEDILOL 25 MG PO TABS: 25.0000 mg | ORAL_TABLET | Freq: Two times a day (BID) | ORAL | 4 refills | Status: DC

## 2017-02-17 MED ORDER — HYDROCHLOROTHIAZIDE 25 MG PO TABS: 25.0000 mg | ORAL_TABLET | Freq: Every day | ORAL | 4 refills | Status: DC

## 2017-02-17 NOTE — Assessment & Plan Note (Signed)
discuss elevated blood pressure today will observe by checking blood pressure at home and if not doing well will reevaluate. Otherwise continue current medications.

## 2017-02-17 NOTE — Assessment & Plan Note (Signed)
The current medical regimen is effective;  continue present plan and medications.  

## 2017-02-17 NOTE — Progress Notes (Signed)
BP (!) 176/77   Pulse 80   Ht 5' 5.75" (1.67 m)   Wt 181 lb (82.1 kg)   SpO2 97%   BMI 29.44 kg/m    Subjective:    Patient ID: Deborah Gibson, female    DOB: Jun 16, 1931, 83 y.o.   MRN: [id number]  HPI: Deborah Gibson is a 83 y.o. female  Chief Complaint  Patient presents with  . Annual Exam  . Hip Pain  patient follow-up main concern is right hip pain. Has a mixed bag of symptoms with some radicular symptoms some arthritis symptoms an some vascular symptoms. Reviewed last year's note for vascular with no vascular disease identified.Reviewed CT of pelvis showing arthritis in her spine area.Patient hasn't had any hip x-rays Blood pressure elevated today on repeat 160/60 hasn't taken carvedilol 25 twice a day has taken other medications today.   Relevant past medical, surgical, family and social history reviewed and updated as indicated. Interim medical history since our last visit reviewed. Allergies and medications reviewed and updated.  Review of Systems  Constitutional: Negative.   HENT: Negative.   Eyes: Negative.   Respiratory: Negative.   Cardiovascular: Negative.   Gastrointestinal: Negative.   Endocrine: Negative.   Genitourinary: Negative.   Musculoskeletal: Negative.   Skin: Negative.   Allergic/Immunologic: Negative.   Neurological: Negative.   Hematological: Negative.   Psychiatric/Behavioral: Negative.     Per HPI unless specifically indicated above     Objective:    BP (!) 176/77   Pulse 80   Ht 5' 5.75" (1.67 m)   Wt 181 lb (82.1 kg)   SpO2 97%   BMI 29.44 kg/m   Wt Readings from Last 3 Encounters:  02/17/17 181 lb (82.1 kg)  10/14/16 178 lb 12.8 oz (81.1 kg)  08/03/16 180 lb (81.6 kg)    Physical Exam  Constitutional: She is oriented to person, place, and time. She appears well-developed and well-nourished.  HENT:  Head: Normocephalic and atraumatic.  Right Ear: External ear normal.  Left Ear: External ear normal.  Nose: Nose  normal.  Mouth/Throat: Oropharynx is clear and moist.  Eyes: Conjunctivae and EOM are normal. Pupils are equal, round, and reactive to light.  Neck: Normal range of motion. Neck supple. Carotid bruit is not present.  Cardiovascular: Normal rate, regular rhythm and normal heart sounds.  No murmur heard. Pulmonary/Chest: Effort normal and breath sounds normal. She exhibits no mass. Right breast exhibits no mass, no skin change and no tenderness. Left breast exhibits no mass, no skin change and no tenderness. Breasts are symmetrical.  Abdominal: Soft. Bowel sounds are normal. There is no hepatosplenomegaly.  Musculoskeletal: Normal range of motion.  Neurological: She is alert and oriented to person, place, and time.  Skin: No rash noted.  Psychiatric: She has a normal mood and affect. Her behavior is normal. Judgment and thought content normal.    Results for orders placed or performed in visit on 77/41/28  Basic metabolic panel  Result Value Ref Range   Glucose 99 65 - 99 mg/dL   BUN 17 8 - 27 mg/dL   Creatinine, Ser 0.72 0.57 - 1.00 mg/dL   GFR calc non Af Amer 78 >59 mL/min/1.73   GFR calc Af Amer 90 >59 mL/min/1.73   BUN/Creatinine Ratio 24 12 - 28   Sodium 142 134 - 144 mmol/L   Potassium 4.3 3.5 - 5.2 mmol/L   Chloride 99 96 - 106 mmol/L   CO2 25 18 - 29  mmol/L   Calcium 9.4 8.7 - 10.3 mg/dL      Assessment & Plan:   Problem List Items Addressed This Visit      Cardiovascular and Mediastinum   Essential hypertension - Primary    discuss elevated blood pressure today will observe by checking blood pressure at home and if not doing well will reevaluate. Otherwise continue current medications.      Relevant Medications   carvedilol (COREG) 25 MG tablet   hydrochlorothiazide (HYDRODIURIL) 25 MG tablet   Other Relevant Orders   CBC with Differential/Platelet   Comprehensive metabolic panel   Urinalysis, Routine w reflex microscopic   Coronary artery disease    The current  medical regimen is effective;  continue present plan and medications.       Relevant Medications   carvedilol (COREG) 25 MG tablet   hydrochlorothiazide (HYDRODIURIL) 25 MG tablet     Other   Hyperlipidemia   Relevant Medications   carvedilol (COREG) 25 MG tablet   hydrochlorothiazide (HYDRODIURIL) 25 MG tablet   Other Relevant Orders   Lipid panel   Leg pain    Discuss and review right hip and leg pain No claudication on review of notes from last year x-ray from CT review showing some arthritis changes but nothing significant to concern about radicular issues. We will get x-ray of right hip and assess post x-ray.      Relevant Orders   DG HIP UNILAT WITH PELVIS 2-3 VIEWS RIGHT   Advanced care planning/counseling discussion    A voluntary discussion about advance care planning including the explanation and discussion of advance directives was extensively discussed  with the patient.  Explanation about the health care proxy and Living will was reviewed and packet with forms with explanation of how to fill them out was given.          Other Visit Diagnoses    Thyroid disorder screen       Relevant Orders   TSH   Screening for diabetes mellitus (DM)       Relevant Orders   CBC with Differential/Platelet   Comprehensive metabolic panel   Urinalysis, Routine w reflex microscopic   PE (physical exam), annual           Follow up plan: Return in about 6 months (around 08/17/2017), or if symptoms worsen or fail to improve, for BMP,  Lipids, ALT, AST.

## 2017-02-17 NOTE — Assessment & Plan Note (Addendum)
A voluntary discussion about advance care planning including the explanation and discussion of advance directives was extensively discussed  with the patient.  Explanation about the health care proxy and Living will was reviewed and packet with forms with explanation of how to fill them out was given.    

## 2017-02-17 NOTE — Assessment & Plan Note (Signed)
Discuss and review right hip and leg pain No claudication on review of notes from last year x-ray from CT review showing some arthritis changes but nothing significant to concern about radicular issues. We will get x-ray of right hip and assess post x-ray.

## 2017-02-18 LAB — COMPREHENSIVE METABOLIC PANEL
A/G RATIO: 1.7 (ref 1.2–2.2)
ALBUMIN: 4.5 g/dL (ref 3.5–4.7)
ALK PHOS: 94 IU/L (ref 39–117)
ALT: 23 IU/L (ref 0–32)
AST: 27 IU/L (ref 0–40)
BUN / CREAT RATIO: 19 (ref 12–28)
BUN: 17 mg/dL (ref 8–27)
Bilirubin Total: 0.7 mg/dL (ref 0.0–1.2)
CO2: 26 mmol/L (ref 20–29)
CREATININE: 0.9 mg/dL (ref 0.57–1.00)
Calcium: 9.6 mg/dL (ref 8.7–10.3)
Chloride: 97 mmol/L (ref 96–106)
GFR calc Af Amer: 68 mL/min/{1.73_m2} (ref >59)
GFR calc non Af Amer: 59 mL/min/{1.73_m2} — ABNORMAL LOW (ref >59)
GLOBULIN, TOTAL: 2.7 g/dL (ref 1.5–4.5)
Glucose: 99 mg/dL (ref 65–99)
POTASSIUM: 4.1 mmol/L (ref 3.5–5.2)
SODIUM: 139 mmol/L (ref 134–144)
Total Protein: 7.2 g/dL (ref 6.0–8.5)

## 2017-02-18 LAB — CBC WITH DIFFERENTIAL/PLATELET
BASOS: 0 %
Basophils Absolute: 0 10*3/uL (ref 0.0–0.2)
EOS (ABSOLUTE): 0.1 10*3/uL (ref 0.0–0.4)
EOS: 2 %
HEMATOCRIT: 40.5 % (ref 34.0–46.6)
HEMOGLOBIN: 13.7 g/dL (ref 11.1–15.9)
Immature Grans (Abs): 0 10*3/uL (ref 0.0–0.1)
Immature Granulocytes: 0 %
LYMPHS ABS: 2.1 10*3/uL (ref 0.7–3.1)
Lymphs: 36 %
MCH: 29.9 pg (ref 26.6–33.0)
MCHC: 33.8 g/dL (ref 31.5–35.7)
MCV: 88 fL (ref 79–97)
MONOCYTES: 10 %
MONOS ABS: 0.6 10*3/uL (ref 0.1–0.9)
NEUTROS ABS: 3 10*3/uL (ref 1.4–7.0)
Neutrophils: 52 %
Platelets: 208 10*3/uL (ref 150–379)
RBC: 4.58 x10E6/uL (ref 3.77–5.28)
RDW: 14 % (ref 12.3–15.4)
WBC: 5.8 10*3/uL (ref 3.4–10.8)

## 2017-02-18 LAB — LIPID PANEL
CHOLESTEROL TOTAL: 260 mg/dL — AB (ref 100–199)
Chol/HDL Ratio: 5.5 ratio — ABNORMAL HIGH (ref 0.0–4.4)
HDL: 47 mg/dL (ref >39)
LDL Calculated: 153 mg/dL — ABNORMAL HIGH (ref 0–99)
TRIGLYCERIDES: 301 mg/dL — AB (ref 0–149)
VLDL CHOLESTEROL CAL: 60 mg/dL — AB (ref 5–40)

## 2017-02-18 LAB — TSH: TSH: 2.96 u[IU]/mL (ref 0.450–4.500)

## 2017-02-23 ENCOUNTER — Telehealth: Payer: Self-pay | Admitting: Family Medicine

## 2017-02-23 NOTE — Telephone Encounter (Signed)
Phone call Discussed with patient high cholesterol patient will do better with diet recheck patient and lipid panel ALT, AST and a couple of months.

## 2017-03-03 ENCOUNTER — Telehealth: Payer: Self-pay

## 2017-03-03 DIAGNOSIS — M1611 Unilateral primary osteoarthritis, right hip: Secondary | ICD-10-CM | POA: Insufficient documentation

## 2017-03-03 NOTE — Telephone Encounter (Signed)
Copied from [place] [id number]. Topic: General - Other >> Mar 02, 2017  4:39 PM Conception Chancy, NT wrote: Reason for CRM: patient states she had a X-ray done on her hip and would like the results from that. Please contact pt back

## 2017-03-03 NOTE — Telephone Encounter (Signed)
Patient was transferred to provider for telephone conversation.   

## 2017-03-03 NOTE — Telephone Encounter (Signed)
Phone call Discussed hip arthritis showing on x-ray with patient discussed care and treatment

## 2017-03-03 NOTE — Telephone Encounter (Signed)
Call pt 

## 2017-03-16 DIAGNOSIS — D1800 Hemangioma unspecified site: Secondary | ICD-10-CM | POA: Diagnosis not present

## 2017-03-16 DIAGNOSIS — R21 Rash and other nonspecific skin eruption: Secondary | ICD-10-CM | POA: Diagnosis not present

## 2017-03-16 DIAGNOSIS — L219 Seborrheic dermatitis, unspecified: Secondary | ICD-10-CM | POA: Diagnosis not present

## 2017-03-16 DIAGNOSIS — D229 Melanocytic nevi, unspecified: Secondary | ICD-10-CM | POA: Diagnosis not present

## 2017-03-16 DIAGNOSIS — L820 Inflamed seborrheic keratosis: Secondary | ICD-10-CM | POA: Diagnosis not present

## 2017-03-16 DIAGNOSIS — L821 Other seborrheic keratosis: Secondary | ICD-10-CM | POA: Diagnosis not present

## 2017-03-16 DIAGNOSIS — L578 Other skin changes due to chronic exposure to nonionizing radiation: Secondary | ICD-10-CM | POA: Diagnosis not present

## 2017-03-16 DIAGNOSIS — Z1283 Encounter for screening for malignant neoplasm of skin: Secondary | ICD-10-CM | POA: Diagnosis not present

## 2017-03-16 DIAGNOSIS — Z85828 Personal history of other malignant neoplasm of skin: Secondary | ICD-10-CM | POA: Diagnosis not present

## 2017-05-25 ENCOUNTER — Encounter: Payer: Self-pay | Admitting: Family Medicine

## 2017-06-29 ENCOUNTER — Encounter: Payer: Self-pay | Admitting: Emergency Medicine

## 2017-06-29 ENCOUNTER — Other Ambulatory Visit: Payer: Self-pay

## 2017-06-29 ENCOUNTER — Emergency Department: Payer: Medicare HMO

## 2017-06-29 ENCOUNTER — Observation Stay
Admission: EM | Admit: 2017-06-29 | Discharge: 2017-07-01 | Disposition: A | Discharge status: Home / Self Care | Payer: Medicare HMO | Attending: Internal Medicine | Admitting: Internal Medicine

## 2017-06-29 DIAGNOSIS — I739 Peripheral vascular disease, unspecified: Secondary | ICD-10-CM | POA: Diagnosis not present

## 2017-06-29 DIAGNOSIS — M1990 Unspecified osteoarthritis, unspecified site: Secondary | ICD-10-CM | POA: Diagnosis not present

## 2017-06-29 DIAGNOSIS — I6523 Occlusion and stenosis of bilateral carotid arteries: Secondary | ICD-10-CM | POA: Diagnosis not present

## 2017-06-29 DIAGNOSIS — Z888 Allergy status to other drugs, medicaments and biological substances status: Secondary | ICD-10-CM | POA: Diagnosis not present

## 2017-06-29 DIAGNOSIS — E876 Hypokalemia: Secondary | ICD-10-CM | POA: Insufficient documentation

## 2017-06-29 DIAGNOSIS — Z885 Allergy status to narcotic agent status: Secondary | ICD-10-CM | POA: Diagnosis not present

## 2017-06-29 DIAGNOSIS — Z90710 Acquired absence of both cervix and uterus: Secondary | ICD-10-CM | POA: Diagnosis not present

## 2017-06-29 DIAGNOSIS — G450 Vertebro-basilar artery syndrome: Secondary | ICD-10-CM | POA: Diagnosis present

## 2017-06-29 DIAGNOSIS — I080 Rheumatic disorders of both mitral and aortic valves: Secondary | ICD-10-CM | POA: Diagnosis not present

## 2017-06-29 DIAGNOSIS — E785 Hyperlipidemia, unspecified: Secondary | ICD-10-CM | POA: Diagnosis not present

## 2017-06-29 DIAGNOSIS — Z7982 Long term (current) use of aspirin: Secondary | ICD-10-CM | POA: Insufficient documentation

## 2017-06-29 DIAGNOSIS — R42 Dizziness and giddiness: Secondary | ICD-10-CM | POA: Diagnosis not present

## 2017-06-29 DIAGNOSIS — K224 Dyskinesia of esophagus: Secondary | ICD-10-CM | POA: Insufficient documentation

## 2017-06-29 DIAGNOSIS — Z85828 Personal history of other malignant neoplasm of skin: Secondary | ICD-10-CM | POA: Insufficient documentation

## 2017-06-29 DIAGNOSIS — R1110 Vomiting, unspecified: Secondary | ICD-10-CM | POA: Diagnosis not present

## 2017-06-29 DIAGNOSIS — Z8673 Personal history of transient ischemic attack (TIA), and cerebral infarction without residual deficits: Secondary | ICD-10-CM | POA: Insufficient documentation

## 2017-06-29 DIAGNOSIS — M1611 Unilateral primary osteoarthritis, right hip: Secondary | ICD-10-CM | POA: Insufficient documentation

## 2017-06-29 DIAGNOSIS — Z9849 Cataract extraction status, unspecified eye: Secondary | ICD-10-CM | POA: Insufficient documentation

## 2017-06-29 DIAGNOSIS — Z87891 Personal history of nicotine dependence: Secondary | ICD-10-CM | POA: Diagnosis not present

## 2017-06-29 DIAGNOSIS — R490 Dysphonia: Secondary | ICD-10-CM | POA: Insufficient documentation

## 2017-06-29 DIAGNOSIS — I700 Atherosclerosis of aorta: Secondary | ICD-10-CM | POA: Diagnosis not present

## 2017-06-29 DIAGNOSIS — K219 Gastro-esophageal reflux disease without esophagitis: Secondary | ICD-10-CM | POA: Diagnosis not present

## 2017-06-29 DIAGNOSIS — I10 Essential (primary) hypertension: Secondary | ICD-10-CM | POA: Diagnosis not present

## 2017-06-29 DIAGNOSIS — Z79899 Other long term (current) drug therapy: Secondary | ICD-10-CM | POA: Diagnosis not present

## 2017-06-29 DIAGNOSIS — I2510 Atherosclerotic heart disease of native coronary artery without angina pectoris: Secondary | ICD-10-CM | POA: Diagnosis not present

## 2017-06-29 DIAGNOSIS — I6509 Occlusion and stenosis of unspecified vertebral artery: Secondary | ICD-10-CM | POA: Diagnosis present

## 2017-06-29 LAB — CBC WITH DIFFERENTIAL/PLATELET
BASOS ABS: 0.1 10*3/uL (ref 0–0.1)
BASOS PCT: 1 %
EOS ABS: 0.2 10*3/uL (ref 0–0.7)
EOS PCT: 3 %
HCT: 40.1 % (ref 35.0–47.0)
HEMOGLOBIN: 13.8 g/dL (ref 12.0–16.0)
LYMPHS ABS: 2.2 10*3/uL (ref 1.0–3.6)
Lymphocytes Relative: 23 %
MCH: 29.9 pg (ref 26.0–34.0)
MCHC: 34.4 g/dL (ref 32.0–36.0)
MCV: 87 fL (ref 80.0–100.0)
Monocytes Absolute: 0.8 10*3/uL (ref 0.2–0.9)
Monocytes Relative: 8 %
NEUTROS PCT: 65 %
Neutro Abs: 6.5 10*3/uL (ref 1.4–6.5)
PLATELETS: 220 10*3/uL (ref 150–440)
RBC: 4.61 MIL/uL (ref 3.80–5.20)
RDW: 13.6 % (ref 11.5–14.5)
WBC: 9.8 10*3/uL (ref 3.6–11.0)

## 2017-06-29 LAB — BASIC METABOLIC PANEL
ANION GAP: 11 (ref 5–15)
BUN: 20 mg/dL (ref 6–20)
CO2: 28 mmol/L (ref 22–32)
Calcium: 9.4 mg/dL (ref 8.9–10.3)
Chloride: 103 mmol/L (ref 101–111)
Creatinine, Ser: 0.82 mg/dL (ref 0.44–1.00)
Glucose, Bld: 136 mg/dL — ABNORMAL HIGH (ref 65–99)
POTASSIUM: 2.8 mmol/L — AB (ref 3.5–5.1)
SODIUM: 142 mmol/L (ref 135–145)

## 2017-06-29 LAB — TROPONIN I

## 2017-06-29 MED ORDER — ONDANSETRON HCL 4 MG/2ML IJ SOLN
4.0000 mg | Freq: Once | INTRAMUSCULAR | Status: AC
  Administered 2017-06-29: 4 mg via INTRAVENOUS
  Filled 2017-06-29: qty 2

## 2017-06-29 MED ORDER — MECLIZINE HCL 25 MG PO TABS
ORAL_TABLET | ORAL | Status: AC
  Administered 2017-06-29: 25 mg via ORAL
  Filled 2017-06-29: qty 1

## 2017-06-29 MED ORDER — MECLIZINE HCL 25 MG PO TABS
25.0000 mg | ORAL_TABLET | Freq: Once | ORAL | Status: AC
  Administered 2017-06-29: 25 mg via ORAL

## 2017-06-29 MED ORDER — DIAZEPAM 2 MG PO TABS
2.0000 mg | ORAL_TABLET | Freq: Once | ORAL | Status: AC
  Administered 2017-06-30: 2 mg via ORAL
  Filled 2017-06-29: qty 1

## 2017-06-29 MED ORDER — SODIUM CHLORIDE 0.9 % IV BOLUS
1000.0000 mL | Freq: Once | INTRAVENOUS | Status: AC
  Administered 2017-06-29: 1000 mL via INTRAVENOUS

## 2017-06-29 MED ORDER — MECLIZINE HCL 25 MG PO TABS: 25.0000 mg | ORAL_TABLET | Freq: Once | ORAL | Status: DC

## 2017-06-29 MED ORDER — SODIUM CHLORIDE 0.9 % IV BOLUS: 1000.0000 mL | Freq: Once | INTRAVENOUS | Status: DC

## 2017-06-29 MED ORDER — MECLIZINE HCL 25 MG PO TABS
25.0000 mg | ORAL_TABLET | Freq: Once | ORAL | Status: DC
  Filled 2017-06-29: qty 1

## 2017-06-29 MED ORDER — MECLIZINE HCL 25 MG PO TABS: 25.0000 mg | ORAL_TABLET | Freq: Three times a day (TID) | ORAL | 0 refills | Status: DC | PRN

## 2017-06-29 NOTE — ED Notes (Signed)
Three unsuccessful attempts at 20G for CTA. IV team consult placed.

## 2017-06-29 NOTE — ED Triage Notes (Signed)
Patient from home via [facility]. Patient reports sudden onset dizziness and nausea approximately 7 pm. Patient had multiple episodes of vomiting with EMS. Reports nausea worse with movement. States "everything is spinning". Denies history of vertigo. Given 4 mg IV zofran by EMS. Patient alert and oriented x4.

## 2017-06-29 NOTE — ED Provider Notes (Signed)
[facility] Emergency Department Provider Note  ____________________________________________   First MD Initiated Contact with Patient 06/29/17 2129     (approximate)  I have reviewed the triage vital signs and the nursing notes.   HISTORY  Chief Complaint Dizziness and Emesis   HPI Deborah Gibson is a 84 y.o. female with a history of arthritis as well as hyperlipidemia and PVD on aspirin who is presenting to the emergency department today with vertigo starting 2 hours ago.  Says that she feels dizzy sensation which encompasses her whole head every time she moves her head left or right or opens her eyes.  She says that she also feels pressure to the back of her head and neck as well which is chronic and unchanged.  Given Zofran in route with EMS.  Says that she has had an episode of vertigo once in the past the last for 5 minutes but then resolved spontaneously.  Has never seen a doctor for it or taken meclizine or any other antivertigo medications.  Denies any pressure or ringing to ears.  Denies any recent viral illnesses.   Past Medical History:  Diagnosis Date  . Arthritis   . Cancer ([place])    skin ca  . GERD (gastroesophageal reflux disease)   . Hyperlipidemia   . Seasonal allergies     Patient Active Problem List   Diagnosis Date Noted  . Osteoarthritis of right hip 03/03/2017  . Advanced care planning/counseling discussion 02/17/2017  . Calculus of gallbladder without cholecystitis without obstruction 02/24/2016  . PVD (peripheral vascular disease) ([place]) 01/29/2016  . Leg pain 12/03/2015  . Hoarseness of voice 11/22/2014  . Hyperlipidemia   . Coronary artery disease 05/23/2014  . Hyperlipidemia 05/01/2014  . Family history of early CAD 05/01/2014  . Essential hypertension 05/01/2014  . Smoking history 05/01/2014  . Esophageal dysmotility 05/01/2014  . Gastroesophageal reflux disease without esophagitis 05/01/2014    Past Surgical  History:  Procedure Laterality Date  . ABDOMINAL HYSTERECTOMY    . BLADDER SUSPENSION    . [place], [place]  . CATARACT EXTRACTION    . CORONARY ANGIOPLASTY  2016  . CYSTOCELE REPAIR    . ESOPHAGOGASTRODUODENOSCOPY (EGD) WITH PROPOFOL N/A 03/12/2016   Procedure: ESOPHAGOGASTRODUODENOSCOPY (EGD) WITH PROPOFOL;  Surgeon: Jonathon Bellows, MD;  Location: [facility] ENDOSCOPY;  Service: Endoscopy;  Laterality: N/A;  . EYE SURGERY     cataract surgery  . rectal vaginal fistula repair    . TONSILLECTOMY    . VAGINAL HYSTERECTOMY      Prior to Admission medications   Medication Sig Start Date End Date Taking? Authorizing Provider  aspirin EC 81 MG tablet Take 81 mg by mouth daily.    [provider]  carvedilol (COREG) 25 MG tablet Take 1 tablet (25 mg total) by mouth 2 (two) times daily with a meal. 02/17/17   Crissman, Jeannette How, MD  cetirizine (ZYRTEC) 10 MG tablet Take 10 mg by mouth daily.    [provider]  Coenzyme Q10 (COQ10) 200 MG CAPS Take by mouth.    [provider]  fluticasone (FLONASE) 50 MCG/ACT nasal spray Place 2 sprays into both nostrils daily as needed.     [provider]  hydrochlorothiazide (HYDRODIURIL) 25 MG tablet Take 1 tablet (25 mg total) by mouth daily. 02/17/17   Guadalupe Maple, MD  pantoprazole (PROTONIX) 40 MG tablet Take 40 mg by mouth daily.    [provider]  vitamin B-12 (CYANOCOBALAMIN) 500 MCG tablet Take 500 mcg by mouth daily.    [provider]    Allergies Scopolamine; Amlodipine; Amlodipine; Benazepril; Codeine; Crestor [rosuvastatin]; Lovastatin; Scopolamine; Statins; and Zocor [simvastatin]  Family History  Problem Relation Age of Onset  . Hypertension Mother   . Heart failure Mother   . Hyperlipidemia Mother   . Stroke Mother   . Cancer Sister   . Heart disease Sister   . Hypertension Sister   . Heart disease Father   . Diabetes Brother   . Heart disease  Brother        pacer/defiib  . Hypertension Brother   . Heart attack Brother   . Heart disease Brother        CABG  . Leukemia Daughter     Social History Social History   Tobacco Use  . Smoking status: Former Smoker    Packs/day: 3.00    Years: 23.00    Pack years: 69.00    Types: Cigarettes    Last attempt to quit: 04/15/1978    Years since quitting: 39.2  . Smokeless tobacco: Never Used  Substance Use Topics  . Alcohol use: No    Alcohol/week: 0.0 oz  . Drug use: No    Review of Systems  Constitutional: No fever/chills Eyes: No visual changes. ENT: No sore throat. Cardiovascular: Denies chest pain. Respiratory: Denies shortness of breath. Gastrointestinal: No abdominal pain.  No nausea, no vomiting.  No diarrhea.  No constipation. Genitourinary: Negative for dysuria. Musculoskeletal: Negative for back pain. Skin: Negative for rash. Neurological: Negative for headaches, focal weakness or numbness.   ____________________________________________   PHYSICAL EXAM:  VITAL SIGNS: ED Triage Vitals  Enc Vitals Group     BP 06/29/17 2114 (!) 159/65     Pulse Rate 06/29/17 2114 68     Resp 06/29/17 2114 (!) 24     Temp 06/29/17 2114 (!) 97.2 F (36.2 C)     Temp Source 06/29/17 2114 Axillary     SpO2 06/29/17 2114 100 %     Weight 06/29/17 2115 179 lb (81.2 kg)     Height 06/29/17 2115 5\' 5"  (1.651 m)     Head Circumference --      Peak Flow --      Pain Score 06/29/17 2115 0     Pain Loc --      Pain Edu? --      Excl. in [place]? --     Constitutional: Alert and oriented. Well appearing and in no acute distress. Eyes: Conjunctivae are normal.  Head: Atraumatic.  Normal TMs bilaterally. Nose: No congestion/rhinnorhea. Mouth/Throat: Mucous membranes are moist.  Neck: No stridor.  No tenderness to the cervical spine.  No deformity or step-off. Cardiovascular: Normal rate, regular rhythm. Grossly normal heart sounds.   Respiratory: Normal respiratory effort.   No retractions. Lungs CTAB. Gastrointestinal: Soft and nontender. No distention. No CVA tenderness. Musculoskeletal: No lower extremity tenderness nor edema.  No joint effusions. Neurologic:  Normal speech and language. No gross focal neurologic deficits are appreciated.  No nystagmus. Skin:  Skin is warm, dry and intact. No rash noted. Psychiatric: Mood and affect are normal. Speech and behavior are normal.  ____________________________________________   LABS (all labs ordered are listed, but only abnormal results are displayed)  Labs Reviewed  BASIC METABOLIC PANEL - Abnormal; Notable for the following components:      Result Value   Potassium 2.8 (*)    Glucose, Bld  136 (*)    All other components within normal limits  CBC WITH DIFFERENTIAL/PLATELET  TROPONIN I   ____________________________________________  EKG  ED ECG REPORT I, Doran Stabler, the attending physician, personally viewed and interpreted this ECG.   Date: 06/29/2017  EKG Time: 2116  Rate: 67  Rhythm: normal sinus rhythm  Axis: Normal  Intervals:none  ST&T Change: No ST segment elevation or depression.  Single T wave inversion in aVL.  EKG machine likely reading minimal ST elevation secondary to wandering baseline  ____________________________________________  RADIOLOGY  CTA noncontrast without any acute finding. ____________________________________________   PROCEDURES  Procedure(s) performed:   Procedures  Critical Care performed:   ____________________________________________   INITIAL IMPRESSION / ASSESSMENT AND PLAN / ED COURSE  Pertinent labs & imaging results that were available during my care of the patient were reviewed by me and considered in my medical decision making (see chart for details).  DDX: CVA, vertigo, labyrinthitis, vertebral artery dissection, Mnire's disease As part of my medical decision making, I reviewed the following data within the electronic medical  record:  Notes from prior ED visits  ----------------------------------------- 11:32 PM on 06/29/2017 -----------------------------------------  Patient at this time feeling slightly improved.  Is able to open her eyes and look around.  However, still feels pressure to the back of her head.  I was able to test her finger-to-nose as well as heel-to-shin testing and there is no ataxia found.  However, she still has vertiginous symptoms despite meclizine and fluids.  She will be given p.o. Valium and an angiography of her head neck will be performed.  Signed out to Dr. Dahlia Client. ____________________________________________   FINAL CLINICAL IMPRESSION(S) / ED DIAGNOSES  Vertigo.    NEW MEDICATIONS STARTED DURING THIS VISIT:  New Prescriptions   No medications on file     Note:  This document was prepared using Dragon voice recognition software and may include unintentional dictation errors.     Orbie Pyo, MD 06/29/17 [phone]

## 2017-06-29 NOTE — ED Notes (Signed)
Patient vomited immediately after taking meclizine. Pill seen in emesis bag. MD aware. Additional nausea medication ordered. Meclizine wasted in pyxis. Will attempt to give again after administration of nausea meds.

## 2017-06-30 ENCOUNTER — Other Ambulatory Visit: Payer: Self-pay

## 2017-06-30 ENCOUNTER — Observation Stay (HOSPITAL_BASED_OUTPATIENT_CLINIC_OR_DEPARTMENT_OTHER)
Admit: 2017-06-30 | Discharge: 2017-06-30 | Disposition: A | Discharge status: Home / Self Care | Payer: Medicare HMO | Attending: Internal Medicine | Admitting: Internal Medicine

## 2017-06-30 ENCOUNTER — Observation Stay: Payer: Medicare HMO

## 2017-06-30 ENCOUNTER — Emergency Department: Payer: Medicare HMO

## 2017-06-30 DIAGNOSIS — I340 Nonrheumatic mitral (valve) insufficiency: Secondary | ICD-10-CM

## 2017-06-30 DIAGNOSIS — I351 Nonrheumatic aortic (valve) insufficiency: Secondary | ICD-10-CM | POA: Diagnosis not present

## 2017-06-30 DIAGNOSIS — I6509 Occlusion and stenosis of unspecified vertebral artery: Secondary | ICD-10-CM | POA: Diagnosis present

## 2017-06-30 DIAGNOSIS — I10 Essential (primary) hypertension: Secondary | ICD-10-CM | POA: Diagnosis not present

## 2017-06-30 DIAGNOSIS — R42 Dizziness and giddiness: Secondary | ICD-10-CM

## 2017-06-30 DIAGNOSIS — I6523 Occlusion and stenosis of bilateral carotid arteries: Secondary | ICD-10-CM | POA: Diagnosis not present

## 2017-06-30 DIAGNOSIS — I2510 Atherosclerotic heart disease of native coronary artery without angina pectoris: Secondary | ICD-10-CM | POA: Diagnosis not present

## 2017-06-30 LAB — ECHOCARDIOGRAM COMPLETE
Height: 65 in
Weight: 2864 oz

## 2017-06-30 LAB — LIPID PANEL
CHOL/HDL RATIO: 5 ratio
Cholesterol: 219 mg/dL — ABNORMAL HIGH (ref 0–200)
HDL: 44 mg/dL (ref >40)
LDL CALC: 136 mg/dL — AB (ref 0–99)
TRIGLYCERIDES: 196 mg/dL — AB (ref <150)
VLDL: 39 mg/dL (ref 0–40)

## 2017-06-30 LAB — CBC
HEMATOCRIT: 38.5 % (ref 35.0–47.0)
HEMOGLOBIN: 13.4 g/dL (ref 12.0–16.0)
MCH: 29.7 pg (ref 26.0–34.0)
MCHC: 34.7 g/dL (ref 32.0–36.0)
MCV: 85.6 fL (ref 80.0–100.0)
Platelets: 198 10*3/uL (ref 150–440)
RBC: 4.49 MIL/uL (ref 3.80–5.20)
RDW: 13.2 % (ref 11.5–14.5)
WBC: 8.2 10*3/uL (ref 3.6–11.0)

## 2017-06-30 LAB — HEMOGLOBIN A1C
Hgb A1c MFr Bld: 6 % — ABNORMAL HIGH (ref 4.8–5.6)
Mean Plasma Glucose: 125.5 mg/dL

## 2017-06-30 LAB — CREATININE, SERUM
Creatinine, Ser: 0.76 mg/dL (ref 0.44–1.00)
GFR calc Af Amer: >60 mL/min (ref >60)

## 2017-06-30 MED ORDER — ACETAMINOPHEN 160 MG/5ML PO SOLN: 650.0000 mg | ORAL | Status: DC | PRN

## 2017-06-30 MED ORDER — ENOXAPARIN SODIUM 40 MG/0.4ML ~~LOC~~ SOLN
40.0000 mg | SUBCUTANEOUS | Status: DC
  Administered 2017-06-30: 20:00:00 40 mg via SUBCUTANEOUS
  Filled 2017-06-30: qty 0.4

## 2017-06-30 MED ORDER — ACETAMINOPHEN 650 MG RE SUPP: 650.0000 mg | RECTAL | Status: DC | PRN

## 2017-06-30 MED ORDER — IOPAMIDOL (ISOVUE-370) INJECTION 76%
100.0000 mL | Freq: Once | INTRAVENOUS | Status: AC | PRN
  Administered 2017-06-30: 100 mL via INTRAVENOUS

## 2017-06-30 MED ORDER — PANTOPRAZOLE SODIUM 40 MG PO TBEC
40.0000 mg | DELAYED_RELEASE_TABLET | Freq: Every day | ORAL | Status: DC
  Administered 2017-06-30 – 2017-07-01 (×2): 40 mg via ORAL
  Filled 2017-06-30 (×2): qty 1

## 2017-06-30 MED ORDER — STROKE: EARLY STAGES OF RECOVERY BOOK
Freq: Once | Status: AC
  Administered 2017-06-30: 07:00:00

## 2017-06-30 MED ORDER — ASPIRIN EC 81 MG PO TBEC
81.0000 mg | DELAYED_RELEASE_TABLET | Freq: Every day | ORAL | Status: DC
  Administered 2017-06-30 – 2017-07-01 (×2): 81 mg via ORAL
  Filled 2017-06-30 (×2): qty 1

## 2017-06-30 MED ORDER — POTASSIUM CHLORIDE CRYS ER 20 MEQ PO TBCR
40.0000 meq | EXTENDED_RELEASE_TABLET | Freq: Once | ORAL | Status: AC
  Administered 2017-06-30: 40 meq via ORAL
  Filled 2017-06-30: qty 1
  Filled 2017-06-30: qty 2

## 2017-06-30 MED ORDER — ACETAMINOPHEN 325 MG PO TABS: 650.0000 mg | ORAL_TABLET | ORAL | Status: DC | PRN

## 2017-06-30 NOTE — Progress Notes (Signed)
SLP Cancellation Note  Patient Details Name: Deborah Gibson MRN: [id number] DOB: 08-16-1931   Cancelled treatment:       Reason Eval/Treat Not Completed: SLP screened, no needs identified, will sign off(chart reviewed; consulted NSG then met w/ pt). Pt denied any difficulty swallowing and is currently on a regular diet; tolerates swallowing pills w/ water per NSG. Pt conversed at conversational level w/out deficits noted; pt and pasotr denied any notable speech-language deficits.  No further skilled ST services indicated as pt appears at her baseline. Pt agreed. NSG to reconsult if any change in status.    Orinda Kenner, MS, CCC-SLP Brenlee Koskela 06/30/2017, 9:59 AM

## 2017-06-30 NOTE — Progress Notes (Signed)
Admitted today morning for dizziness and emesis and found to have vertebrobasilar insufficiency, patient to have MRI of the brain, echo, carotid ultrasound, neurology evaluation. LABS medications reviewed. Severe hypokalemia secondary to nausea, vomiting, patient to get potassium supplements.  Patient MRI of the brain did not show acute stroke.  # #2 hyperlipidemia: Start statins. 3 dizziness, admitted for evaluation of stroke.  Patient had MRI of the brain which showed no stroke, , Waiting for physical therapy evaluation and also echocardiogram results patient is on Valium, aspirin, start on statins because of elevated  LDL.

## 2017-06-30 NOTE — ED Provider Notes (Signed)
-----------------------------------------   3:11 AM on 06/30/2017 -----------------------------------------   Blood pressure 137/86, pulse 74, temperature (!) 97.2 F (36.2 C), temperature source Axillary, resp. rate 16, height 5\' 5"  (1.651 m), weight 81.2 kg (179 lb), SpO2 95 %.  Assuming care from Dr. Clearnce Hasten.  In short, Deborah Gibson is a 84 y.o. female with a chief complaint of Dizziness and Emesis .  Refer to the original H&P for additional details.  The current plan of care is to follow up the results of the CTA and reassess the patient.     CT angios head and neck: No aneurysm, occlusion or hemodynamically significant stenosis of the intracranial arteries, severe right and mild left vertebral artery origin stenosis otherwise the vertebral arteries are normal.  I did go back into reassess the patient.  She reports that she does still have some dizziness.  Given the fact that the patient has some origin stenosis I am concerned for possible vertebrobasilar insufficiency.  I will admit the patient to the hospitalist service and have her further evaluated.   Loney Hering, MD 06/30/17 [phone]

## 2017-06-30 NOTE — ED Notes (Signed)
Patient assisted to ambulate to toilet. Able to walk without assistance. Reports improvement in dizziness and nausea. Denies any pain at this time.

## 2017-06-30 NOTE — Progress Notes (Signed)
OT Cancellation Note  Patient Details Name: Deborah Gibson MRN: [id number] DOB: Jan 30, 1932   Cancelled Treatment:      OT consult received and chart reviewed, patient is pending neuro consult. MRI negative for acute CVA. Pt also with low K+ at 2.8. Will hold patient this date, continue to monitor and await further results.   Amy T Lovett, OTR/L, CLT  Lovett,Amy 06/30/2017, 11:26 AM

## 2017-06-30 NOTE — Progress Notes (Signed)
*  PRELIMINARY RESULTS* Echocardiogram 2D Echocardiogram has been performed.  Deborah Gibson 06/30/2017, 1:21 PM

## 2017-06-30 NOTE — Care Management Obs Status (Signed)
[facility] NOTIFICATION   Patient Details  Name: Deborah Gibson MRN: [id number] Date of Birth: 08-15-1931   Medicare Observation Status Notification Given:  Yes    Shelbie Ammons, RN 06/30/2017, 8:34 AM

## 2017-06-30 NOTE — Progress Notes (Signed)
PT Cancellation Note  Patient Details Name: Deborah Gibson MRN: [id number] DOB: Nov 10, 1931   Cancelled Treatment:    Reason Eval/Treat Not Completed: Other (comment).  Still awaiting  Neuro consultation and imaging so will defer to AM to see what the plan will be for tx.     Ramond Dial 06/30/2017, 4:33 PM   Mee Hives, PT MS Acute Rehab Dept. Number: [place]

## 2017-06-30 NOTE — Care Management Note (Signed)
Case Management Note  Patient Details  Name: Deborah Gibson MRN: [id number] Date of Birth: July 13, 1931  Subjective/Objective:   Admitted to [facility] under observation status with the diagnosis of dizziness. Lives alone. Son is Elta Guadel[place] [phone]). Last seen Dr. Jeananne Rama 02/17/17. Prescriptions are filled at [facility] in [place]. No home health. No skilled nursing. No medical equipment in the home. No falls. "Too good of an appetite." Takes care of all basic and instrumental activities of daily living herself, drives. Son will transport.  Action/Plan: No folllow-up needs identified at this time   Expected Discharge Date:  07/01/17               Expected Discharge Plan:     In-House Referral:     Discharge planning Services     Post Acute Care Choice:    Choice offered to:     DME Arranged:    DME Agency:     HH Arranged:    HH Agency:     Status of Service:     If discussed at [employer], dates discussed:    Additional Comments:  Shelbie Ammons, RN MSN CCM Care Management [phone] 06/30/2017, 8:34 AM

## 2017-06-30 NOTE — Consult Note (Signed)
Reason for Consult:Dizziness Referring Physician: Vianne Bulls  CC: Dizziness  HPI: Deborah Gibson is an 84 y.o. female who reports that last evening had the acute onset of dizziness while sitting in her recliner.  Symptoms were described as vertigo.  Were worse with movement.  Were accompanied by nausea and vomiting.  Patient was seen in the ED and vestibular maneuvers were attempted without improvement in her symptoms.  Patient was admitted for further evaluation at that time.  Patient reports having a short, similar episode about a week ago.  No complaints of hearing abnormalities.  Reports hearing throbbing in her ears from her heartbeat.   Past Medical History:  Diagnosis Date  . Arthritis   . Cancer ([place])    skin ca  . GERD (gastroesophageal reflux disease)   . Hyperlipidemia   . Hypertension   . Seasonal allergies     Past Surgical History:  Procedure Laterality Date  . ABDOMINAL HYSTERECTOMY    . BLADDER SUSPENSION    . [place], [place]  . CATARACT EXTRACTION    . CORONARY ANGIOPLASTY  2016  . CYSTOCELE REPAIR    . ESOPHAGOGASTRODUODENOSCOPY (EGD) WITH PROPOFOL N/A 03/12/2016   Procedure: ESOPHAGOGASTRODUODENOSCOPY (EGD) WITH PROPOFOL;  Surgeon: Jonathon Bellows, MD;  Location: [facility] ENDOSCOPY;  Service: Endoscopy;  Laterality: N/A;  . EYE SURGERY     cataract surgery  . rectal vaginal fistula repair    . TONSILLECTOMY    . VAGINAL HYSTERECTOMY      Family History  Problem Relation Age of Onset  . Hypertension Mother   . Heart failure Mother   . Hyperlipidemia Mother   . Stroke Mother   . Cancer Sister   . Heart disease Sister   . Hypertension Sister   . Heart disease Father   . Diabetes Brother   . Heart disease Brother        pacer/defiib  . Hypertension Brother   . Heart attack Brother   . Heart disease Brother        CABG  . Leukemia Daughter     Social History:  reports that she quit smoking about 39 years ago. Her smoking  use included cigarettes. She has a 69.00 pack-year smoking history. She has never used smokeless tobacco. She reports that she does not drink alcohol or use drugs.  Allergies  Allergen Reactions  . Scopolamine Anaphylaxis  . Amlodipine   . Amlodipine Swelling  . Benazepril Cough  . Codeine     Sick on stomach   . Crestor [Rosuvastatin] Other (See Comments)    myalgias  . Lovastatin Other (See Comments)    Myalgias   . Scopolamine   . Statins     Legs ache   . Zocor [Simvastatin] Other (See Comments)    myalgias    Medications:  I have reviewed the patient's current medications. Prior to Admission:  Medications Prior to Admission  Medication Sig Dispense Refill Last Dose  . aspirin EC 81 MG tablet Take 81 mg by mouth daily.   Past Week at Unknown time  . carvedilol (COREG) 25 MG tablet Take 1 tablet (25 mg total) by mouth 2 (two) times daily with a meal. 180 tablet 4 06/29/2017 at 0900  . Coenzyme Q10 (COQ10) 200 MG CAPS Take 1 capsule by mouth daily.    06/29/2017 at 0900  . hydrochlorothiazide (HYDRODIURIL) 25 MG tablet Take 1 tablet (25 mg total) by mouth daily. 90 tablet 4 06/29/2017 at Unknown  time  . pantoprazole (PROTONIX) 40 MG tablet Take 40 mg by mouth daily.   06/29/2017 at 0900  . vitamin B-12 (CYANOCOBALAMIN) 500 MCG tablet Take 500 mcg by mouth daily.   06/29/2017 at 0900  . cetirizine (ZYRTEC) 10 MG tablet Take 10 mg by mouth daily.   Not Taking at Unknown time  . fluticasone (FLONASE) 50 MCG/ACT nasal spray Place 2 sprays into both nostrils daily as needed.    Not Taking at prn   Scheduled: . aspirin EC  81 mg Oral Daily  . enoxaparin (LOVENOX) injection  40 mg Subcutaneous Q24H  . meclizine  25 mg Oral Once  . pantoprazole  40 mg Oral Daily    ROS: History obtained from the patient  General ROS: negative for - chills, fatigue, fever, night sweats, weight gain or weight loss Psychological ROS: negative for - behavioral disorder, hallucinations, memory  difficulties, mood swings or suicidal ideation Ophthalmic ROS: negative for - blurry vision, double vision, eye pain or loss of vision ENT ROS: negative for - epistaxis, nasal discharge, oral lesions, sore throat, tinnitus or vertigo Allergy and Immunology ROS: negative for - hives or itchy/watery eyes Hematological and Lymphatic ROS: negative for - bleeding problems, bruising or swollen lymph nodes Endocrine ROS: negative for - galactorrhea, hair pattern changes, polydipsia/polyuria or temperature intolerance Respiratory ROS: negative for - cough, hemoptysis, shortness of breath or wheezing Cardiovascular ROS: negative for - chest pain, dyspnea on exertion, edema or irregular heartbeat Gastrointestinal ROS: negative for - abdominal pain, diarrhea, hematemesis, nausea/vomiting or stool incontinence Genito-Urinary ROS: negative for - dysuria, hematuria, incontinence or urinary frequency/urgency Musculoskeletal ROS: intermittent neck pain with associated dizziness Neurological ROS: as noted in HPI Dermatological ROS: negative for rash and skin lesion changes  Physical Examination: Blood pressure (!) 139/58, pulse 64, temperature 98.2 F (36.8 C), temperature source Oral, resp. rate 17, height 5\' 5"  (1.651 m), weight 81.2 kg (179 lb), SpO2 96 %.  HEENT-  Normocephalic, no lesions, without obvious abnormality.  Normal external eye and conjunctiva.  Normal TM's bilaterally.  Normal auditory canals and external ears. Normal external nose, mucus membranes and septum.  Normal pharynx. Cardiovascular- S1, S2 normal, pulses palpable throughout   Lungs- chest clear, no wheezing, rales, normal symmetric air entry Abdomen- soft, non-tender; bowel sounds normal; no masses,  no organomegaly Extremities- no edema Lymph-no adenopathy palpable Musculoskeletal-no joint tenderness, deformity or swelling Skin-warm and dry, no hyperpigmentation, vitiligo, or suspicious lesions  Neurological Examination    Mental Status: Alert, oriented, thought content appropriate.  Speech fluent without evidence of aphasia.  Able to follow 3 step commands without difficulty. Cranial Nerves: II: Discs flat bilaterally; Visual fields grossly normal, pupils equal, round, reactive to light and accommodation III,IV, VI: ptosis not present, extra-ocular motions intact bilaterally V,VII: smile symmetric, facial light touch sensation normal bilaterally VIII: hearing normal bilaterally IX,X: gag reflex present XI: bilateral shoulder shrug XII: midline tongue extension Motor: Right : Upper extremity   5/5    Left:     Upper extremity   5/5  Lower extremity   5/5     Lower extremity   5/5 Tone and bulk:normal tone throughout; no atrophy noted Sensory: Pinprick and light touch intact throughout, bilaterally Deep Tendon Reflexes: 2+ and symmetric with absent AJ's bilaterally Plantars: Right: downgoing   Left: upgoing Cerebellar: Normal finger-to-nose and normal heel-to-shin testing bilaterally Gait: not tested due to safety concerns    Laboratory Studies:   Basic Metabolic Panel: Recent Labs  Lab  06/29/17 2143 06/30/17 0700  NA 142  --   K 2.8*  --   CL 103  --   CO2 28  --   GLUCOSE 136*  --   BUN 20  --   CREATININE 0.82 0.76  CALCIUM 9.4  --     Liver Function Tests: No results for input(s): AST, ALT, ALKPHOS, BILITOT, PROT, ALBUMIN in the last 168 hours. No results for input(s): LIPASE, AMYLASE in the last 168 hours. No results for input(s): AMMONIA in the last 168 hours.  CBC: Recent Labs  Lab 06/29/17 2143 06/30/17 0700  WBC 9.8 8.2  NEUTROABS 6.5  --   HGB 13.8 13.4  HCT 40.1 38.5  MCV 87.0 85.6  PLT 220 198    Cardiac Enzymes: Recent Labs  Lab 06/29/17 2143  TROPONINI <0.03    BNP: Invalid input(s): POCBNP  CBG: No results for input(s): GLUCAP in the last 168 hours.  Microbiology: Results for orders placed or performed in visit on 02/17/17  Microscopic Examination      Status: Abnormal   Collection Time: 02/17/17  9:08 AM  Result Value Ref Range Status   WBC, UA 6-10 (A) 0 - 5 /hpf Final   RBC, UA 0-2 0 - 2 /hpf Final   Epithelial Cells (non renal) 0-10 0 - 10 /hpf Final   Renal Epithel, UA 0-10 (A) None seen /hpf Final   Bacteria, UA Few None seen/Few Final    Coagulation Studies: No results for input(s): LABPROT, INR in the last 72 hours.  Urinalysis: No results for input(s): COLORURINE, LABSPEC, PHURINE, GLUCOSEU, HGBUR, BILIRUBINUR, KETONESUR, PROTEINUR, UROBILINOGEN, NITRITE, LEUKOCYTESUR in the last 168 hours.  Invalid input(s): APPERANCEUR  Lipid Panel:     Component Value Date/Time   CHOL 219 (H) 06/30/2017 0700   CHOL 260 (H) 02/17/2017 0910   TRIG 196 (H) 06/30/2017 0700   HDL 44 06/30/2017 0700   HDL 47 02/17/2017 0910   CHOLHDL 5.0 06/30/2017 0700   VLDL 39 06/30/2017 0700   LDLCALC 136 (H) 06/30/2017 0700   LDLCALC 153 (H) 02/17/2017 0910    HgbA1C:  Lab Results  Component Value Date   HGBA1C 6.0 (H) 06/30/2017    Urine Drug Screen:  No results found for: LABOPIA, COCAINSCRNUR, LABBENZ, AMPHETMU, THCU, LABBARB  Alcohol Level: No results for input(s): ETH in the last 168 hours.  Other results: EKG: junctional rhythm at 67 bpm.  Imaging: Ct Angio Head W Or Wo Contrast  Result Date: 06/30/2017 CLINICAL DATA:  Vertigo EXAM: CT ANGIOGRAPHY HEAD AND NECK TECHNIQUE: Multidetector CT imaging of the head and neck was performed using the standard protocol during bolus administration of intravenous contrast. Multiplanar CT image reconstructions and MIPs were obtained to evaluate the vascular anatomy. Carotid stenosis measurements (when applicable) are obtained utilizing NASCET criteria, using the distal internal carotid diameter as the denominator. CONTRAST:  154mL ISOVUE-370 IOPAMIDOL (ISOVUE-370) INJECTION 76% COMPARISON:  Head CT 14 18 FINDINGS: CTA NECK FINDINGS AORTIC ARCH: There is mild calcific atherosclerosis of the aortic  arch. There is no aneurysm, dissection or hemodynamically significant stenosis of the visualized ascending aorta and aortic arch. Conventional 3 vessel aortic branching pattern. The visualized proximal subclavian arteries are widely patent. RIGHT CAROTID SYSTEM: --Common carotid artery: Widely patent origin without common carotid artery dissection or aneurysm. --Internal carotid artery: No dissection, occlusion or aneurysm. Mild atherosclerotic calcification at the carotid bifurcation without hemodynamically significant stenosis. --External carotid artery: No acute abnormality. LEFT CAROTID SYSTEM: --Common carotid artery:  Widely patent origin without common carotid artery dissection or aneurysm. --Internal carotid artery:No dissection, occlusion or aneurysm. No hemodynamically significant stenosis. --External carotid artery: No acute abnormality. VERTEBRAL ARTERIES: Codominant configuration. Severe stenosis at the origin of the right vertebral artery. Mild left vertebral artery origin stenosis. No dissection, occlusion or flow-limiting stenosis to the vertebrobasilar confluence. SKELETON: There is no bony spinal canal stenosis. No lytic or blastic lesion. OTHER NECK: Normal pharynx, larynx and major salivary glands. No cervical lymphadenopathy. Unremarkable thyroid gland. UPPER CHEST: No pneumothorax or pleural effusion. No nodules or masses. CTA HEAD FINDINGS ANTERIOR CIRCULATION: --Intracranial internal carotid arteries: Normal. --Anterior cerebral arteries: Normal. Both A1 segments are present. Patent anterior communicating artery. --Middle cerebral arteries: Normal. --Posterior communicating arteries: Absent bilaterally. POSTERIOR CIRCULATION: --Basilar artery: Normal. --Posterior cerebral arteries: Normal. --Superior cerebellar arteries: Normal. --Inferior cerebellar arteries: Normal anterior and posterior inferior cerebellar arteries. VENOUS SINUSES: As permitted by contrast timing, patent. ANATOMIC VARIANTS:  None DELAYED PHASE: No parenchymal contrast enhancement. Review of the MIP images confirms the above findings. IMPRESSION: 1. No aneurysm, occlusion or hemodynamically significant stenosis of the intracranial arteries. 2. Severe right and mild left vertebral artery origin stenoses. Otherwise, the vertebral arteries are normal. Electronically Signed   By: Ulyses Jarred M.D.   On: 06/30/2017 02:23   Ct Head Wo Contrast  Result Date: 06/29/2017 CLINICAL DATA:  Vertigo EXAM: CT HEAD WITHOUT CONTRAST TECHNIQUE: Contiguous axial images were obtained from the base of the skull through the vertex without intravenous contrast. COMPARISON:  None. FINDINGS: Brain: No mass lesion, intraparenchymal hemorrhage or extra-axial collection. No evidence of acute cortical infarct. Normal appearance of the brain parenchyma and extra axial spaces for age. Vascular: No hyperdense vessel or unexpected vascular calcification. Skull: Normal visualized skull base, calvarium and extracranial soft tissues. Sinuses/Orbits: No sinus fluid levels or advanced mucosal thickening. No mastoid effusion. Normal orbits. IMPRESSION: Normal aging brain. Electronically Signed   By: Ulyses Jarred M.D.   On: 06/29/2017 22:12   Ct Angio Neck W And/or Wo Contrast  Result Date: 06/30/2017 CLINICAL DATA:  Vertigo EXAM: CT ANGIOGRAPHY HEAD AND NECK TECHNIQUE: Multidetector CT imaging of the head and neck was performed using the standard protocol during bolus administration of intravenous contrast. Multiplanar CT image reconstructions and MIPs were obtained to evaluate the vascular anatomy. Carotid stenosis measurements (when applicable) are obtained utilizing NASCET criteria, using the distal internal carotid diameter as the denominator. CONTRAST:  157mL ISOVUE-370 IOPAMIDOL (ISOVUE-370) INJECTION 76% COMPARISON:  Head CT 14 18 FINDINGS: CTA NECK FINDINGS AORTIC ARCH: There is mild calcific atherosclerosis of the aortic arch. There is no aneurysm, dissection  or hemodynamically significant stenosis of the visualized ascending aorta and aortic arch. Conventional 3 vessel aortic branching pattern. The visualized proximal subclavian arteries are widely patent. RIGHT CAROTID SYSTEM: --Common carotid artery: Widely patent origin without common carotid artery dissection or aneurysm. --Internal carotid artery: No dissection, occlusion or aneurysm. Mild atherosclerotic calcification at the carotid bifurcation without hemodynamically significant stenosis. --External carotid artery: No acute abnormality. LEFT CAROTID SYSTEM: --Common carotid artery: Widely patent origin without common carotid artery dissection or aneurysm. --Internal carotid artery:No dissection, occlusion or aneurysm. No hemodynamically significant stenosis. --External carotid artery: No acute abnormality. VERTEBRAL ARTERIES: Codominant configuration. Severe stenosis at the origin of the right vertebral artery. Mild left vertebral artery origin stenosis. No dissection, occlusion or flow-limiting stenosis to the vertebrobasilar confluence. SKELETON: There is no bony spinal canal stenosis. No lytic or blastic lesion. OTHER NECK: Normal pharynx, larynx and major  salivary glands. No cervical lymphadenopathy. Unremarkable thyroid gland. UPPER CHEST: No pneumothorax or pleural effusion. No nodules or masses. CTA HEAD FINDINGS ANTERIOR CIRCULATION: --Intracranial internal carotid arteries: Normal. --Anterior cerebral arteries: Normal. Both A1 segments are present. Patent anterior communicating artery. --Middle cerebral arteries: Normal. --Posterior communicating arteries: Absent bilaterally. POSTERIOR CIRCULATION: --Basilar artery: Normal. --Posterior cerebral arteries: Normal. --Superior cerebellar arteries: Normal. --Inferior cerebellar arteries: Normal anterior and posterior inferior cerebellar arteries. VENOUS SINUSES: As permitted by contrast timing, patent. ANATOMIC VARIANTS: None DELAYED PHASE: No parenchymal  contrast enhancement. Review of the MIP images confirms the above findings. IMPRESSION: 1. No aneurysm, occlusion or hemodynamically significant stenosis of the intracranial arteries. 2. Severe right and mild left vertebral artery origin stenoses. Otherwise, the vertebral arteries are normal. Electronically Signed   By: Ulyses Jarred M.D.   On: 06/30/2017 02:23   Mr Brain Wo Contrast  Result Date: 06/30/2017 CLINICAL DATA:  Persistent dizziness with positional exacerbate shin. Vertebral artery stenosis shown by CT angiography. EXAM: MRI HEAD WITHOUT CONTRAST TECHNIQUE: Multiplanar, multiecho pulse sequences of the brain and surrounding structures were obtained without intravenous contrast. COMPARISON:  CT studies yesterday and today. FINDINGS: Brain: Brain has normal appearance without evidence of malformation, unexpected atrophy, old or acute small or large vessel infarction, mass lesion, hemorrhage, hydrocephalus or extra-axial collection. Mild age related generalized volume loss. Vascular: Major vessels at the base of the brain show flow. Venous sinuses appear patent. Skull and upper cervical spine: Normal. Sinuses/Orbits: Clear/normal.  No fluid in the mastoids. Other: None significant. IMPRESSION: Mild generalized age typical volume loss. No sign of old or acute small or large vessel infarction. Electronically Signed   By: Nelson Chimes M.D.   On: 06/30/2017 10:28     Assessment/Plan: 84 year old female presenting with acute onset of dizziness.  Lab work significant for hypokalemia.  MRI of the brain reviewed and shows no acute changes.  CTA performed and shows severe right and mild left vertebral stenosis.  Patient on ASA at home.   Doubt dizziness due to vertebral stenosis.  Symptoms improved today.  Very positional in character.    Recommendations: 1.  Continue Meclizine prn 2.  Continue ASA 3.  TSH  Alexis Goodell, MD Neurology [phone] 06/30/2017, 12:41 PM

## 2017-06-30 NOTE — Progress Notes (Signed)
PT Cancellation Note  Patient Details Name: Deborah Gibson MRN: [id number] DOB: [birth year]   Cancelled Treatment:    Reason Eval/Treat Not Completed: Other (comment). Consult received and chart reviewed. Per review, pt with severe R side stenosis in vertebral artery with pending neuro consult. MRI negative for acute CVA. Pt also with low K+ at 2.8. Will hold at this time to review further POC to determine if acute intervention will be needed. Will follow for further needs.   Kyrin Gratz 06/30/2017, 11:00 AM  Greggory Stallion, PT, DPT [phone]

## 2017-06-30 NOTE — H&P (Signed)
[facility] NAME: Deborah Gibson    MR#:  [id number]  DATE OF BIRTH:  June 11, 1931  DATE OF ADMISSION:  06/29/2017  PRIMARY CARE PHYSICIAN: Guadalupe Maple, MD   REQUESTING/REFERRING PHYSICIAN: Dahlia Client, MD  CHIEF COMPLAINT:   Chief Complaint  Patient presents with  . Dizziness  . Emesis    HISTORY OF PRESENT ILLNESS:  Deborah Gibson  is a 84 y.o. female who presents with persistent dizziness.  Patient describes her dizziness with positional exacerbation.  However, despite treatment in the ED for vertigo or dizziness is persistent.  CT head was initially unremarkable, but given her persistent symptoms ED physician ordered CTA head and neck.  This showed significant vertebral artery stenosis.  Hospitalist were called for admission for further evaluation.  PAST MEDICAL HISTORY:   Past Medical History:  Diagnosis Date  . Arthritis   . Cancer ([place])    skin ca  . GERD (gastroesophageal reflux disease)   . Hyperlipidemia   . Hypertension   . Seasonal allergies      PAST SURGICAL HISTORY:   Past Surgical History:  Procedure Laterality Date  . ABDOMINAL HYSTERECTOMY    . BLADDER SUSPENSION    . [place], [place]  . CATARACT EXTRACTION    . CORONARY ANGIOPLASTY  2016  . CYSTOCELE REPAIR    . ESOPHAGOGASTRODUODENOSCOPY (EGD) WITH PROPOFOL N/A 03/12/2016   Procedure: ESOPHAGOGASTRODUODENOSCOPY (EGD) WITH PROPOFOL;  Surgeon: Jonathon Bellows, MD;  Location: [facility] ENDOSCOPY;  Service: Endoscopy;  Laterality: N/A;  . EYE SURGERY     cataract surgery  . rectal vaginal fistula repair    . TONSILLECTOMY    . VAGINAL HYSTERECTOMY       SOCIAL HISTORY:   Social History   Tobacco Use  . Smoking status: Former Smoker    Packs/day: 3.00    Years: 23.00    Pack years: 69.00    Types: Cigarettes    Last attempt to quit: 04/15/1978    Years since quitting: 39.2  . Smokeless tobacco: Never Used   Substance Use Topics  . Alcohol use: No    Alcohol/week: 0.0 oz     FAMILY HISTORY:   Family History  Problem Relation Age of Onset  . Hypertension Mother   . Heart failure Mother   . Hyperlipidemia Mother   . Stroke Mother   . Cancer Sister   . Heart disease Sister   . Hypertension Sister   . Heart disease Father   . Diabetes Brother   . Heart disease Brother        pacer/defiib  . Hypertension Brother   . Heart attack Brother   . Heart disease Brother        CABG  . Leukemia Daughter      DRUG ALLERGIES:   Allergies  Allergen Reactions  . Scopolamine Anaphylaxis  . Amlodipine   . Amlodipine Swelling  . Benazepril Cough  . Codeine     Sick on stomach   . Crestor [Rosuvastatin] Other (See Comments)    myalgias  . Lovastatin Other (See Comments)    Myalgias   . Scopolamine   . Statins     Legs ache   . Zocor [Simvastatin] Other (See Comments)    myalgias    MEDICATIONS AT HOME:   Prior to Admission medications   Medication Sig Start Date End Date Taking? Authorizing Provider  aspirin EC 81 MG tablet Take  81 mg by mouth daily.    [provider]  carvedilol (COREG) 25 MG tablet Take 1 tablet (25 mg total) by mouth 2 (two) times daily with a meal. 02/17/17   Crissman, Jeannette How, MD  cetirizine (ZYRTEC) 10 MG tablet Take 10 mg by mouth daily.    [provider]  Coenzyme Q10 (COQ10) 200 MG CAPS Take by mouth.    [provider]  fluticasone (FLONASE) 50 MCG/ACT nasal spray Place 2 sprays into both nostrils daily as needed.     [provider]  hydrochlorothiazide (HYDRODIURIL) 25 MG tablet Take 1 tablet (25 mg total) by mouth daily. 02/17/17   Guadalupe Maple, MD  meclizine (ANTIVERT) 25 MG tablet Take 1 tablet (25 mg total) by mouth 3 (three) times daily as needed for dizziness. 06/29/17   Orbie Pyo, MD  pantoprazole (PROTONIX) 40 MG tablet Take 40 mg by mouth daily.    [provider]  vitamin  B-12 (CYANOCOBALAMIN) 500 MCG tablet Take 500 mcg by mouth daily.    [provider]    REVIEW OF SYSTEMS:  Review of Systems  Constitutional: Negative for chills, fever, malaise/fatigue and weight loss.  HENT: Negative for ear pain, hearing loss and tinnitus.   Eyes: Negative for blurred vision, double vision, pain and redness.  Respiratory: Negative for cough, hemoptysis and shortness of breath.   Cardiovascular: Negative for chest pain, palpitations, orthopnea and leg swelling.  Gastrointestinal: Negative for abdominal pain, constipation, diarrhea, nausea and vomiting.  Genitourinary: Negative for dysuria, frequency and hematuria.  Musculoskeletal: Negative for back pain, joint pain and neck pain.  Skin:       No acne, rash, or lesions  Neurological: Positive for dizziness. Negative for tremors, focal weakness and weakness.  Endo/Heme/Allergies: Negative for polydipsia. Does not bruise/bleed easily.  Psychiatric/Behavioral: Negative for depression. The patient is not nervous/anxious and does not have insomnia.      VITAL SIGNS:   Vitals:   06/30/17 0030 06/30/17 0225 06/30/17 0230 06/30/17 0300  BP: (!) 147/64 (!) 176/67 (!) 168/61 137/86  Pulse: 78   74  Resp: 16 (!) 23 20 16   Temp:      TempSrc:      SpO2: 97%   95%  Weight:      Height:       Wt Readings from Last 3 Encounters:  06/29/17 81.2 kg (179 lb)  02/17/17 82.1 kg (181 lb)  10/14/16 81.1 kg (178 lb 12.8 oz)    PHYSICAL EXAMINATION:  Physical Exam  Vitals reviewed. Constitutional: She is oriented to person, place, and time. She appears well-developed and well-nourished. No distress.  HENT:  Head: Normocephalic and atraumatic.  Mouth/Throat: Oropharynx is clear and moist.  Eyes: Pupils are equal, round, and reactive to light. Conjunctivae and EOM are normal. No scleral icterus.  Neck: Normal range of motion. Neck supple. No JVD present. No thyromegaly present.  Cardiovascular: Normal rate, regular  rhythm and intact distal pulses. Exam reveals no gallop and no friction rub.  No murmur heard. Respiratory: Effort normal and breath sounds normal. No respiratory distress. She has no wheezes. She has no rales.  GI: Soft. Bowel sounds are normal. She exhibits no distension. There is no tenderness.  Musculoskeletal: Normal range of motion. She exhibits no edema.  No arthritis, no gout  Lymphadenopathy:    She has no cervical adenopathy.  Neurological: She is alert and oriented to person, place, and time. No cranial nerve deficit.  No  acute focal deficit, no nystagmus.  Skin: Skin is warm and dry. No rash noted. No erythema.  Psychiatric: She has a normal mood and affect. Her behavior is normal. Judgment and thought content normal.    LABORATORY PANEL:   CBC Recent Labs  Lab 06/29/17 2143  WBC 9.8  HGB 13.8  HCT 40.1  PLT 220   ------------------------------------------------------------------------------------------------------------------  Chemistries  Recent Labs  Lab 06/29/17 2143  NA 142  K 2.8*  CL 103  CO2 28  GLUCOSE 136*  BUN 20  CREATININE 0.82  CALCIUM 9.4   ------------------------------------------------------------------------------------------------------------------  Cardiac Enzymes Recent Labs  Lab 06/29/17 2143  TROPONINI <0.03   ------------------------------------------------------------------------------------------------------------------  RADIOLOGY:  Ct Angio Head W Or Wo Contrast  Result Date: 06/30/2017 CLINICAL DATA:  Vertigo EXAM: CT ANGIOGRAPHY HEAD AND NECK TECHNIQUE: Multidetector CT imaging of the head and neck was performed using the standard protocol during bolus administration of intravenous contrast. Multiplanar CT image reconstructions and MIPs were obtained to evaluate the vascular anatomy. Carotid stenosis measurements (when applicable) are obtained utilizing NASCET criteria, using the distal internal carotid diameter as the  denominator. CONTRAST:  194mL ISOVUE-370 IOPAMIDOL (ISOVUE-370) INJECTION 76% COMPARISON:  Head CT 14 18 FINDINGS: CTA NECK FINDINGS AORTIC ARCH: There is mild calcific atherosclerosis of the aortic arch. There is no aneurysm, dissection or hemodynamically significant stenosis of the visualized ascending aorta and aortic arch. Conventional 3 vessel aortic branching pattern. The visualized proximal subclavian arteries are widely patent. RIGHT CAROTID SYSTEM: --Common carotid artery: Widely patent origin without common carotid artery dissection or aneurysm. --Internal carotid artery: No dissection, occlusion or aneurysm. Mild atherosclerotic calcification at the carotid bifurcation without hemodynamically significant stenosis. --External carotid artery: No acute abnormality. LEFT CAROTID SYSTEM: --Common carotid artery: Widely patent origin without common carotid artery dissection or aneurysm. --Internal carotid artery:No dissection, occlusion or aneurysm. No hemodynamically significant stenosis. --External carotid artery: No acute abnormality. VERTEBRAL ARTERIES: Codominant configuration. Severe stenosis at the origin of the right vertebral artery. Mild left vertebral artery origin stenosis. No dissection, occlusion or flow-limiting stenosis to the vertebrobasilar confluence. SKELETON: There is no bony spinal canal stenosis. No lytic or blastic lesion. OTHER NECK: Normal pharynx, larynx and major salivary glands. No cervical lymphadenopathy. Unremarkable thyroid gland. UPPER CHEST: No pneumothorax or pleural effusion. No nodules or masses. CTA HEAD FINDINGS ANTERIOR CIRCULATION: --Intracranial internal carotid arteries: Normal. --Anterior cerebral arteries: Normal. Both A1 segments are present. Patent anterior communicating artery. --Middle cerebral arteries: Normal. --Posterior communicating arteries: Absent bilaterally. POSTERIOR CIRCULATION: --Basilar artery: Normal. --Posterior cerebral arteries: Normal.  --Superior cerebellar arteries: Normal. --Inferior cerebellar arteries: Normal anterior and posterior inferior cerebellar arteries. VENOUS SINUSES: As permitted by contrast timing, patent. ANATOMIC VARIANTS: None DELAYED PHASE: No parenchymal contrast enhancement. Review of the MIP images confirms the above findings. IMPRESSION: 1. No aneurysm, occlusion or hemodynamically significant stenosis of the intracranial arteries. 2. Severe right and mild left vertebral artery origin stenoses. Otherwise, the vertebral arteries are normal. Electronically Signed   By: Ulyses Jarred M.D.   On: 06/30/2017 02:23   Ct Head Wo Contrast  Result Date: 06/29/2017 CLINICAL DATA:  Vertigo EXAM: CT HEAD WITHOUT CONTRAST TECHNIQUE: Contiguous axial images were obtained from the base of the skull through the vertex without intravenous contrast. COMPARISON:  None. FINDINGS: Brain: No mass lesion, intraparenchymal hemorrhage or extra-axial collection. No evidence of acute cortical infarct. Normal appearance of the brain parenchyma and extra axial spaces for age. Vascular: No hyperdense vessel or unexpected vascular calcification. Skull: Normal visualized  skull base, calvarium and extracranial soft tissues. Sinuses/Orbits: No sinus fluid levels or advanced mucosal thickening. No mastoid effusion. Normal orbits. IMPRESSION: Normal aging brain. Electronically Signed   By: Ulyses Jarred M.D.   On: 06/29/2017 22:12   Ct Angio Neck W And/or Wo Contrast  Result Date: 06/30/2017 CLINICAL DATA:  Vertigo EXAM: CT ANGIOGRAPHY HEAD AND NECK TECHNIQUE: Multidetector CT imaging of the head and neck was performed using the standard protocol during bolus administration of intravenous contrast. Multiplanar CT image reconstructions and MIPs were obtained to evaluate the vascular anatomy. Carotid stenosis measurements (when applicable) are obtained utilizing NASCET criteria, using the distal internal carotid diameter as the denominator. CONTRAST:  138mL  ISOVUE-370 IOPAMIDOL (ISOVUE-370) INJECTION 76% COMPARISON:  Head CT 14 18 FINDINGS: CTA NECK FINDINGS AORTIC ARCH: There is mild calcific atherosclerosis of the aortic arch. There is no aneurysm, dissection or hemodynamically significant stenosis of the visualized ascending aorta and aortic arch. Conventional 3 vessel aortic branching pattern. The visualized proximal subclavian arteries are widely patent. RIGHT CAROTID SYSTEM: --Common carotid artery: Widely patent origin without common carotid artery dissection or aneurysm. --Internal carotid artery: No dissection, occlusion or aneurysm. Mild atherosclerotic calcification at the carotid bifurcation without hemodynamically significant stenosis. --External carotid artery: No acute abnormality. LEFT CAROTID SYSTEM: --Common carotid artery: Widely patent origin without common carotid artery dissection or aneurysm. --Internal carotid artery:No dissection, occlusion or aneurysm. No hemodynamically significant stenosis. --External carotid artery: No acute abnormality. VERTEBRAL ARTERIES: Codominant configuration. Severe stenosis at the origin of the right vertebral artery. Mild left vertebral artery origin stenosis. No dissection, occlusion or flow-limiting stenosis to the vertebrobasilar confluence. SKELETON: There is no bony spinal canal stenosis. No lytic or blastic lesion. OTHER NECK: Normal pharynx, larynx and major salivary glands. No cervical lymphadenopathy. Unremarkable thyroid gland. UPPER CHEST: No pneumothorax or pleural effusion. No nodules or masses. CTA HEAD FINDINGS ANTERIOR CIRCULATION: --Intracranial internal carotid arteries: Normal. --Anterior cerebral arteries: Normal. Both A1 segments are present. Patent anterior communicating artery. --Middle cerebral arteries: Normal. --Posterior communicating arteries: Absent bilaterally. POSTERIOR CIRCULATION: --Basilar artery: Normal. --Posterior cerebral arteries: Normal. --Superior cerebellar arteries:  Normal. --Inferior cerebellar arteries: Normal anterior and posterior inferior cerebellar arteries. VENOUS SINUSES: As permitted by contrast timing, patent. ANATOMIC VARIANTS: None DELAYED PHASE: No parenchymal contrast enhancement. Review of the MIP images confirms the above findings. IMPRESSION: 1. No aneurysm, occlusion or hemodynamically significant stenosis of the intracranial arteries. 2. Severe right and mild left vertebral artery origin stenoses. Otherwise, the vertebral arteries are normal. Electronically Signed   By: Ulyses Jarred M.D.   On: 06/30/2017 02:23    EKG:   Orders placed or performed during the hospital encounter of 06/29/17  . EKG 12-Lead  . EKG 12-Lead    IMPRESSION AND PLAN:  Principal Problem:   Dizziness -unclear etiology, CTA neck showed vertebral artery stenosis raising concern for possible stroke versus vertebrobasilar insufficiency as possible cause of her symptoms.  See workup below Active Problems:   Vertebral artery stenosis -will admit per stroke admission order set with MRI to evaluate for the possibility of any posterior circulation stroke, as well as appropriate labs and neurology consult   Essential hypertension -currently normotensive, hold antihypertensives for now until it is clear whether or not she has had a stroke   Coronary artery disease -continue home meds   Hyperlipidemia -home dose antilipid   Gastroesophageal reflux disease without esophagitis -home dose PPI  Chart review performed and case discussed with ED provider. Labs, imaging and/or ECG  reviewed by provider and discussed with patient/family. Management plans discussed with the patient and/or family.  DVT PROPHYLAXIS: SubQ lovenox  GI PROPHYLAXIS: PPI  ADMISSION STATUS: Observation  CODE STATUS: Full  TOTAL TIME TAKING CARE OF THIS PATIENT: 40 minutes.   Deborah Gibson FIELDING 06/30/2017, 3:28 AM  [facility]  Office  [phone]  CC: Primary care  physician; Guadalupe Maple, MD  Note:  This document was prepared using Dragon voice recognition software and may include unintentional dictation errors.

## 2017-07-01 DIAGNOSIS — R42 Dizziness and giddiness: Secondary | ICD-10-CM | POA: Diagnosis not present

## 2017-07-01 DIAGNOSIS — I10 Essential (primary) hypertension: Secondary | ICD-10-CM | POA: Diagnosis not present

## 2017-07-01 DIAGNOSIS — E785 Hyperlipidemia, unspecified: Secondary | ICD-10-CM | POA: Diagnosis not present

## 2017-07-01 DIAGNOSIS — I6509 Occlusion and stenosis of unspecified vertebral artery: Secondary | ICD-10-CM | POA: Diagnosis not present

## 2017-07-01 LAB — TSH: TSH: 3.503 u[IU]/mL (ref 0.350–4.500)

## 2017-07-01 LAB — BASIC METABOLIC PANEL
ANION GAP: 7 (ref 5–15)
BUN: 16 mg/dL (ref 6–20)
CO2: 29 mmol/L (ref 22–32)
Calcium: 9 mg/dL (ref 8.9–10.3)
Chloride: 104 mmol/L (ref 101–111)
Creatinine, Ser: 0.75 mg/dL (ref 0.44–1.00)
Glucose, Bld: 105 mg/dL — ABNORMAL HIGH (ref 65–99)
POTASSIUM: 3.6 mmol/L (ref 3.5–5.1)
SODIUM: 140 mmol/L (ref 135–145)

## 2017-07-01 MED ORDER — MECLIZINE HCL 25 MG PO TABS: 25.0000 mg | ORAL_TABLET | Freq: Three times a day (TID) | ORAL | 0 refills | Status: DC | PRN

## 2017-07-01 NOTE — Progress Notes (Signed)
Received MD order to discharge patient to home, reviewed home meds discharge instructions and follow up appointments  with patient and patient verbalized  understanding  

## 2017-07-01 NOTE — Evaluation (Signed)
Physical Therapy Evaluation Patient Details Name: Deborah Gibson MRN: [id number] DOB: 1931/09/30 Today's Date: 07/01/2017   History of Present Illness  Pt is an 84 year old female presenting with acute onset of dizziness. Lab work was significant for hypokalemia. MRI of the brain reviewed and shows no acute changes. CTA performed and shows severe right and mild left vertebral stenosis. Patient on ASA at home.  Symptoms now improved.     Clinical Impression  Pt Ind with all functional mobility including transfers, amb, and ascending and descending steps.  Pt steady during higher level tasks including sharp turns during amb without AD and SLS without UE support.  Pt reported no onset of dizziness or other adverse symptoms during assessment.  Pt's SpO2 and HR WNL during session.  Pt reports feeling back to baseline at this time and is not appropriate for continued skilled PT services.  Will complete orders but will reassess pt pending a change in status upon receipt of new PT orders.    Follow Up Recommendations No PT follow up    Equipment Recommendations  None recommended by PT    Recommendations for Other Services       Precautions / Restrictions Precautions Precautions: Fall Restrictions Weight Bearing Restrictions: No      Mobility  Bed Mobility Overal bed mobility: Independent                Transfers Overall transfer level: Independent               General transfer comment: Good stability and eccentric and concentric control  Ambulation/Gait Ambulation/Gait assistance: Independent Ambulation Distance (Feet): 200 Feet Assistive device: None Gait Pattern/deviations: WFL(Within Functional Limits)   Gait velocity interpretation: at or above normal speed for age/gender General Gait Details: Excellent cadence with good stability including during start/stops and sharp turns  Stairs Stairs: Yes Stairs assistance: Modified independent (Device/Increase  time) Stair Management: One rail Left Number of Stairs: 6 General stair comments: Reciprocal pattern with good control and stability  Wheelchair Mobility    Modified Rankin (Stroke Patients Only)       Balance Overall balance assessment: Independent                                           Pertinent Vitals/Pain Pain Assessment: No/denies pain    Home Living Family/patient expects to be discharged to:: Private residence Living Arrangements: Alone Available Help at Discharge: Family;Friend(s);Available PRN/intermittently Type of Home: House Home Access: Stairs to enter Entrance Stairs-Rails: Right;Left;Can reach both(Posts) Entrance Stairs-Number of Steps: 3 Home Layout: One level Home Equipment: Cane - single point      Prior Function Level of Independence: Independent         Comments: Pt Ind with amb community distances without AD with no fall history, bowls 2-3x/wk, Ind with ADLs     Hand Dominance        Extremity/Trunk Assessment   Upper Extremity Assessment Upper Extremity Assessment: Overall WFL for tasks assessed    Lower Extremity Assessment Lower Extremity Assessment: Overall WFL for tasks assessed       Communication   Communication: No difficulties  Cognition Arousal/Alertness: Awake/alert Behavior During Therapy: WFL for tasks assessed/performed Overall Cognitive Status: Within Functional Limits for tasks assessed  General Comments General comments (skin integrity, edema, etc.): Pt able to perform SLS > 10 sec to BLEs    Exercises     Assessment/Plan    PT Assessment Patent does not need any further PT services  PT Problem List         PT Treatment Interventions      PT Goals (Current goals can be found in the Care Plan section)  Acute Rehab PT Goals PT Goal Formulation: All assessment and education complete, DC therapy    Frequency     Barriers to  discharge        Co-evaluation               AM-PAC PT "6 Clicks" Daily Activity  Outcome Measure Difficulty turning over in bed (including adjusting bedclothes, sheets and blankets)?: None Difficulty moving from lying on back to sitting on the side of the bed? : None Difficulty sitting down on and standing up from a chair with arms (e.g., wheelchair, bedside commode, etc,.)?: None Help needed moving to and from a bed to chair (including a wheelchair)?: None Help needed walking in hospital room?: None Help needed climbing 3-5 steps with a railing? : None 6 Click Score: 24    End of Session Equipment Utilized During Treatment: Gait belt Activity Tolerance: Patient tolerated treatment well Patient left: in chair Nurse Communication: Mobility status PT Visit Diagnosis: Dizziness and giddiness (R42)    Time: 7356-7014 PT Time Calculation (min) (ACUTE ONLY): 21 min   Charges:   PT Evaluation $PT Eval Low Complexity: 1 Low     PT G Codes:        DRoyetta Asal PT, DPT 07/01/17, 10:44 AM

## 2017-07-01 NOTE — Discharge Summary (Signed)
Deborah Gibson, is a 84 y.o. female  DOB [birth year]  MRN [id number].  Admission date:  06/29/2017  Admitting Physician  Lance Coon, MD  Discharge Date:  07/01/2017   Primary MD  Guadalupe Maple, MD  Recommendations for primary care physician for things to follow:   Follow with PCP in 1 week   Admission Diagnosis  Dizziness [R42] Vertigo [R42] Vertebrobasilar insufficiency [G45.0]   Discharge Diagnosis  Dizziness [R42] Vertigo [R42] Vertebrobasilar insufficiency [G45.0]    Principal Problem:   Dizziness Active Problems:   Hyperlipidemia   Essential hypertension   Gastroesophageal reflux disease without esophagitis   Coronary artery disease   Vertebral artery stenosis      Past Medical History:  Diagnosis Date  . Arthritis   . Cancer ([place])    skin ca  . GERD (gastroesophageal reflux disease)   . Hyperlipidemia   . Hypertension   . Seasonal allergies     Past Surgical History:  Procedure Laterality Date  . ABDOMINAL HYSTERECTOMY    . BLADDER SUSPENSION    . [place], [place]  . CATARACT EXTRACTION    . CORONARY ANGIOPLASTY  2016  . CYSTOCELE REPAIR    . ESOPHAGOGASTRODUODENOSCOPY (EGD) WITH PROPOFOL N/A 03/12/2016   Procedure: ESOPHAGOGASTRODUODENOSCOPY (EGD) WITH PROPOFOL;  Surgeon: Jonathon Bellows, MD;  Location: [facility] ENDOSCOPY;  Service: Endoscopy;  Laterality: N/A;  . EYE SURGERY     cataract surgery  . rectal vaginal fistula repair    . TONSILLECTOMY    . VAGINAL HYSTERECTOMY         History of present illness and  Hospital Course:     Kindly see H&P for history of present illness and admission details, please review complete Labs, Consult reports and Test reports for all details in brief  HPI  from the history and physical done on the day of  admission 84 year old female patient with history of hypertension admitted for dizziness, admitted for evaluation of stroke.   Hospital Course  #1 benign positional vertigo: Patient MRI of the brain did not show acute stroke initially admitted for evaluation of TIA/posterior circulation stroke  Because of her dizziness.  Patient MRI of the brain , not show acute stroke.  Patient also had a CTA of head and neck which showed no aneurysm or hemodynamically significant stenosis intracranially, patient noted to have severe right, left vertebral artery stenosis, patient continued on aspirin, started on meclizine for dizziness.  Echocardiogram showed EF more than 50% without any wall motion abnormality.  Did not find any arrhythmias on the monitor.  Patient dizziness improved with meclizine, physical therapy did not recommend any home health physical therapy.  She feels much better and wants to go home.  And patient will be going home with aspirin alone.  Patient cannot tolerate statins, she told me that she was on different types of statins could not tolerate them because of severe muscle cramps advised the patient to see if she needs to be on Praluent.Marland Kitchen  LDL 136 today.  Has known history of hyperlipidemia.  Essential hypertension: Controlled 3.  Hypokalemia secondary to nausea, vomiting, improved with replacement, potassium 2.8 and improved to 3.6 today.   Discharge Condition:  stable   Follow UP  Follow-up Information    Crissman, Jeannette How, MD. Schedule an appointment as soon as possible for a visit in 1 week.   Specialty:  Family Medicine Contact information: [place] [address] [phone]  Discharge Instructions  and  Discharge Medications      Allergies as of 07/01/2017      Reactions   Scopolamine Anaphylaxis   Amlodipine    Amlodipine Swelling   Benazepril Cough   Codeine    Sick on stomach   Crestor [rosuvastatin] Other (See Comments)   myalgias    Lovastatin Other (See Comments)   Myalgias   Scopolamine    Statins    Legs ache   Zocor [simvastatin] Other (See Comments)   myalgias      Medication List    TAKE these medications   aspirin EC 81 MG tablet Take 81 mg by mouth daily.   carvedilol 25 MG tablet Commonly known as:  COREG Take 1 tablet (25 mg total) by mouth 2 (two) times daily with a meal.   cetirizine 10 MG tablet Commonly known as:  ZYRTEC Take 10 mg by mouth daily.   CoQ10 200 MG Caps Take 1 capsule by mouth daily.   fluticasone 50 MCG/ACT nasal spray Commonly known as:  FLONASE Place 2 sprays into both nostrils daily as needed.   hydrochlorothiazide 25 MG tablet Commonly known as:  HYDRODIURIL Take 1 tablet (25 mg total) by mouth daily.   meclizine 25 MG tablet Commonly known as:  ANTIVERT Take 1 tablet (25 mg total) by mouth 3 (three) times daily as needed for dizziness.   pantoprazole 40 MG tablet Commonly known as:  PROTONIX Take 40 mg by mouth daily.   vitamin B-12 500 MCG tablet Commonly known as:  CYANOCOBALAMIN Take 500 mcg by mouth daily.         Diet and Activity recommendation: See Discharge Instructions above   Consults obtained -Neurology   Major procedures and Radiology Reports - PLEASE review detailed and final reports for all details, in brief -     Ct Angio Head W Or Wo Contrast  Result Date: 06/30/2017 CLINICAL DATA:  Vertigo EXAM: CT ANGIOGRAPHY HEAD AND NECK TECHNIQUE: Multidetector CT imaging of the head and neck was performed using the standard protocol during bolus administration of intravenous contrast. Multiplanar CT image reconstructions and MIPs were obtained to evaluate the vascular anatomy. Carotid stenosis measurements (when applicable) are obtained utilizing NASCET criteria, using the distal internal carotid diameter as the denominator. CONTRAST:  175mL ISOVUE-370 IOPAMIDOL (ISOVUE-370) INJECTION 76% COMPARISON:  Head CT 14 18 FINDINGS: CTA NECK FINDINGS  AORTIC ARCH: There is mild calcific atherosclerosis of the aortic arch. There is no aneurysm, dissection or hemodynamically significant stenosis of the visualized ascending aorta and aortic arch. Conventional 3 vessel aortic branching pattern. The visualized proximal subclavian arteries are widely patent. RIGHT CAROTID SYSTEM: --Common carotid artery: Widely patent origin without common carotid artery dissection or aneurysm. --Internal carotid artery: No dissection, occlusion or aneurysm. Mild atherosclerotic calcification at the carotid bifurcation without hemodynamically significant stenosis. --External carotid artery: No acute abnormality. LEFT CAROTID SYSTEM: --Common carotid artery: Widely patent origin without common carotid artery dissection or aneurysm. --Internal carotid artery:No dissection, occlusion or aneurysm. No hemodynamically significant stenosis. --External carotid artery: No acute abnormality. VERTEBRAL ARTERIES: Codominant configuration. Severe stenosis at the origin of the right vertebral artery. Mild left vertebral artery origin stenosis. No dissection, occlusion or flow-limiting stenosis to the vertebrobasilar confluence. SKELETON: There is no bony spinal canal stenosis. No lytic or blastic lesion. OTHER NECK: Normal pharynx, larynx and major salivary glands. No cervical lymphadenopathy. Unremarkable thyroid gland. UPPER CHEST: No pneumothorax or pleural effusion. No nodules or masses. CTA HEAD FINDINGS ANTERIOR  CIRCULATION: --Intracranial internal carotid arteries: Normal. --Anterior cerebral arteries: Normal. Both A1 segments are present. Patent anterior communicating artery. --Middle cerebral arteries: Normal. --Posterior communicating arteries: Absent bilaterally. POSTERIOR CIRCULATION: --Basilar artery: Normal. --Posterior cerebral arteries: Normal. --Superior cerebellar arteries: Normal. --Inferior cerebellar arteries: Normal anterior and posterior inferior cerebellar arteries. VENOUS  SINUSES: As permitted by contrast timing, patent. ANATOMIC VARIANTS: None DELAYED PHASE: No parenchymal contrast enhancement. Review of the MIP images confirms the above findings. IMPRESSION: 1. No aneurysm, occlusion or hemodynamically significant stenosis of the intracranial arteries. 2. Severe right and mild left vertebral artery origin stenoses. Otherwise, the vertebral arteries are normal. Electronically Signed   By: Ulyses Jarred M.D.   On: 06/30/2017 02:23   Ct Head Wo Contrast  Result Date: 06/29/2017 CLINICAL DATA:  Vertigo EXAM: CT HEAD WITHOUT CONTRAST TECHNIQUE: Contiguous axial images were obtained from the base of the skull through the vertex without intravenous contrast. COMPARISON:  None. FINDINGS: Brain: No mass lesion, intraparenchymal hemorrhage or extra-axial collection. No evidence of acute cortical infarct. Normal appearance of the brain parenchyma and extra axial spaces for age. Vascular: No hyperdense vessel or unexpected vascular calcification. Skull: Normal visualized skull base, calvarium and extracranial soft tissues. Sinuses/Orbits: No sinus fluid levels or advanced mucosal thickening. No mastoid effusion. Normal orbits. IMPRESSION: Normal aging brain. Electronically Signed   By: Ulyses Jarred M.D.   On: 06/29/2017 22:12   Ct Angio Neck W And/or Wo Contrast  Result Date: 06/30/2017 CLINICAL DATA:  Vertigo EXAM: CT ANGIOGRAPHY HEAD AND NECK TECHNIQUE: Multidetector CT imaging of the head and neck was performed using the standard protocol during bolus administration of intravenous contrast. Multiplanar CT image reconstructions and MIPs were obtained to evaluate the vascular anatomy. Carotid stenosis measurements (when applicable) are obtained utilizing NASCET criteria, using the distal internal carotid diameter as the denominator. CONTRAST:  130mL ISOVUE-370 IOPAMIDOL (ISOVUE-370) INJECTION 76% COMPARISON:  Head CT 14 18 FINDINGS: CTA NECK FINDINGS AORTIC ARCH: There is mild calcific  atherosclerosis of the aortic arch. There is no aneurysm, dissection or hemodynamically significant stenosis of the visualized ascending aorta and aortic arch. Conventional 3 vessel aortic branching pattern. The visualized proximal subclavian arteries are widely patent. RIGHT CAROTID SYSTEM: --Common carotid artery: Widely patent origin without common carotid artery dissection or aneurysm. --Internal carotid artery: No dissection, occlusion or aneurysm. Mild atherosclerotic calcification at the carotid bifurcation without hemodynamically significant stenosis. --External carotid artery: No acute abnormality. LEFT CAROTID SYSTEM: --Common carotid artery: Widely patent origin without common carotid artery dissection or aneurysm. --Internal carotid artery:No dissection, occlusion or aneurysm. No hemodynamically significant stenosis. --External carotid artery: No acute abnormality. VERTEBRAL ARTERIES: Codominant configuration. Severe stenosis at the origin of the right vertebral artery. Mild left vertebral artery origin stenosis. No dissection, occlusion or flow-limiting stenosis to the vertebrobasilar confluence. SKELETON: There is no bony spinal canal stenosis. No lytic or blastic lesion. OTHER NECK: Normal pharynx, larynx and major salivary glands. No cervical lymphadenopathy. Unremarkable thyroid gland. UPPER CHEST: No pneumothorax or pleural effusion. No nodules or masses. CTA HEAD FINDINGS ANTERIOR CIRCULATION: --Intracranial internal carotid arteries: Normal. --Anterior cerebral arteries: Normal. Both A1 segments are present. Patent anterior communicating artery. --Middle cerebral arteries: Normal. --Posterior communicating arteries: Absent bilaterally. POSTERIOR CIRCULATION: --Basilar artery: Normal. --Posterior cerebral arteries: Normal. --Superior cerebellar arteries: Normal. --Inferior cerebellar arteries: Normal anterior and posterior inferior cerebellar arteries. VENOUS SINUSES: As permitted by contrast  timing, patent. ANATOMIC VARIANTS: None DELAYED PHASE: No parenchymal contrast enhancement. Review of the MIP images confirms the above  findings. IMPRESSION: 1. No aneurysm, occlusion or hemodynamically significant stenosis of the intracranial arteries. 2. Severe right and mild left vertebral artery origin stenoses. Otherwise, the vertebral arteries are normal. Electronically Signed   By: Ulyses Jarred M.D.   On: 06/30/2017 02:23   Mr Brain Wo Contrast  Result Date: 06/30/2017 CLINICAL DATA:  Persistent dizziness with positional exacerbate shin. Vertebral artery stenosis shown by CT angiography. EXAM: MRI HEAD WITHOUT CONTRAST TECHNIQUE: Multiplanar, multiecho pulse sequences of the brain and surrounding structures were obtained without intravenous contrast. COMPARISON:  CT studies yesterday and today. FINDINGS: Brain: Brain has normal appearance without evidence of malformation, unexpected atrophy, old or acute small or large vessel infarction, mass lesion, hemorrhage, hydrocephalus or extra-axial collection. Mild age related generalized volume loss. Vascular: Major vessels at the base of the brain show flow. Venous sinuses appear patent. Skull and upper cervical spine: Normal. Sinuses/Orbits: Clear/normal.  No fluid in the mastoids. Other: None significant. IMPRESSION: Mild generalized age typical volume loss. No sign of old or acute small or large vessel infarction. Electronically Signed   By: [place] Chimes M.D.   On: 06/30/2017 10:28    Micro Results    No results found for this or any previous visit (from the past 240 hour(s)).     Today   Subjective:   Deborah Gibson today has no headache,no chest abdominal pain,no new weakness tingling or numbness, feels much better wants to go home today.   Objective:   Blood pressure (!) 164/68, pulse 65, temperature 97.9 F (36.6 C), temperature source Oral, resp. rate 18, height 5\' 5"  (1.651 m), weight 81.2 kg (179 lb), SpO2 99 %.   Intake/Output  Summary (Last 24 hours) at 07/01/2017 1305 Last data filed at 06/30/2017 1700 Gross per 24 hour  Intake 320 ml  Output -  Net 320 ml    Exam Awake Alert, Oriented x 3, No new F.N deficits, Normal affect [place].AT,PERRAL Supple Neck,No JVD, No cervical lymphadenopathy appriciated.  Symmetrical Chest wall movement, Good air movement bilaterally, CTAB RRR,No Gallops,Rubs or new Murmurs, No Parasternal Heave +ve B.Sounds, Abd Soft, Non tender, No organomegaly appriciated, No rebound -guarding or rigidity. No Cyanosis, Clubbing or edema, No new Rash or bruise  Data Review   CBC w Diff:  Lab Results  Component Value Date   WBC 8.2 06/30/2017   HGB 13.4 06/30/2017   HGB 13.7 02/17/2017   HCT 38.5 06/30/2017   HCT 40.5 02/17/2017   PLT 198 06/30/2017   PLT 208 02/17/2017   LYMPHOPCT 23 06/29/2017   LYMPHOPCT 43.9 05/01/2014   MONOPCT 8 06/29/2017   MONOPCT 8.3 05/01/2014   EOSPCT 3 06/29/2017   EOSPCT 4.5 05/01/2014   BASOPCT 1 06/29/2017   BASOPCT 0.8 05/01/2014    CMP:  Lab Results  Component Value Date   NA 140 07/01/2017   NA 139 02/17/2017   NA 142 05/01/2014   K 3.6 07/01/2017   K 4.0 05/01/2014   CL 104 07/01/2017   CL 104 05/01/2014   CO2 29 07/01/2017   CO2 31 05/01/2014   BUN 16 07/01/2017   BUN 17 02/17/2017   BUN 16 05/01/2014   CREATININE 0.75 07/01/2017   CREATININE 0.87 05/01/2014   PROT 7.2 02/17/2017   ALBUMIN 4.5 02/17/2017   BILITOT 0.7 02/17/2017   ALKPHOS 94 02/17/2017   AST 27 02/17/2017   ALT 23 02/17/2017  .   Total Time in preparing paper work, data evaluation and todays exam - 35 minutes  Epifanio Lesches M.D on 07/01/2017 at 1:05 PM    Note: This dictation was prepared with Dragon dictation along with smaller phrase technology. Any transcriptional errors that result from this process are unintentional.

## 2017-07-01 NOTE — Progress Notes (Signed)
Waiting for physical therapy evaluation, carotid ultrasound for discharge later today.  Patient feels well and no further dizziness and he she is able to walk to the bathroom.  Unable to tolerate statin secondary to severe muscle pains.

## 2017-07-01 NOTE — Progress Notes (Signed)
OT Cancellation Note  Patient Details Name: Deborah Gibson MRN: [id number] DOB: [birth year]   Cancelled Treatment:    Reason Eval/Treat Not Completed: OT screened, no needs identified, will sign off  Harrel Carina, MS, OTR/L 07/01/2017, 11:55 AM

## 2017-07-02 ENCOUNTER — Telehealth: Payer: Self-pay

## 2017-07-02 NOTE — Telephone Encounter (Signed)
I have made the 1st attempt to contact the patient or family member in charge, in order to follow up from recently being discharged from the hospital. I left a message on voicemail but I will make another attempt at a different time.   Direct call back- [phone] 

## 2017-07-02 NOTE — Telephone Encounter (Signed)
Transition Care Management Follow-up Telephone Call   Date discharged?07/01/2017   How have you been since you were released from the hospital? "better still some dizziness when lying down "   Do you understand why you were in the hospital? yes   Do you understand the discharge instructions? yes   Where were you discharged to? home   Items Reviewed:  Medications reviewed: yes, taking OTC motion sickness medication instead of meclizine prescribed  Allergies reviewed: yes  Dietary changes reviewed: yes  Referrals reviewed: yes   Functional Questionnaire:   Activities of Daily Living (ADLs):   She states they are independent in the following: ambulation, bathing and hygiene, feeding, continence, grooming, toileting and dressing States they require assistance with the following: none   Any transportation issues/concerns?: no   Any patient concerns? no   Confirmed importance and date/time of follow-up visits scheduled yes  Provider Appointment booked with Deborah Gibson on 07/06/2017 at 10:30am   Confirmed with patient if condition begins to worsen call PCP or go to the ER.  Patient was given the office number and encouraged to call back with question or concerns.  : yes

## 2017-07-06 ENCOUNTER — Ambulatory Visit (INDEPENDENT_AMBULATORY_CARE_PROVIDER_SITE_OTHER): Payer: Medicare HMO | Admitting: Family Medicine

## 2017-07-06 ENCOUNTER — Encounter: Payer: Self-pay | Admitting: Family Medicine

## 2017-07-06 VITALS — BP 150/77 | HR 60 | Temp 97.5°F | Ht 65.0 in | Wt 180.8 lb

## 2017-07-06 DIAGNOSIS — E876 Hypokalemia: Secondary | ICD-10-CM | POA: Diagnosis not present

## 2017-07-06 DIAGNOSIS — R040 Epistaxis: Secondary | ICD-10-CM

## 2017-07-06 DIAGNOSIS — R252 Cramp and spasm: Secondary | ICD-10-CM

## 2017-07-06 DIAGNOSIS — E785 Hyperlipidemia, unspecified: Secondary | ICD-10-CM | POA: Diagnosis not present

## 2017-07-06 DIAGNOSIS — R42 Dizziness and giddiness: Secondary | ICD-10-CM

## 2017-07-06 DIAGNOSIS — I6503 Occlusion and stenosis of bilateral vertebral arteries: Secondary | ICD-10-CM

## 2017-07-06 NOTE — Progress Notes (Signed)
BP (!) 150/77 (BP Location: Left Arm, Patient Position: Sitting, Cuff Size: Large)   Pulse 60   Temp (!) 97.5 F (36.4 C) (Oral)   Ht 5\' 5"  (1.651 m)   Wt 180 lb 12.8 oz (82 kg)   SpO2 100%   BMI 30.09 kg/m    Subjective:    Patient ID: Deborah Gibson, female    DOB: Feb 22, 1932, 84 y.o.   MRN: [id number]  HPI: Deborah Gibson is a 84 y.o. female  Chief Complaint  Patient presents with  . Hospitalization Follow-up    Patient states she is a lot better. States she still has a Scientist, clinical (histocompatibility and immunogenetics)" feeling when she lays down, but is better.   Marland Kitchen Epistaxis    Was told to take 325mg  of Aspirin instead of 81mg . Patient states since she's done that, she keeps having nose bleeds.  . Labs    Patient's potaassium and iron was low, patient would like the levels rechecked.   Pt here today for hospital f/u for dizziness/vertigo. Was admitted for stroke r/o. Workup negative, including EKG, echocardiogram, CTA head other than left vertebral artery stenosis. It was concluded that her current sxs were more from BPPV. No longer needing the meclizine, feeling much better overall. Still getting "fuzzy" sensation sometimes when laying down but otherwise resolved. Denies syncope, headaches, visual changes, CP, SOB.   Has a hx of nosebleeds, states they've been worse since being told to increase from 81 mg aspirin to 325 mg. No other changes. Still easily controlled bleeding.   Wanting potassium and iron rechecked as they were found to be slightly low in the hospital. Hypokalemia had resolved with repletion during stay.Also wants magnesium checked as she has been having leg cramps occasionally despite d/c of cholesterol medications.   Past Medical History:  Diagnosis Date  . Arthritis   . Cancer ([place])    skin ca  . GERD (gastroesophageal reflux disease)   . Hyperlipidemia   . Hypertension   . Seasonal allergies    Social History   Socioeconomic History  . Marital status: Widowed    Spouse name: Not on  file  . Number of children: Not on file  . Years of education: Not on file  . Highest education level: Not on file  Occupational History  . Not on file  Social Needs  . Financial resource strain: Not on file  . Food insecurity:    Worry: Not on file    Inability: Not on file  . Transportation needs:    Medical: Not on file    Non-medical: Not on file  Tobacco Use  . Smoking status: Former Smoker    Packs/day: 3.00    Years: 23.00    Pack years: 69.00    Types: Cigarettes    Last attempt to quit: 04/15/1978    Years since quitting: 39.2  . Smokeless tobacco: Never Used  Substance and Sexual Activity  . Alcohol use: No    Alcohol/week: 0.0 oz  . Drug use: No  . Sexual activity: Not on file  Lifestyle  . Physical activity:    Days per week: Not on file    Minutes per session: Not on file  . Stress: Not on file  Relationships  . Social connections:    Talks on phone: Not on file    Gets together: Not on file    Attends religious service: Not on file    Active member of club or organization: Not on file  Attends meetings of clubs or organizations: Not on file    Relationship status: Not on file  . Intimate partner violence:    Fear of current or ex partner: Not on file    Emotionally abused: Not on file    Physically abused: Not on file    Forced sexual activity: Not on file  Other Topics Concern  . Not on file  Social History Narrative   ** Merged History Encounter **       Relevant past medical, surgical, family and social history reviewed and updated as indicated. Interim medical history since our last visit reviewed. Allergies and medications reviewed and updated.  Review of Systems  Per HPI unless specifically indicated above     Objective:    BP (!) 150/77 (BP Location: Left Arm, Patient Position: Sitting, Cuff Size: Large)   Pulse 60   Temp (!) 97.5 F (36.4 C) (Oral)   Ht 5\' 5"  (1.651 m)   Wt 180 lb 12.8 oz (82 kg)   SpO2 100%   BMI 30.09 kg/m    Wt Readings from Last 3 Encounters:  07/06/17 180 lb 12.8 oz (82 kg)  06/29/17 179 lb (81.2 kg)  02/17/17 181 lb (82.1 kg)    Physical Exam  Constitutional: She is oriented to person, place, and time. She appears well-developed and well-nourished. No distress.  HENT:  Head: Atraumatic.  Right Ear: External ear normal.  Left Ear: External ear normal.  Eyes: Pupils are equal, round, and reactive to light. Conjunctivae and EOM are normal.  Neck: Normal range of motion. Neck supple.  Cardiovascular: Normal rate and normal heart sounds.  Musculoskeletal: Normal range of motion. She exhibits no edema.  Lymphadenopathy:    She has no cervical adenopathy.  Neurological: She is alert and oriented to person, place, and time. No cranial nerve deficit.  Skin: Skin is warm and dry.  Psychiatric: She has a normal mood and affect. Her behavior is normal.  Nursing note and vitals reviewed.   Results for orders placed or performed in visit on 16/60/63  Basic Metabolic Panel (BMET)  Result Value Ref Range   Glucose 100 (H) 65 - 99 mg/dL   BUN 15 8 - 27 mg/dL   Creatinine, Ser 0.80 0.57 - 1.00 mg/dL   GFR calc non Af Amer 68 >59 mL/min/1.73   GFR calc Af Amer 79 >59 mL/min/1.73   BUN/Creatinine Ratio 19 12 - 28   Sodium 139 134 - 144 mmol/L   Potassium 4.9 3.5 - 5.2 mmol/L   Chloride 99 96 - 106 mmol/L   CO2 24 20 - 29 mmol/L   Calcium 9.8 8.7 - 10.3 mg/dL  Magnesium  Result Value Ref Range   Magnesium 2.0 1.6 - 2.3 mg/dL  CBC with Differential/Platelet  Result Value Ref Range   WBC 5.3 3.4 - 10.8 x10E3/uL   RBC 4.54 3.77 - 5.28 x10E6/uL   Hemoglobin 13.6 11.1 - 15.9 g/dL   Hematocrit 40.4 34.0 - 46.6 %   MCV 89 79 - 97 fL   MCH 30.0 26.6 - 33.0 pg   MCHC 33.7 31.5 - 35.7 g/dL   RDW 14.1 12.3 - 15.4 %   Platelets 209 150 - 379 x10E3/uL   Neutrophils 45 Not Estab. %   Lymphs 44 Not Estab. %   Monocytes 7 Not Estab. %   Eos 3 Not Estab. %   Basos 1 Not Estab. %   Neutrophils  Absolute 2.4 1.4 - 7.0 x10E3/uL  Lymphocytes Absolute 2.4 0.7 - 3.1 x10E3/uL   Monocytes Absolute 0.4 0.1 - 0.9 x10E3/uL   EOS (ABSOLUTE) 0.2 0.0 - 0.4 x10E3/uL   Basophils Absolute 0.0 0.0 - 0.2 x10E3/uL   Immature Granulocytes 0 Not Estab. %   Immature Grans (Abs) 0.0 0.0 - 0.1 x10E3/uL      Assessment & Plan:   Problem List Items Addressed This Visit      Cardiovascular and Mediastinum   Vertebral artery stenosis    Intolerant to statins, will continue aspirin (at reduced dose for now due to worsened nosebleeds) and look into starting alternative cholesterol therapy. Pt hesitant at this time.         Other   Hyperlipidemia    Will consider alternative cholesterol therapy given statin intolerance and new finding of vertebral artery stenosis b/l, LDL in hospital found to be 138.       Dizziness - Primary    Suspected to be more from BPPV, stroke was ruled out during admission. Sxs resolved at this time, no longer taking symptomatic medications. Will watch closely for recurrence, pt to call if sxs come back       Other Visit Diagnoses    Hypokalemia       Recheck potassium today. Push fluids, eat high potassium foods. WIll supplement if needed   Relevant Orders   Basic Metabolic Panel (BMET) (Completed)   Nosebleed       Will check platelets today, reduce to 2 tabs of 81 mg ASA and monitor for improvement in nosebleeds. Humidifier, vaseline inside nostrils   Relevant Orders   CBC with Differential/Platelet (Completed)   Leg cramps       WIll check mag level today. Push fluids, regular stretching and exercise.    Relevant Orders   Magnesium (Completed)       Follow up plan: Return for as scheduled.

## 2017-07-07 LAB — CBC WITH DIFFERENTIAL/PLATELET
Basophils Absolute: 0 10*3/uL (ref 0.0–0.2)
Basos: 1 %
EOS (ABSOLUTE): 0.2 10*3/uL (ref 0.0–0.4)
Eos: 3 %
HEMATOCRIT: 40.4 % (ref 34.0–46.6)
HEMOGLOBIN: 13.6 g/dL (ref 11.1–15.9)
Immature Grans (Abs): 0 10*3/uL (ref 0.0–0.1)
Immature Granulocytes: 0 %
LYMPHS ABS: 2.4 10*3/uL (ref 0.7–3.1)
Lymphs: 44 %
MCH: 30 pg (ref 26.6–33.0)
MCHC: 33.7 g/dL (ref 31.5–35.7)
MCV: 89 fL (ref 79–97)
MONOCYTES: 7 %
MONOS ABS: 0.4 10*3/uL (ref 0.1–0.9)
NEUTROS ABS: 2.4 10*3/uL (ref 1.4–7.0)
Neutrophils: 45 %
Platelets: 209 10*3/uL (ref 150–379)
RBC: 4.54 x10E6/uL (ref 3.77–5.28)
RDW: 14.1 % (ref 12.3–15.4)
WBC: 5.3 10*3/uL (ref 3.4–10.8)

## 2017-07-07 LAB — BASIC METABOLIC PANEL
BUN / CREAT RATIO: 19 (ref 12–28)
BUN: 15 mg/dL (ref 8–27)
CO2: 24 mmol/L (ref 20–29)
Calcium: 9.8 mg/dL (ref 8.7–10.3)
Chloride: 99 mmol/L (ref 96–106)
Creatinine, Ser: 0.8 mg/dL (ref 0.57–1.00)
GFR calc Af Amer: 79 mL/min/{1.73_m2} (ref >59)
GFR calc non Af Amer: 68 mL/min/{1.73_m2} (ref >59)
GLUCOSE: 100 mg/dL — AB (ref 65–99)
POTASSIUM: 4.9 mmol/L (ref 3.5–5.2)
SODIUM: 139 mmol/L (ref 134–144)

## 2017-07-07 LAB — MAGNESIUM: Magnesium: 2 mg/dL (ref 1.6–2.3)

## 2017-07-09 ENCOUNTER — Inpatient Hospital Stay: Payer: Medicare HMO | Admitting: Family Medicine

## 2017-07-09 NOTE — Assessment & Plan Note (Addendum)
Will consider alternative cholesterol therapy given statin intolerance and new finding of vertebral artery stenosis b/l, LDL in hospital found to be 138.

## 2017-07-09 NOTE — Patient Instructions (Signed)
Follow up as scheduled.  

## 2017-07-09 NOTE — Assessment & Plan Note (Signed)
Suspected to be more from BPPV, stroke was ruled out during admission. Sxs resolved at this time, no longer taking symptomatic medications. Will watch closely for recurrence, pt to call if sxs come back

## 2017-07-09 NOTE — Assessment & Plan Note (Signed)
Intolerant to statins, will continue aspirin (at reduced dose for now due to worsened nosebleeds) and look into starting alternative cholesterol therapy. Pt hesitant at this time.

## 2017-07-22 ENCOUNTER — Emergency Department: Payer: Medicare HMO

## 2017-07-22 ENCOUNTER — Encounter: Payer: Self-pay | Admitting: Emergency Medicine

## 2017-07-22 ENCOUNTER — Emergency Department
Admission: EM | Admit: 2017-07-22 | Discharge: 2017-07-23 | Disposition: A | Discharge status: Home / Self Care | Payer: Medicare HMO | Attending: Emergency Medicine | Admitting: Emergency Medicine

## 2017-07-22 DIAGNOSIS — I2510 Atherosclerotic heart disease of native coronary artery without angina pectoris: Secondary | ICD-10-CM | POA: Diagnosis not present

## 2017-07-22 DIAGNOSIS — R51 Headache: Secondary | ICD-10-CM | POA: Diagnosis not present

## 2017-07-22 DIAGNOSIS — I10 Essential (primary) hypertension: Secondary | ICD-10-CM | POA: Insufficient documentation

## 2017-07-22 DIAGNOSIS — Z79899 Other long term (current) drug therapy: Secondary | ICD-10-CM | POA: Diagnosis not present

## 2017-07-22 DIAGNOSIS — I6789 Other cerebrovascular disease: Secondary | ICD-10-CM | POA: Diagnosis not present

## 2017-07-22 DIAGNOSIS — Z87891 Personal history of nicotine dependence: Secondary | ICD-10-CM | POA: Insufficient documentation

## 2017-07-22 DIAGNOSIS — K8020 Calculus of gallbladder without cholecystitis without obstruction: Secondary | ICD-10-CM | POA: Diagnosis not present

## 2017-07-22 DIAGNOSIS — R1111 Vomiting without nausea: Secondary | ICD-10-CM | POA: Diagnosis not present

## 2017-07-22 DIAGNOSIS — R42 Dizziness and giddiness: Secondary | ICD-10-CM | POA: Diagnosis not present

## 2017-07-22 DIAGNOSIS — Z955 Presence of coronary angioplasty implant and graft: Secondary | ICD-10-CM | POA: Diagnosis not present

## 2017-07-22 DIAGNOSIS — R0789 Other chest pain: Secondary | ICD-10-CM | POA: Insufficient documentation

## 2017-07-22 DIAGNOSIS — R61 Generalized hyperhidrosis: Secondary | ICD-10-CM | POA: Diagnosis not present

## 2017-07-22 DIAGNOSIS — R079 Chest pain, unspecified: Secondary | ICD-10-CM | POA: Diagnosis not present

## 2017-07-22 LAB — BASIC METABOLIC PANEL
ANION GAP: 11 (ref 5–15)
BUN: 22 mg/dL — ABNORMAL HIGH (ref 6–20)
CHLORIDE: 100 mmol/L — AB (ref 101–111)
CO2: 27 mmol/L (ref 22–32)
Calcium: 9.1 mg/dL (ref 8.9–10.3)
Creatinine, Ser: 0.8 mg/dL (ref 0.44–1.00)
GFR calc Af Amer: >60 mL/min (ref >60)
Glucose, Bld: 141 mg/dL — ABNORMAL HIGH (ref 65–99)
POTASSIUM: 3 mmol/L — AB (ref 3.5–5.1)
Sodium: 138 mmol/L (ref 135–145)

## 2017-07-22 LAB — TROPONIN I: Troponin I: <0.03 ng/mL (ref <0.03)

## 2017-07-22 LAB — CBC
HEMATOCRIT: 38.9 % (ref 35.0–47.0)
HEMOGLOBIN: 13.6 g/dL (ref 12.0–16.0)
MCH: 30.2 pg (ref 26.0–34.0)
MCHC: 34.9 g/dL (ref 32.0–36.0)
MCV: 86.3 fL (ref 80.0–100.0)
Platelets: 208 10*3/uL (ref 150–440)
RBC: 4.51 MIL/uL (ref 3.80–5.20)
RDW: 13.2 % (ref 11.5–14.5)
WBC: 7.3 10*3/uL (ref 3.6–11.0)

## 2017-07-22 MED ORDER — IOPAMIDOL (ISOVUE-370) INJECTION 76%
100.0000 mL | Freq: Once | INTRAVENOUS | Status: AC | PRN
  Administered 2017-07-22: 100 mL via INTRAVENOUS

## 2017-07-22 MED ORDER — METOCLOPRAMIDE HCL 5 MG/ML IJ SOLN
10.0000 mg | Freq: Once | INTRAMUSCULAR | Status: AC
  Administered 2017-07-22: 10 mg via INTRAVENOUS
  Filled 2017-07-22: qty 2

## 2017-07-22 MED ORDER — ONDANSETRON HCL 4 MG/2ML IJ SOLN
4.0000 mg | Freq: Once | INTRAMUSCULAR | Status: AC
  Administered 2017-07-22: 4 mg via INTRAVENOUS

## 2017-07-22 MED ORDER — METOCLOPRAMIDE HCL 5 MG/ML IJ SOLN: 10.0000 mg | Freq: Once | INTRAMUSCULAR | Status: DC

## 2017-07-22 MED ORDER — MORPHINE SULFATE (PF) 4 MG/ML IV SOLN
4.0000 mg | Freq: Once | INTRAVENOUS | Status: AC
  Administered 2017-07-22: 4 mg via INTRAVENOUS
  Filled 2017-07-22: qty 1

## 2017-07-22 MED ORDER — MECLIZINE HCL 25 MG PO TABS: 25.0000 mg | ORAL_TABLET | Freq: Three times a day (TID) | ORAL | 0 refills | Status: DC | PRN

## 2017-07-22 MED ORDER — MECLIZINE HCL 25 MG PO TABS
25.0000 mg | ORAL_TABLET | Freq: Once | ORAL | Status: AC
  Administered 2017-07-22: 25 mg via ORAL
  Filled 2017-07-22: qty 1

## 2017-07-22 MED ORDER — MECLIZINE HCL 25 MG PO TABS
12.5000 mg | ORAL_TABLET | Freq: Once | ORAL | Status: AC
  Administered 2017-07-22: 12.5 mg via ORAL
  Filled 2017-07-22: qty 1

## 2017-07-22 NOTE — ED Triage Notes (Signed)
Pt arrived via EMS from home where report is, pt returned home from church and experienced sudden onset of chest pain, N/V. Pt is A&O x4, dysphoretic on arrival with dry heaving. MD at  Bedside.

## 2017-07-22 NOTE — ED Notes (Signed)
Pt oxygen saturation 68% on RA.  When entering room patient was sleeping but easily aroused.  2L [place] placed on patient and immediately increased to 99%.

## 2017-07-22 NOTE — ED Provider Notes (Signed)
[facility] Emergency Department Provider Note ____________________________________________   First MD Initiated Contact with Patient 07/22/17 2024     (approximate)  I have reviewed the triage vital signs and the nursing notes.   HISTORY  Chief Complaint Chest Pain    HPI Deborah Gibson is a 84 y.o. female with PMH as noted below who presents with chest pain, acute onset approximately 1 hour ago, described as pressure-like, and radiating to the back into the neck.  Patient states it is associated with severe dizziness, described as movement/spinning.  She feels nauseous, and has been dry heaving, and she feels short of breath.  She states that the symptoms are similar to those she experienced about a month ago that caused her to come to the emergency department, but the chest pain is more severe this time.   Past Medical History:  Diagnosis Date  . Arthritis   . Cancer ([place])    skin ca  . GERD (gastroesophageal reflux disease)   . Hyperlipidemia   . Hypertension   . Seasonal allergies     Patient Active Problem List   Diagnosis Date Noted  . Dizziness 06/30/2017  . Vertebral artery stenosis 06/30/2017  . Osteoarthritis of right hip 03/03/2017  . Advanced care planning/counseling discussion 02/17/2017  . Calculus of gallbladder without cholecystitis without obstruction 02/24/2016  . PVD (peripheral vascular disease) ([place]) 01/29/2016  . Leg pain 12/03/2015  . Hoarseness of voice 11/22/2014  . Coronary artery disease 05/23/2014  . Hyperlipidemia 05/01/2014  . Family history of early CAD 05/01/2014  . Essential hypertension 05/01/2014  . Smoking history 05/01/2014  . Esophageal dysmotility 05/01/2014  . Gastroesophageal reflux disease without esophagitis 05/01/2014    Past Surgical History:  Procedure Laterality Date  . ABDOMINAL HYSTERECTOMY    . BLADDER SUSPENSION    . [place], [place]  . CATARACT  EXTRACTION    . CORONARY ANGIOPLASTY  2016  . CYSTOCELE REPAIR    . ESOPHAGOGASTRODUODENOSCOPY (EGD) WITH PROPOFOL N/A 03/12/2016   Procedure: ESOPHAGOGASTRODUODENOSCOPY (EGD) WITH PROPOFOL;  Surgeon: Jonathon Bellows, MD;  Location: [facility] ENDOSCOPY;  Service: Endoscopy;  Laterality: N/A;  . EYE SURGERY     cataract surgery  . rectal vaginal fistula repair    . TONSILLECTOMY    . VAGINAL HYSTERECTOMY      Prior to Admission medications   Medication Sig Start Date End Date Taking? Authorizing Provider  aspirin EC 81 MG tablet Take 81 mg by mouth daily.    [provider]  carvedilol (COREG) 25 MG tablet Take 1 tablet (25 mg total) by mouth 2 (two) times daily with a meal. 02/17/17   Crissman, Jeannette How, MD  cetirizine (ZYRTEC) 10 MG tablet Take 10 mg by mouth daily.    [provider]  Coenzyme Q10 (COQ10) 200 MG CAPS Take 1 capsule by mouth daily.     [provider]  fluticasone (FLONASE) 50 MCG/ACT nasal spray Place 2 sprays into both nostrils daily as needed.     [provider]  hydrochlorothiazide (HYDRODIURIL) 25 MG tablet Take 1 tablet (25 mg total) by mouth daily. 02/17/17   Guadalupe Maple, MD  hydrocortisone 2.5 % cream  06/29/17   [provider]  meclizine (ANTIVERT) 25 MG tablet Take 1 tablet (25 mg total) by mouth 3 (three) times daily as needed for dizziness. 07/01/17   Epifanio Lesches, MD  pantoprazole (PROTONIX) 40 MG tablet Take 40 mg by mouth  daily.    [provider]  vitamin B-12 (CYANOCOBALAMIN) 500 MCG tablet Take 500 mcg by mouth daily.    [provider]    Allergies Scopolamine; Amlodipine; Amlodipine; Benazepril; Codeine; Crestor [rosuvastatin]; Lovastatin; Scopolamine; Statins; and Zocor [simvastatin]  Family History  Problem Relation Age of Onset  . Hypertension Mother   . Heart failure Mother   . Hyperlipidemia Mother   . Stroke Mother   . Cancer Sister   . Heart disease Sister   . Hypertension  Sister   . Heart disease Father   . Diabetes Brother   . Heart disease Brother        pacer/defiib  . Hypertension Brother   . Heart attack Brother   . Heart disease Brother        CABG  . Leukemia Daughter     Social History Social History   Tobacco Use  . Smoking status: Former Smoker    Packs/day: 3.00    Years: 23.00    Pack years: 69.00    Types: Cigarettes    Last attempt to quit: 04/15/1978    Years since quitting: 39.2  . Smokeless tobacco: Never Used  Substance Use Topics  . Alcohol use: No    Alcohol/week: 0.0 oz  . Drug use: No    Review of Systems  Constitutional: No fever. Eyes: No visual changes. ENT: Positive for neck pain. Cardiovascular: Positive for chest pain. Respiratory: Positive for shortness of breath. Gastrointestinal: Positive for vomiting.  Genitourinary: Negative for flank pain.  Musculoskeletal: Positive for back pain. Skin: Negative for rash. Neurological: Negative for headache.   ____________________________________________   PHYSICAL EXAM:  VITAL SIGNS: ED Triage Vitals  Enc Vitals Group     BP 07/22/17 2021 (!) 175/61     Pulse Rate 07/22/17 2019 70     Resp 07/22/17 2019 18     Temp 07/22/17 2019 98 F (36.7 C)     Temp Source 07/22/17 2019 Oral     SpO2 07/22/17 2019 99 %     Weight 07/22/17 2020 177 lb (80.3 kg)     Height --      Head Circumference --      Peak Flow --      Pain Score 07/22/17 2020 8     Pain Loc --      Pain Edu? --      Excl. in [place]? --     Constitutional: Alert and oriented.  Uncomfortable appearing. Eyes: Conjunctivae are normal.  EOMI.  PERRLA.  No nystagmus. Head: Atraumatic. Nose: No congestion/rhinnorhea. Mouth/Throat: Mucous membranes are slightly dry.   Neck: Normal range of motion.  Nontender. Cardiovascular: Normal rate, regular rhythm. Grossly normal heart sounds.  Good peripheral circulation. Respiratory: Normal respiratory effort.  No retractions. Lungs  CTAB. Gastrointestinal: Soft and nontender. No distention.  Genitourinary: No flank tenderness. Musculoskeletal: No lower extremity edema.  Extremities warm and well perfused.  Neurologic:  Normal speech and language.  Motor intact in all extremities.  Normal coordination.  No gross focal neurologic deficits are appreciated.  Skin:  Skin is warm and dry. No rash noted. Psychiatric: Mood and affect are normal. Speech and behavior are normal.  ____________________________________________   LABS (all labs ordered are listed, but only abnormal results are displayed)  Labs Reviewed  BASIC METABOLIC PANEL - Abnormal; Notable for the following components:      Result Value   Potassium 3.0 (*)    Chloride 100 (*)    Glucose,  Bld 141 (*)    BUN 22 (*)    All other components within normal limits  CBC  TROPONIN I  TROPONIN I   ____________________________________________  EKG  ED ECG REPORT I, Arta Silence, the attending physician, personally viewed and interpreted this ECG.  Date: 07/22/2017 EKG Time: 2020 Rate: 73 Rhythm: normal sinus rhythm QRS Axis: normal Intervals: normal ST/T Wave abnormalities: normal Narrative Interpretation: no evidence of acute ischemia  ____________________________________________  RADIOLOGY  CXR: No focal infiltrate or other acute findings CT head: No ICH or other acute findings CT angio chest/abd: No acute findings  ____________________________________________   PROCEDURES  Procedure(s) performed: No  Procedures  Critical Care performed: No ____________________________________________   INITIAL IMPRESSION / ASSESSMENT AND PLAN / ED COURSE  Pertinent labs & imaging results that were available during my care of the patient were reviewed by me and considered in my medical decision making (see chart for details).  84 year old female with PMH as noted above presents with acute onset of chest pain associated with dizziness,  nausea, and vomiting.  The pain radiates somewhat to the back.  She states it is similar to symptoms that she had when she came to the ED earlier this month.  I reviewed the past medical records in Epic; the patient was seen in the ED on 06/29/2017 for severe dizziness, diagnosed as vertigo.  She was admitted.  She had CT angiogram of the head and neck as well as MRI, which were negative, and the discharge diagnosis was benign positional vertigo.  On exam today, the patient is uncomfortable appearing, slightly diaphoretic, and is hypertensive.  Other vital signs are normal.  The remainder the exam is as described above.  Neuro exam is nonfocal.  Overall I suspect this is most likely a recurrence of her vertigo given the patient's own report of the similarity to the prior episode, however the presence of chest pain radiating to the back, the patient's hypertension and diaphoresis concerning for ACS or less likely but possible aortic dissection or other vascular etiology.  Plan: Symptom control with antiemetic and meclizine, chest x-ray, basic and cardiac labs, and CT angio chest/abdomen to rule out dissection.  If negative work-up and symptoms completely resolve, consider discharge home; however patient may require admission if symptoms persist.  Clinical Course as of Jul 23 2327  Thu Jul 22, 2017  2227 Hemoglobin: 13.6 [SS]    Clinical Course User Index [SS] Arta Silence, MD   ----------------------------------------- 11:27 PM on 07/22/2017 -----------------------------------------  Patient symptoms initially improved although she then had another episode of vomiting.  Reglan was given, the patient is now more comfortable.  I ordered a second dose of meclizine since the patient likely vomited most of the first.    Patient's imaging is negative for acute findings to explain her pain.  Her first troponin is also negative, and her other labs are unremarkable.  Overall presentation is most  consistent with peripheral vertigo as she was diagnosed with the last time.  Given the presence of the chest pain, we will plan to obtain a second troponin.  If the second troponin is negative and the patient's symptoms further improve and she feels well, consider possible discharge home.  If she has persistent vomiting or severe dizziness, she may require admission for further treatment of the vertigo.  I am signing the patient out to the oncoming physician Dr. Mable Paris.  ____________________________________________   FINAL CLINICAL IMPRESSION(S) / ED DIAGNOSES  Final diagnoses:  Vertigo  Atypical  chest pain      NEW MEDICATIONS STARTED DURING THIS VISIT:  New Prescriptions   No medications on file     Note:  This document was prepared using Dragon voice recognition software and may include unintentional dictation errors.    Arta Silence, MD 07/22/17 2329

## 2017-07-23 LAB — TROPONIN I

## 2017-07-23 NOTE — ED Provider Notes (Signed)
Second trop negative.  Dc according to Dr. Kennith Gain plan.   Darel Hong, MD 07/23/17 [id number]

## 2017-08-17 ENCOUNTER — Ambulatory Visit: Payer: Medicare HMO | Admitting: Family Medicine

## 2017-09-14 ENCOUNTER — Encounter: Payer: Self-pay | Admitting: Family Medicine

## 2017-09-14 ENCOUNTER — Ambulatory Visit (INDEPENDENT_AMBULATORY_CARE_PROVIDER_SITE_OTHER): Payer: Medicare HMO | Admitting: Family Medicine

## 2017-09-14 VITALS — BP 162/74 | HR 61 | Ht 64.0 in | Wt 178.4 lb

## 2017-09-14 DIAGNOSIS — E785 Hyperlipidemia, unspecified: Secondary | ICD-10-CM | POA: Diagnosis not present

## 2017-09-14 DIAGNOSIS — I739 Peripheral vascular disease, unspecified: Secondary | ICD-10-CM | POA: Diagnosis not present

## 2017-09-14 DIAGNOSIS — I2510 Atherosclerotic heart disease of native coronary artery without angina pectoris: Secondary | ICD-10-CM | POA: Diagnosis not present

## 2017-09-14 DIAGNOSIS — M79604 Pain in right leg: Secondary | ICD-10-CM | POA: Diagnosis not present

## 2017-09-14 DIAGNOSIS — I10 Essential (primary) hypertension: Secondary | ICD-10-CM | POA: Diagnosis not present

## 2017-09-14 LAB — LP+ALT+AST PICCOLO, WAIVED
ALT (SGPT) Piccolo, Waived: 28 U/L (ref 10–47)
AST (SGOT) PICCOLO, WAIVED: 42 U/L — AB (ref 11–38)
CHOL/HDL RATIO PICCOLO,WAIVE: 4.6 mg/dL
CHOLESTEROL PICCOLO, WAIVED: 229 mg/dL — AB (ref <200)
HDL CHOL PICCOLO, WAIVED: 49 mg/dL — AB (ref >59)
LDL Chol Calc Piccolo Waived: 104 mg/dL — ABNORMAL HIGH (ref <100)
Triglycerides Piccolo,Waived: 379 mg/dL — ABNORMAL HIGH (ref <150)
VLDL Chol Calc Piccolo,Waive: 76 mg/dL — ABNORMAL HIGH (ref <30)

## 2017-09-14 MED ORDER — MELOXICAM 7.5 MG PO TABS: 7.5000 mg | ORAL_TABLET | Freq: Every day | ORAL | 3 refills | Status: DC

## 2017-09-14 NOTE — Assessment & Plan Note (Signed)
The current medical regimen is effective;  continue present plan and medications.  

## 2017-09-14 NOTE — Assessment & Plan Note (Signed)
Leg hip pain. Patient with x-ray approximately 6 months ago showing some mild arthritis changes may be contributing to patient's symptoms. PAD work-up a year and a half ago or so was negative.  Patient still with multiple risk factors for PAD.  Will refer back for further evaluation to vascular for PAD evaluation.  Will consider and start treatment with meloxicam for arthritis.

## 2017-09-14 NOTE — Progress Notes (Signed)
BP (!) 162/74   Pulse 61   Ht 5\' 4"  (1.626 m)   Wt 178 lb 6.4 oz (80.9 kg)   SpO2 96%   BMI 30.62 kg/m    Subjective:    Patient ID: Deborah Gibson, female    DOB: 1931/09/15, 84 y.o.   MRN: [id number]  HPI: Deborah Gibson is a 84 y.o. female  Chief Complaint  Patient presents with  . Follow-up  . Hypertension  . Hyperlipidemia   Patient all in all doing well blood pressure elevated has intermittently elevated blood pressure readings especially on chart review patient had intermittently elevated readings.  Taking carvedilol 25 twice a day and hydrochlorothiazide 25 once a day without problems.  Patient has not taken her medications this morning.  Patient's biggest problem is hip and leg pain.  Leg pain and hip pain comes on with trying to walk fast.  When going very slowly does not have hip or leg pain.  Standing does not produce this discomfort. Chart reviewed hip x-ray done about 7 months ago showed some degenerative arthritis but nothing extensive. About 19 months ago patient had vascular studies which were negative for PAD.  Relevant past medical, surgical, family and social history reviewed and updated as indicated. Interim medical history since our last visit reviewed. Allergies and medications reviewed and updated.  Review of Systems  Constitutional: Negative.   Respiratory: Negative.   Cardiovascular: Negative.     Per HPI unless specifically indicated above     Objective:    BP (!) 162/74   Pulse 61   Ht 5\' 4"  (1.626 m)   Wt 178 lb 6.4 oz (80.9 kg)   SpO2 96%   BMI 30.62 kg/m   Wt Readings from Last 3 Encounters:  09/14/17 178 lb 6.4 oz (80.9 kg)  07/22/17 177 lb (80.3 kg)  07/06/17 180 lb 12.8 oz (82 kg)    Physical Exam  Constitutional: She is oriented to person, place, and time. She appears well-developed and well-nourished.  HENT:  Head: Normocephalic and atraumatic.  Eyes: Conjunctivae and EOM are normal.  Neck: Normal range of motion.    Cardiovascular: Normal rate, regular rhythm and normal heart sounds.  Pulmonary/Chest: Effort normal and breath sounds normal.  Musculoskeletal: Normal range of motion.  Neurological: She is alert and oriented to person, place, and time.  Skin: No erythema.  Psychiatric: She has a normal mood and affect. Her behavior is normal. Judgment and thought content normal.    Results for orders placed or performed during the hospital encounter of 13/24/40  Basic metabolic panel  Result Value Ref Range   Sodium 138 135 - 145 mmol/L   Potassium 3.0 (L) 3.5 - 5.1 mmol/L   Chloride 100 (L) 101 - 111 mmol/L   CO2 27 22 - 32 mmol/L   Glucose, Bld 141 (H) 65 - 99 mg/dL   BUN 22 (H) 6 - 20 mg/dL   Creatinine, Ser 0.80 0.44 - 1.00 mg/dL   Calcium 9.1 8.9 - 10.3 mg/dL   GFR calc non Af Amer >60 >60 mL/min   GFR calc Af Amer >60 >60 mL/min   Anion gap 11 5 - 15  CBC  Result Value Ref Range   WBC 7.3 3.6 - 11.0 K/uL   RBC 4.51 3.80 - 5.20 MIL/uL   Hemoglobin 13.6 12.0 - 16.0 g/dL   HCT 38.9 35.0 - 47.0 %   MCV 86.3 80.0 - 100.0 fL   MCH 30.2 26.0 -  34.0 pg   MCHC 34.9 32.0 - 36.0 g/dL   RDW 13.2 11.5 - 14.5 %   Platelets 208 150 - 440 K/uL  Troponin I  Result Value Ref Range   Troponin I <0.03 <0.03 ng/mL  Troponin I  Result Value Ref Range   Troponin I <0.03 <0.03 ng/mL      Assessment & Plan:   Problem List Items Addressed This Visit      Cardiovascular and Mediastinum   Essential hypertension - Primary    Discussed hypertension poor control off medications. Patient will recheck on medications to make sure blood pressures controlled if not will make office visit sooner than later.      Relevant Orders   Basic metabolic panel   LP+ALT+AST Piccolo, Waived   Coronary artery disease    The current medical regimen is effective;  continue present plan and medications.       PVD (peripheral vascular disease) ([place])   Relevant Orders   Ambulatory referral to Vascular Surgery      Other   Hyperlipidemia    The current medical regimen is effective;  continue present plan and medications.       Relevant Orders   Basic metabolic panel   LP+ALT+AST Piccolo, Waived   Leg pain    Leg hip pain. Patient with x-ray approximately 6 months ago showing some mild arthritis changes may be contributing to patient's symptoms. PAD work-up a year and a half ago or so was negative.  Patient still with multiple risk factors for PAD.  Will refer back for further evaluation to vascular for PAD evaluation.  Will consider and start treatment with meloxicam for arthritis.          Follow up plan: Return in about 6 months (around 03/16/2018) for Physical Exam.

## 2017-09-14 NOTE — Assessment & Plan Note (Signed)
Discussed hypertension poor control off medications. Patient will recheck on medications to make sure blood pressures controlled if not will make office visit sooner than later.

## 2017-09-15 ENCOUNTER — Other Ambulatory Visit: Payer: Self-pay

## 2017-09-15 ENCOUNTER — Encounter: Payer: Self-pay | Admitting: Family Medicine

## 2017-09-15 DIAGNOSIS — I739 Peripheral vascular disease, unspecified: Secondary | ICD-10-CM

## 2017-09-15 LAB — BASIC METABOLIC PANEL
BUN / CREAT RATIO: 16 (ref 12–28)
BUN: 12 mg/dL (ref 8–27)
CHLORIDE: 101 mmol/L (ref 96–106)
CO2: 24 mmol/L (ref 20–29)
Calcium: 9.4 mg/dL (ref 8.7–10.3)
Creatinine, Ser: 0.77 mg/dL (ref 0.57–1.00)
GFR calc Af Amer: 83 mL/min/{1.73_m2} (ref >59)
GFR calc non Af Amer: 72 mL/min/{1.73_m2} (ref >59)
GLUCOSE: 97 mg/dL (ref 65–99)
POTASSIUM: 3.9 mmol/L (ref 3.5–5.2)
Sodium: 140 mmol/L (ref 134–144)

## 2017-10-14 ENCOUNTER — Ambulatory Visit: Payer: Self-pay

## 2017-10-22 ENCOUNTER — Ambulatory Visit: Payer: Self-pay

## 2017-10-25 ENCOUNTER — Encounter (HOSPITAL_COMMUNITY): Payer: Self-pay

## 2017-10-25 ENCOUNTER — Encounter: Payer: Self-pay | Admitting: Vascular Surgery

## 2017-10-26 DIAGNOSIS — L820 Inflamed seborrheic keratosis: Secondary | ICD-10-CM | POA: Diagnosis not present

## 2017-10-26 DIAGNOSIS — L565 Disseminated superficial actinic porokeratosis (DSAP): Secondary | ICD-10-CM | POA: Diagnosis not present

## 2017-10-26 DIAGNOSIS — Z85828 Personal history of other malignant neoplasm of skin: Secondary | ICD-10-CM | POA: Diagnosis not present

## 2017-10-26 DIAGNOSIS — L578 Other skin changes due to chronic exposure to nonionizing radiation: Secondary | ICD-10-CM | POA: Diagnosis not present

## 2017-10-26 DIAGNOSIS — L821 Other seborrheic keratosis: Secondary | ICD-10-CM | POA: Diagnosis not present

## 2017-10-27 ENCOUNTER — Ambulatory Visit (INDEPENDENT_AMBULATORY_CARE_PROVIDER_SITE_OTHER): Payer: Medicare HMO

## 2017-10-27 VITALS — BP 132/70 | HR 62 | Temp 97.9°F | Resp 16 | Ht 65.5 in | Wt 179.0 lb

## 2017-10-27 DIAGNOSIS — Z0000 Encounter for general adult medical examination without abnormal findings: Secondary | ICD-10-CM | POA: Diagnosis not present

## 2017-10-27 NOTE — Progress Notes (Signed)
Subjective:   Deborah Gibson is a 84 y.o. female who presents for Medicare Annual (Subsequent) preventive examination.  Review of Systems:  Cardiac Risk Factors include: advanced age (>6men, >41 women)     Objective:     Vitals: BP 132/70 (BP Location: Left Arm, Patient Position: Sitting)   Pulse 62   Temp 97.9 F (36.6 C) (Temporal)   Resp 16   Ht 5' 5.5" (1.664 m)   Wt 179 lb (81.2 kg)   BMI 29.33 kg/m   Body mass index is 29.33 kg/m.  Advanced Directives 10/27/2017 06/30/2017 10/14/2016 03/12/2016 02/18/2016 01/29/2016 12/19/2014  Does Patient Have a Medical Advance Directive? No No No No No No No  Would patient like information on creating a medical advance directive? No - Patient declined No - Patient declined Yes (MAU/Ambulatory/Procedural Areas - Information given) No - Patient declined No - patient declined information - -    Tobacco Social History   Tobacco Use  Smoking Status Former Smoker  . Packs/day: 3.00  . Years: 23.00  . Pack years: 69.00  . Types: Cigarettes  . Last attempt to quit: 04/15/1978  . Years since quitting: 39.5  Smokeless Tobacco Never Used     Counseling given: Not Answered   Clinical Intake:  Pre-visit preparation completed: Yes  Pain : No/denies pain Pain Score: 0-No pain     Nutritional Status: BMI 25 -29 Overweight Nutritional Risks: None Diabetes: No  How often do you need to have someone help you when you read instructions, pamphlets, or other written materials from your doctor or pharmacy?: 1 - Never What is the last grade level you completed in school?: high school   Interpreter Needed?: No  Information entered by :: Jazmon Kos,LPN   Past Medical History:  Diagnosis Date  . Arthritis   . Cancer ([place])    skin ca  . GERD (gastroesophageal reflux disease)   . Hyperlipidemia   . Hypertension   . Seasonal allergies    Past Surgical History:  Procedure Laterality Date  . ABDOMINAL HYSTERECTOMY    . BLADDER  SUSPENSION    . [place], [place]  . CATARACT EXTRACTION    . CORONARY ANGIOPLASTY  2016  . CYSTOCELE REPAIR    . ESOPHAGOGASTRODUODENOSCOPY (EGD) WITH PROPOFOL N/A 03/12/2016   Procedure: ESOPHAGOGASTRODUODENOSCOPY (EGD) WITH PROPOFOL;  Surgeon: Jonathon Bellows, MD;  Location: [facility] ENDOSCOPY;  Service: Endoscopy;  Laterality: N/A;  . EYE SURGERY     cataract surgery  . rectal vaginal fistula repair    . TONSILLECTOMY    . VAGINAL HYSTERECTOMY     Family History  Problem Relation Age of Onset  . Hypertension Mother   . Heart failure Mother   . Hyperlipidemia Mother   . Stroke Mother   . Cancer Sister   . Heart disease Sister   . Hypertension Sister   . Heart disease Father   . Diabetes Brother   . Heart disease Brother        pacer/defiib  . Hypertension Brother   . Heart attack Brother   . Heart disease Brother        CABG  . Leukemia Daughter    Social History   Socioeconomic History  . Marital status: Widowed    Spouse name: Not on file  . Number of children: Not on file  . Years of education: Not on file  . Highest education level: Not on file  Occupational History  .  Not on file  Social Needs  . Financial resource strain: Not hard at all  . Food insecurity:    Worry: Never true    Inability: Never true  . Transportation needs:    Medical: No    Non-medical: No  Tobacco Use  . Smoking status: Former Smoker    Packs/day: 3.00    Years: 23.00    Pack years: 69.00    Types: Cigarettes    Last attempt to quit: 04/15/1978    Years since quitting: 39.5  . Smokeless tobacco: Never Used  Substance and Sexual Activity  . Alcohol use: No    Alcohol/week: 0.0 oz  . Drug use: No  . Sexual activity: Not on file  Lifestyle  . Physical activity:    Days per week: 0 days    Minutes per session: 0 min  . Stress: Not at all  Relationships  . Social connections:    Talks on phone: Not on file    Gets together: Not on file    Attends  religious service: Not on file    Active member of club or organization: Not on file    Attends meetings of clubs or organizations: Not on file    Relationship status: Widowed  Other Topics Concern  . Not on file  Social History Narrative   ** Merged History Encounter **        Outpatient Encounter Medications as of 10/27/2017  Medication Sig  . aspirin EC 81 MG tablet Take 81 mg by mouth daily.  . carvedilol (COREG) 25 MG tablet Take 1 tablet (25 mg total) by mouth 2 (two) times daily with a meal.  . cetirizine (ZYRTEC) 10 MG tablet Take 10 mg by mouth daily.  . Coenzyme Q10 (COQ10) 200 MG CAPS Take 1 capsule by mouth daily.   . fluticasone (FLONASE) 50 MCG/ACT nasal spray Place 2 sprays into both nostrils daily as needed.   . hydrochlorothiazide (HYDRODIURIL) 25 MG tablet Take 1 tablet (25 mg total) by mouth daily.  . hydrocortisone 2.5 % cream   . meclizine (ANTIVERT) 25 MG tablet Take 1 tablet (25 mg total) by mouth 3 (three) times daily as needed for dizziness.  . meloxicam (MOBIC) 7.5 MG tablet Take 1 tablet (7.5 mg total) by mouth daily.  . pantoprazole (PROTONIX) 40 MG tablet Take 40 mg by mouth daily.  . vitamin B-12 (CYANOCOBALAMIN) 500 MCG tablet Take 500 mcg by mouth daily.   No facility-administered encounter medications on file as of 10/27/2017.     Activities of Daily Living In your present state of health, do you have any difficulty performing the following activities: 10/27/2017 06/30/2017  Hearing? N N  Vision? Y N  Comment has floater -  Difficulty concentrating or making decisions? N N  Walking or climbing stairs? Y N  Comment back pain  -  Dressing or bathing? N N  Doing errands, shopping? N N  Preparing Food and eating ? N -  Using the Toilet? N -  In the past six months, have you accidently leaked urine? Y -  Comment depends at night  -  Do you have problems with loss of bowel control? N -  Managing your Medications? N -  Managing your Finances? N -    Housekeeping or managing your Housekeeping? N -  Some recent data might be hidden    Patient Care Team: Guadalupe Maple, MD as PCP - General (Family Medicine) Guadalupe Maple, MD (Family Medicine)  Guadalupe Maple, MD (Family Medicine) Rockey Situ Kathlene November, MD as Consulting Physician (Cardiology) [name], MD (Dermatology)    Assessment:   This is a routine wellness examination for [place].  Exercise Activities and Dietary recommendations Exercise limited by: None identified  Goals    . DIET - INCREASE WATER INTAKE     Recommend drinking at least 6-8 glasses of water a day        Fall Risk Fall Risk  10/27/2017 09/14/2017 02/17/2017 10/14/2016 07/22/2016  Falls in the past year? No No No No No   Is the patient's home free of loose throw rugs in walkways, pet beds, electrical cords, etc?   yes      Grab bars in the bathroom? no      Handrails on the stairs?   yes      Adequate lighting?   yes  Timed Get Up and Go performed: Completed in 8 seconds with no use of assistive devices, steady gait. No intervention needed at this time.   Depression Screen PHQ 2/9 Scores 10/27/2017 09/14/2017 02/17/2017 10/14/2016  PHQ - 2 Score 0 0 0 0     Cognitive Function     6CIT Screen 10/27/2017 10/14/2016  What Year? 0 points 0 points  What month? 0 points 0 points  What time? 0 points 0 points  Count back from 20 0 points 0 points  Months in reverse 0 points 0 points  Repeat phrase 0 points 0 points  Total Score 0 0    Immunization History  Administered Date(s) Administered  . Influenza, High Dose Seasonal PF 01/31/2016, 01/27/2017  . Influenza,inj,Quad PF,6+ Mos 01/15/2015  . Influenza-Unspecified 01/10/2014  . Pneumococcal Conjugate-13 09/13/2013  . Pneumococcal-Unspecified 03/30/2005    Qualifies for Shingles Vaccine?yes, discussed shingrix vaccine   Screening Tests Health Maintenance  Topic Date Due  . TETANUS/TDAP  09/15/2018 (Originally 10/27/1950)  . INFLUENZA  VACCINE  10/28/2017  . DEXA SCAN  Completed  . PNA vac Low Risk Adult  Completed    Cancer Screenings: Lung: Low Dose CT Chest recommended if Age 67-80 years, 30 pack-year currently smoking OR have quit w/in 15years. Patient does not qualify. Breast:  Up to date on Mammogram? Yes   Up to date of Bone Density/Dexa? Yes Colorectal: no longer required  Additional Screenings:  Hepatitis C Screening: not indicated     Plan:    I have personally reviewed and addressed the Medicare Annual Wellness questionnaire and have noted the following in the patient's chart:  A. Medical and social history B. Use of alcohol, tobacco or illicit drugs  C. Current medications and supplements D. Functional ability and status E.  Nutritional status F.  Physical activity G. Advance directives H. List of other physicians I.  Hospitalizations, surgeries, and ER visits in previous 12 months J.  [place] such as hearing and vision if needed, cognitive and depression L. Referrals and appointments   In addition, I have reviewed and discussed with patient certain preventive protocols, quality metrics, and best practice recommendations. A written personalized care plan for preventive services as well as general preventive health recommendations were provided to patient.   Signed,  Tyler Aas, LPN Nurse Health Advisor   Nurse Notes:none

## 2017-10-27 NOTE — Patient Instructions (Addendum)
Deborah Gibson , Thank you for taking time to come for your Medicare Wellness Visit. I appreciate your ongoing commitment to your health goals. Please review the following plan we discussed and let me know if I can assist you in the future.   Screening recommendations/referrals: Colonoscopy: no longer required Mammogram: no longer required Bone Density: completed  Recommended yearly ophthalmology/optometry visit for glaucoma screening and checkup  Recommended yearly dental visit for hygiene and checkup  Vaccinations: Influenza vaccine: due 11/2017 Pneumococcal vaccine: completed series  Tdap vaccine: due, check with your insurance company for coverage  Shingles vaccine: shingrix eligible, check with your insurance company for coverage   Advanced directives: Advance directive discussed with you today. Even though you declined this today please call our office should you change your mind and we can give you the proper paperwork for you to fill out.  Conditions/risks identified:  Recommend drinking at least 6-8 glasses of water a day   Next appointment: Follow up in one year for your annual wellness exam.    Preventive Care 65 Years and Older, Female Preventive care refers to lifestyle choices and visits with your health care provider that can promote health and wellness. What does preventive care include?  A yearly physical exam. This is also called an annual well check.  Dental exams once or twice a year.  Routine eye exams. Ask your health care provider how often you should have your eyes checked.  Personal lifestyle choices, including:  Daily care of your teeth and gums.  Regular physical activity.  Eating a healthy diet.  Avoiding tobacco and drug use.  Limiting alcohol use.  Practicing safe sex.  Taking low-dose aspirin every day.  Taking vitamin and mineral supplements as recommended by your health care provider. What happens during an annual well check? The services  and screenings done by your health care provider during your annual well check will depend on your age, overall health, lifestyle risk factors, and family history of disease. Counseling  Your health care provider may ask you questions about your:  Alcohol use.  Tobacco use.  Drug use.  Emotional well-being.  Home and relationship well-being.  Sexual activity.  Eating habits.  History of falls.  Memory and ability to understand (cognition).  Work and work Statistician.  Reproductive health. Screening  You may have the following tests or measurements:  Height, weight, and BMI.  Blood pressure.  Lipid and cholesterol levels. These may be checked every 5 years, or more frequently if you are over 22 years old.  Skin check.  Lung cancer screening. You may have this screening every year starting at age 31 if you have a 30-pack-year history of smoking and currently smoke or have quit within the past 15 years.  Fecal occult blood test (FOBT) of the stool. You may have this test every year starting at age 75.  Flexible sigmoidoscopy or colonoscopy. You may have a sigmoidoscopy every 5 years or a colonoscopy every 10 years starting at age 23.  Hepatitis C blood test.  Hepatitis B blood test.  Sexually transmitted disease (STD) testing.  Diabetes screening. This is done by checking your blood sugar (glucose) after you have not eaten for a while (fasting). You may have this done every 1-3 years.  Bone density scan. This is done to screen for osteoporosis. You may have this done starting at age 35.  Mammogram. This may be done every 1-2 years. Talk to your health care provider about how often you should  have regular mammograms. Talk with your health care provider about your test results, treatment options, and if necessary, the need for more tests. Vaccines  Your health care provider may recommend certain vaccines, such as:  Influenza vaccine. This is recommended every  year.  Tetanus, diphtheria, and acellular pertussis (Tdap, Td) vaccine. You may need a Td booster every 10 years.  Zoster vaccine. You may need this after age 64.  Pneumococcal 13-valent conjugate (PCV13) vaccine. One dose is recommended after age 7.  Pneumococcal polysaccharide (PPSV23) vaccine. One dose is recommended after age 67. Talk to your health care provider about which screenings and vaccines you need and how often you need them. This information is not intended to replace advice given to you by your health care provider. Make sure you discuss any questions you have with your health care provider. Document Released: 04/12/2015 Document Revised: 12/04/2015 Document Reviewed: 01/15/2015 Elsevier Interactive Patient Education  2017 [place] Prevention in the Home Falls can cause injuries. They can happen to people of all ages. There are many things you can do to make your home safe and to help prevent falls. What can I do on the outside of my home?  Regularly fix the edges of walkways and driveways and fix any cracks.  Remove anything that might make you trip as you walk through a door, such as a raised step or threshold.  Trim any bushes or trees on the path to your home.  Use bright outdoor lighting.  Clear any walking paths of anything that might make someone trip, such as rocks or tools.  Regularly check to see if handrails are loose or broken. Make sure that both sides of any steps have handrails.  Any raised decks and porches should have guardrails on the edges.  Have any leaves, snow, or ice cleared regularly.  Use sand or salt on walking paths during winter.  Clean up any spills in your garage right away. This includes oil or grease spills. What can I do in the bathroom?  Use night lights.  Install grab bars by the toilet and in the tub and shower. Do not use towel bars as grab bars.  Use non-skid mats or decals in the tub or shower.  If you  need to sit down in the shower, use a plastic, non-slip stool.  Keep the floor dry. Clean up any water that spills on the floor as soon as it happens.  Remove soap buildup in the tub or shower regularly.  Attach bath mats securely with double-sided non-slip rug tape.  Do not have throw rugs and other things on the floor that can make you trip. What can I do in the bedroom?  Use night lights.  Make sure that you have a light by your bed that is easy to reach.  Do not use any sheets or blankets that are too big for your bed. They should not hang down onto the floor.  Have a firm chair that has side arms. You can use this for support while you get dressed.  Do not have throw rugs and other things on the floor that can make you trip. What can I do in the kitchen?  Clean up any spills right away.  Avoid walking on wet floors.  Keep items that you use a lot in easy-to-reach places.  If you need to reach something above you, use a strong step stool that has a grab bar.  Keep electrical cords out of  the way.  Do not use floor polish or wax that makes floors slippery. If you must use wax, use non-skid floor wax.  Do not have throw rugs and other things on the floor that can make you trip. What can I do with my stairs?  Do not leave any items on the stairs.  Make sure that there are handrails on both sides of the stairs and use them. Fix handrails that are broken or loose. Make sure that handrails are as long as the stairways.  Check any carpeting to make sure that it is firmly attached to the stairs. Fix any carpet that is loose or worn.  Avoid having throw rugs at the top or bottom of the stairs. If you do have throw rugs, attach them to the floor with carpet tape.  Make sure that you have a light switch at the top of the stairs and the bottom of the stairs. If you do not have them, ask someone to add them for you. What else can I do to help prevent falls?  Wear shoes  that:  Do not have high heels.  Have rubber bottoms.  Are comfortable and fit you well.  Are closed at the toe. Do not wear sandals.  If you use a stepladder:  Make sure that it is fully opened. Do not climb a closed stepladder.  Make sure that both sides of the stepladder are locked into place.  Ask someone to hold it for you, if possible.  Clearly mark and make sure that you can see:  Any grab bars or handrails.  First and last steps.  Where the edge of each step is.  Use tools that help you move around (mobility aids) if they are needed. These include:  Canes.  Walkers.  Scooters.  Crutches.  Turn on the lights when you go into a dark area. Replace any light bulbs as soon as they burn out.  Set up your furniture so you have a clear path. Avoid moving your furniture around.  If any of your floors are uneven, fix them.  If there are any pets around you, be aware of where they are.  Review your medicines with your doctor. Some medicines can make you feel dizzy. This can increase your chance of falling. Ask your doctor what other things that you can do to help prevent falls. This information is not intended to replace advice given to you by your health care provider. Make sure you discuss any questions you have with your health care provider. Document Released: 01/10/2009 Document Revised: 08/22/2015 Document Reviewed: 04/20/2014 Elsevier Interactive Patient Education  2017 [employer].

## 2017-11-09 ENCOUNTER — Ambulatory Visit: Payer: Medicare HMO | Admitting: Vascular Surgery

## 2017-11-09 ENCOUNTER — Other Ambulatory Visit: Payer: Self-pay

## 2017-11-09 ENCOUNTER — Ambulatory Visit (HOSPITAL_COMMUNITY)
Admission: RE | Admit: 2017-11-09 | Discharge: 2017-11-09 | Disposition: A | Discharge status: Home / Self Care | Payer: Medicare HMO | Source: Ambulatory Visit | Attending: Vascular Surgery | Admitting: Vascular Surgery

## 2017-11-09 ENCOUNTER — Encounter: Payer: Self-pay | Admitting: Vascular Surgery

## 2017-11-09 VITALS — BP 150/61 | HR 60 | Temp 97.6°F | Resp 16 | Ht 65.5 in | Wt 179.0 lb

## 2017-11-09 DIAGNOSIS — I739 Peripheral vascular disease, unspecified: Secondary | ICD-10-CM

## 2017-11-09 DIAGNOSIS — M79604 Pain in right leg: Secondary | ICD-10-CM

## 2017-11-09 DIAGNOSIS — M79605 Pain in left leg: Secondary | ICD-10-CM

## 2017-11-09 NOTE — Progress Notes (Signed)
[facility] [place]  Patient name: Deborah Gibson MRN: [id number] DOB: [birth year] Sex: female  REASON FOR CONSULT: Bilateral lower extremity claudication symptoms  HPI: Deborah Gibson is a 84 y.o. female, who is seen today for discussion of bilateral lower semi-claudication symptoms.  She remains extremely active.  She reports discomfort in her hips and buttocks extending down both legs when she walks.  She reports that this is worse when she is walking uphill or if she is trying to walk fast.  She does not have any rest pain and does not have any history of tissue loss.  She has no prior history of stroke or cardiac disease.  He does report some chest pressure which can be with exertion.  She has had evaluation of this to include cardiac catheterization and was told that she has no evidence of cardiac disease  Past Medical History:  Diagnosis Date  . Arthritis   . Cancer ([place])    skin ca  . GERD (gastroesophageal reflux disease)   . Hyperlipidemia   . Hypertension   . Seasonal allergies     Family History  Problem Relation Age of Onset  . Hypertension Mother   . Heart failure Mother   . Hyperlipidemia Mother   . Stroke Mother   . Cancer Sister   . Heart disease Sister   . Hypertension Sister   . Heart disease Father   . Diabetes Brother   . Heart disease Brother        pacer/defiib  . Hypertension Brother   . Heart attack Brother   . Heart disease Brother        CABG  . Leukemia Daughter     SOCIAL HISTORY: Social History   Socioeconomic History  . Marital status: Widowed    Spouse name: Not on file  . Number of children: Not on file  . Years of education: Not on file  . Highest education level: Not on file  Occupational History  . Not on file  Social Needs  . Financial resource strain: Not hard at all  . Food insecurity:    Worry: Never true    Inability: Never true  . Transportation needs:   Medical: No    Non-medical: No  Tobacco Use  . Smoking status: Former Smoker    Packs/day: 3.00    Years: 23.00    Pack years: 69.00    Types: Cigarettes    Last attempt to quit: 04/15/1978    Years since quitting: 39.5  . Smokeless tobacco: Never Used  Substance and Sexual Activity  . Alcohol use: No    Alcohol/week: 0.0 standard drinks  . Drug use: No  . Sexual activity: Not on file  Lifestyle  . Physical activity:    Days per week: 0 days    Minutes per session: 0 min  . Stress: Not at all  Relationships  . Social connections:    Talks on phone: Not on file    Gets together: Not on file    Attends religious service: Not on file    Active member of club or organization: Not on file    Attends meetings of clubs or organizations: Not on file    Relationship status: Widowed  . Intimate partner violence:    Fear of current or ex partner: No    Emotionally abused: No    Physically abused: No    Forced sexual activity: No  Other Topics Concern  .  Not on file  Social History Narrative   ** Merged History Encounter **        Allergies  Allergen Reactions  . Scopolamine Anaphylaxis  . Amlodipine   . Amlodipine Swelling  . Benazepril Cough  . Codeine     Sick on stomach   . Crestor [Rosuvastatin] Other (See Comments)    myalgias  . Lovastatin Other (See Comments)    Myalgias   . Scopolamine   . Statins     Legs ache   . Zocor [Simvastatin] Other (See Comments)    myalgias    Current Outpatient Medications  Medication Sig Dispense Refill  . aspirin EC 81 MG tablet Take 81 mg by mouth daily.    . carvedilol (COREG) 25 MG tablet Take 1 tablet (25 mg total) by mouth 2 (two) times daily with a meal. 180 tablet 4  . cetirizine (ZYRTEC) 10 MG tablet Take 10 mg by mouth daily.    . Coenzyme Q10 (COQ10) 200 MG CAPS Take 1 capsule by mouth daily.     . fluticasone (FLONASE) 50 MCG/ACT nasal spray Place 2 sprays into both nostrils daily as needed.     .  hydrochlorothiazide (HYDRODIURIL) 25 MG tablet Take 1 tablet (25 mg total) by mouth daily. 90 tablet 4  . hydrocortisone 2.5 % cream     . meclizine (ANTIVERT) 25 MG tablet Take 1 tablet (25 mg total) by mouth 3 (three) times daily as needed for dizziness. 20 tablet 0  . meloxicam (MOBIC) 7.5 MG tablet Take 1 tablet (7.5 mg total) by mouth daily. 30 tablet 3  . pantoprazole (PROTONIX) 40 MG tablet Take 40 mg by mouth daily.    . vitamin B-12 (CYANOCOBALAMIN) 500 MCG tablet Take 500 mcg by mouth daily.     No current facility-administered medications for this visit.     REVIEW OF SYSTEMS:  [X]  denotes positive finding, [ ]  denotes negative finding Cardiac  Comments:  Chest pain or chest pressure: x   Shortness of breath upon exertion: x   Short of breath when lying flat: x   Irregular heart rhythm:        Vascular    Pain in calf, thigh, or hip brought on by ambulation: x   Pain in feet at night that wakes you up from your sleep:     Blood clot in your veins:    Leg swelling:         Pulmonary    Oxygen at home:    Productive cough:     Wheezing:         Neurologic    Sudden weakness in arms or legs:     Sudden numbness in arms or legs:  x   Sudden onset of difficulty speaking or slurred speech:    Temporary loss of vision in one eye:     Problems with dizziness:  x       Gastrointestinal    Blood in stool:     Vomited blood:         Genitourinary    Burning when urinating:     Blood in urine:        Psychiatric    Major depression:         Hematologic    Bleeding problems:    Problems with blood clotting too easily:        Skin    Rashes or ulcers: x       Constitutional  Fever or chills:      PHYSICAL EXAM: Vitals:   11/09/17 0927  BP: (!) 150/61  Pulse: 60  Resp: 16  Temp: 97.6 F (36.4 C)  TempSrc: Oral  SpO2: 95%  Weight: 179 lb (81.2 kg)  Height: 5' 5.5" (1.664 m)    GENERAL: The patient is a well-nourished female, in no acute distress.  The vital signs are documented above. CARDIOVASCULAR: Carotid arteries without bruits bilaterally.  Radial pulses 2+.  1-2+ femoral pulses and 1-2+ dorsalis pedis pulses bilaterally PULMONARY: There is good air exchange  ABDOMEN: Soft and non-tender  MUSCULOSKELETAL: There are no major deformities or cyanosis. NEUROLOGIC: No focal weakness or paresthesias are detected. SKIN: There are no ulcers or rashes noted. PSYCHIATRIC: The patient has a normal affect.  DATA:  Noninvasive studies suggest an ankle arm index of 0.84 on the right and 0.78 on the left with monophasic waveforms at the tibial level.  MEDICAL ISSUES: Had a long discussion with the patient.  I did explain that there could be some overlap between symptoms associated with degenerative disc disease and vascular claudication.  Her symptoms are classic for vascular claudication.  With palpable pedal pulses and with her symptoms this is most likely related to aortoiliac occlusive disease.  I explained that that if this is the case that she in all likelihood could be treated with endovascular means.  I have recommended a CT angiogram with runoff for further evaluation.  If this does show symptomatic aortoiliac occlusive disease, would then proceed with arteriography angioplasty for treatment of this.  We will notify her following obtaining CT scan   Rosetta Posner, MD [facility] Tel [phone] Pager [phone]

## 2017-11-23 ENCOUNTER — Ambulatory Visit
Admission: RE | Admit: 2017-11-23 | Discharge: 2017-11-23 | Disposition: A | Discharge status: Home / Self Care | Payer: Medicare HMO | Source: Ambulatory Visit | Attending: Vascular Surgery | Admitting: Vascular Surgery

## 2017-11-23 DIAGNOSIS — I739 Peripheral vascular disease, unspecified: Secondary | ICD-10-CM

## 2017-11-23 DIAGNOSIS — M79605 Pain in left leg: Secondary | ICD-10-CM

## 2017-11-23 DIAGNOSIS — M79604 Pain in right leg: Secondary | ICD-10-CM

## 2017-11-23 MED ORDER — IOPAMIDOL (ISOVUE-370) INJECTION 76%
125.0000 mL | Freq: Once | INTRAVENOUS | Status: AC | PRN
Start: 2017-11-23 — End: 2017-11-23
  Administered 2017-11-23: 125 mL via INTRAVENOUS

## 2017-12-14 ENCOUNTER — Telehealth: Payer: Self-pay | Admitting: Vascular Surgery

## 2017-12-14 NOTE — Telephone Encounter (Signed)
I discussed the CT angiogram and lower extremity runoff from 11/23/2017 with the patient.  This shows no evidence of significant aortoiliac occlusive disease.  She does have moderate SFA disease bilaterally.  I explained that this would not cause hip or buttock claudication and could cause mild calf claudication.  She does report some resting Symptoms as well and I explained that this could not come with this degree of stenosis.  I suspect that her hip and thigh discomfort is related to degenerative disc disease or orthopedic issues.  Would not recommend any further evaluation.  She was comfortable with this discussion will see [place] on an as-needed basis

## 2018-01-12 ENCOUNTER — Other Ambulatory Visit: Payer: Self-pay | Admitting: Family Medicine

## 2018-01-12 NOTE — Telephone Encounter (Signed)
Requested Prescriptions  Pending Prescriptions Disp Refills  . meloxicam (MOBIC) 7.5 MG tablet [Pharmacy Med Name: MELOXICAM 7.5 MG TABLET] 30 tablet 3    Sig: TAKE 1 TABLET BY MOUTH EVERY DAY     Analgesics:  COX2 Inhibitors Passed - 01/12/2018  1:49 AM      Passed - HGB in normal range and within 360 days    Hemoglobin  Date Value Ref Range Status  07/22/2017 13.6 12.0 - 16.0 g/dL Final  07/06/2017 13.6 11.1 - 15.9 g/dL Final         Passed - Cr in normal range and within 360 days    Creatinine  Date Value Ref Range Status  05/01/2014 0.87 0.60 - 1.30 mg/dL Final   Creatinine, Ser  Date Value Ref Range Status  09/14/2017 0.77 0.57 - 1.00 mg/dL Final         Passed - Patient is not pregnant      Passed - Valid encounter within last 12 months    Recent Outpatient Visits          4 months ago Essential hypertension   [facility] Guadalupe Maple, MD   6 months ago [facility], [place], [place]   10 months ago Essential hypertension   [facility], Jeannette How, MD   1 year ago Essential hypertension   [facility], Jeannette How, MD   1 year ago Essential hypertension   [place], Jeannette How, MD      Future Appointments            In 2 months [facility], Jeannette How, MD [facility], [facility]

## 2018-01-13 ENCOUNTER — Ambulatory Visit (INDEPENDENT_AMBULATORY_CARE_PROVIDER_SITE_OTHER): Payer: Medicare HMO

## 2018-01-13 DIAGNOSIS — Z23 Encounter for immunization: Secondary | ICD-10-CM | POA: Diagnosis not present

## 2018-01-13 NOTE — Patient Instructions (Signed)

## 2018-03-03 ENCOUNTER — Encounter: Payer: Self-pay | Admitting: Family Medicine

## 2018-03-05 IMAGING — CT CT ABD-PELV W/ CM
2 of 5 series · 16 of 46 positions shown, 18 images · IV contrast (iopamidol)
Comparison: None.

CLINICAL DATA: Midsternal pain radiating to the back into the
abdomen since yesterday.

EXAM:
CT ABDOMEN AND PELVIS WITH CONTRAST
TECHNIQUE: Multidetector CT imaging of the abdomen and pelvis was performed
using the standard protocol following bolus administration of
intravenous contrast.
CONTRAST:  100mL T8VA4S-NNN IOPAMIDOL (T8VA4S-NNN) INJECTION 61%

[Series 2: axial st · axial · 0.81mm/px · z∈[-1064,-629]mm · 13 of 99 slices shown, 15 images]
[im 6/99  soft-tissue]
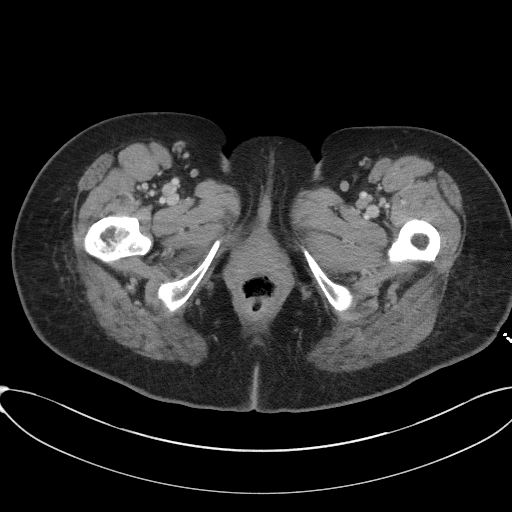
[im 6/99  bone]
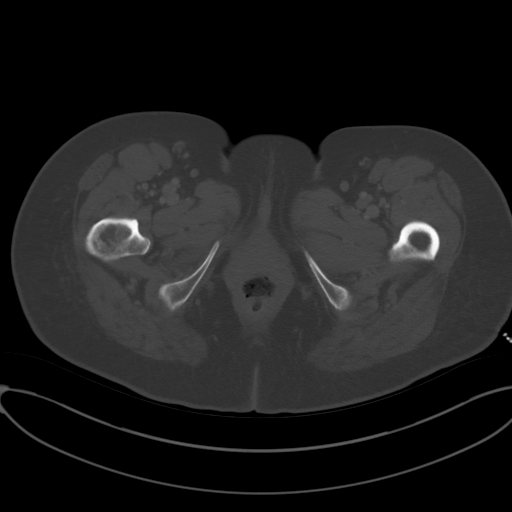
[im 12/99  soft-tissue]
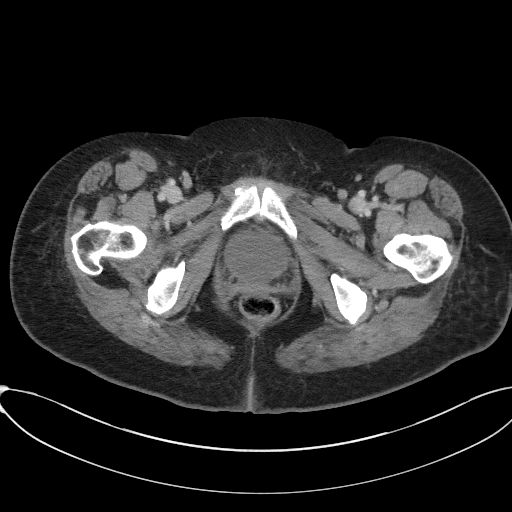
[im 24/99  soft-tissue]
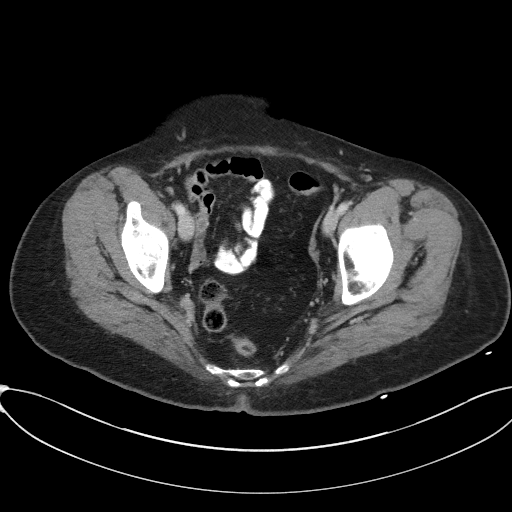
[im 29/99  soft-tissue]
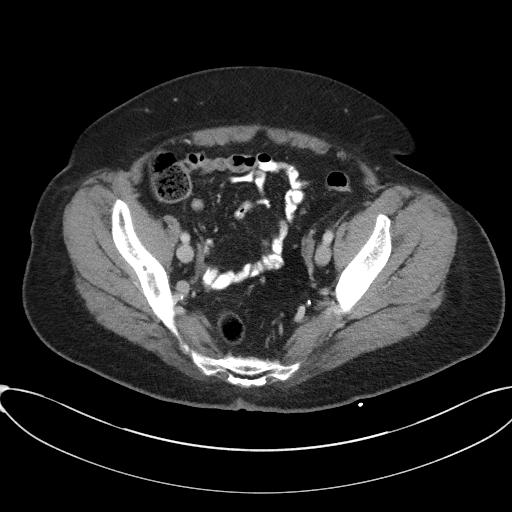
[im 35/99  soft-tissue]
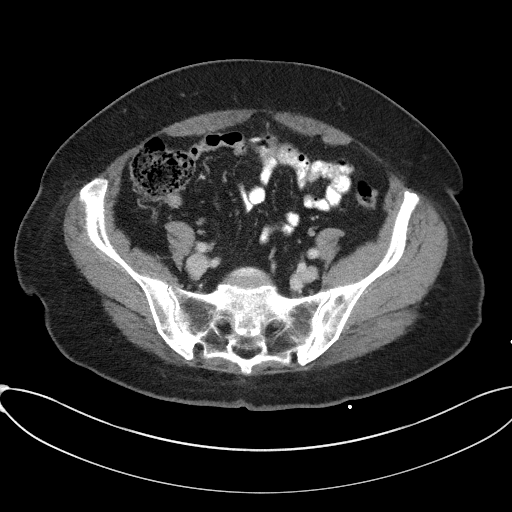
[im 41/99  soft-tissue]
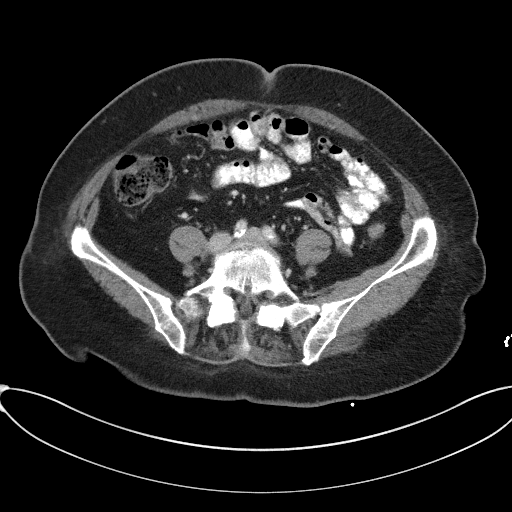
[im 52/99  soft-tissue]
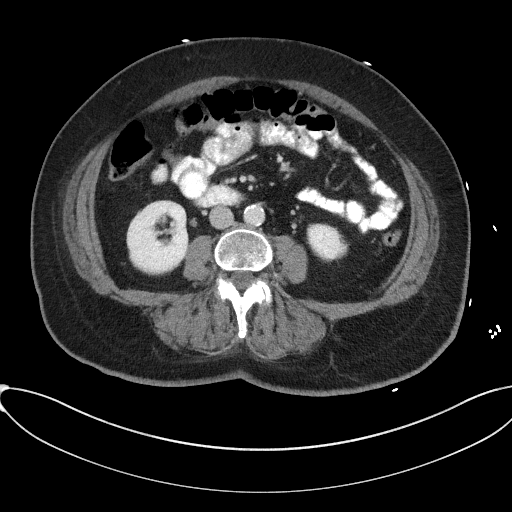
[im 58/99  soft-tissue]
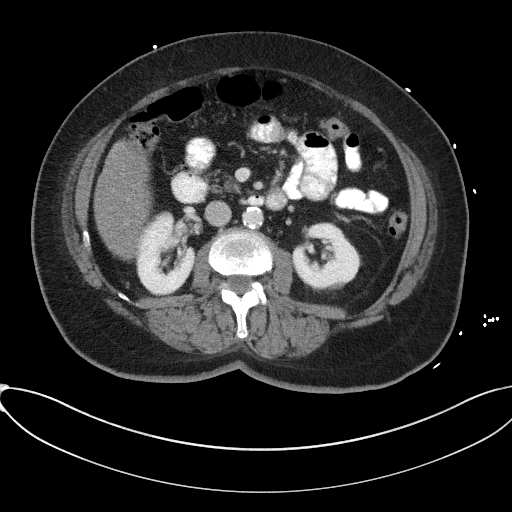
[im 64/99  soft-tissue]
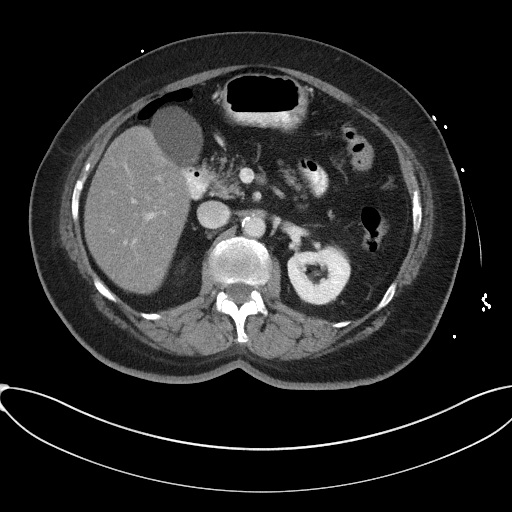
[im 64/99  bone]
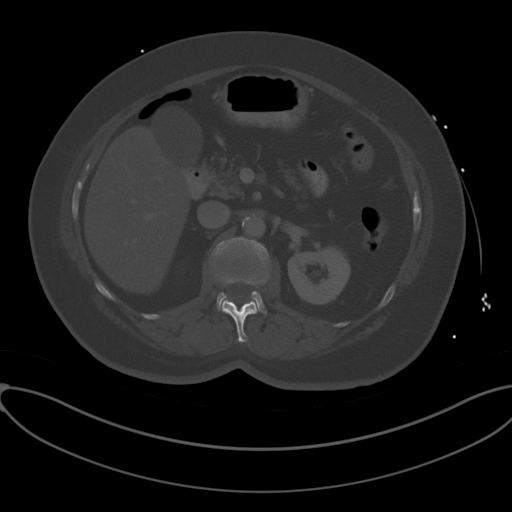
[im 70/99  soft-tissue]
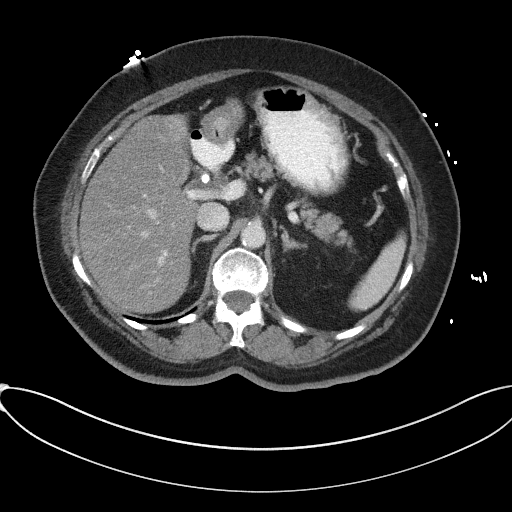
[im 75/99  soft-tissue]
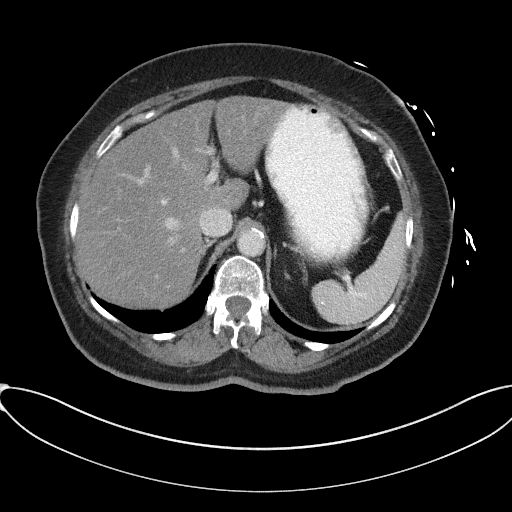
[im 87/99  soft-tissue]
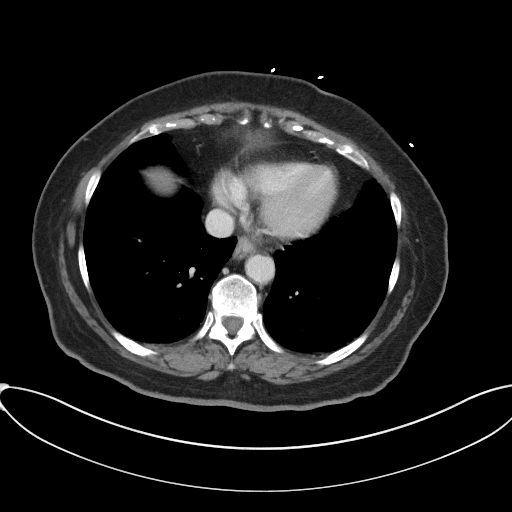
[im 93/99  soft-tissue]
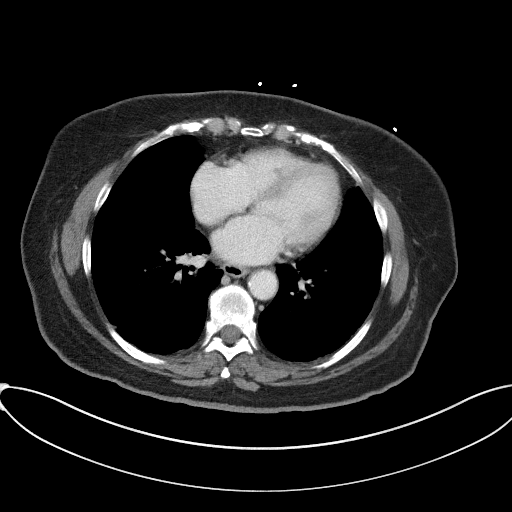

[Series 5: coronal st · coronal · 0.83mm/px · 3 of 96 slices shown]
[im 32/96  soft-tissue]
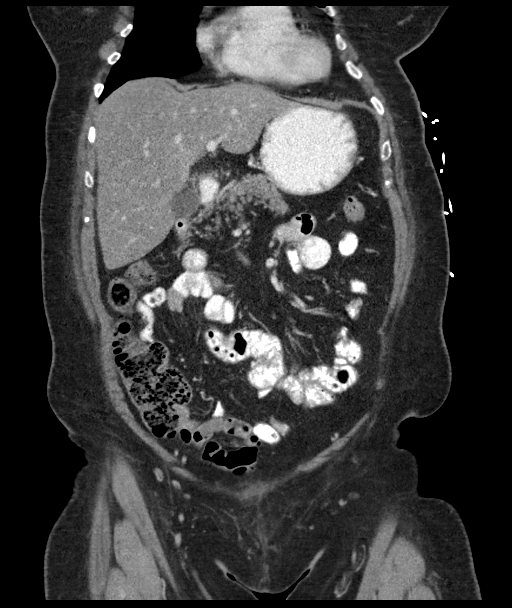
[im 43/96  soft-tissue]
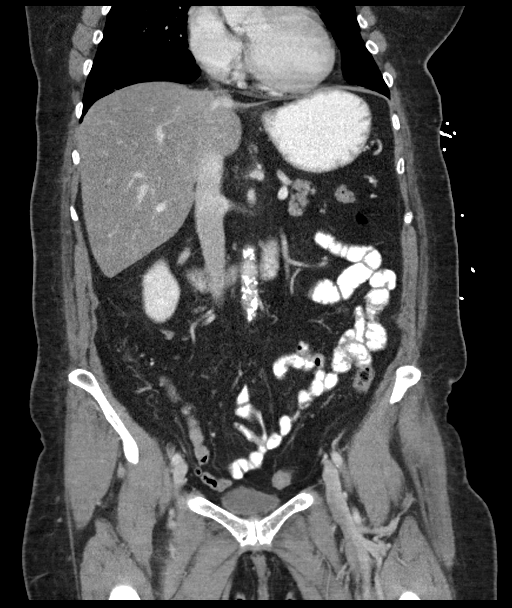
[im 53/96  soft-tissue]
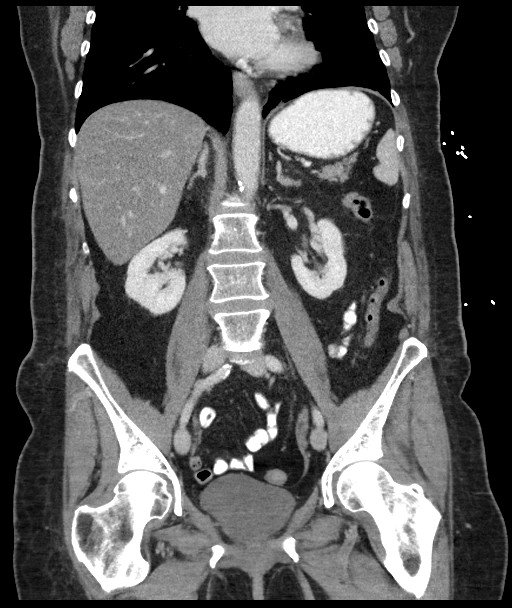

[16 of 46 positions shown; findings below may reference images not displayed]

FINDINGS: Lower chest: Mild dependent changes in the lung bases. Minimal
esophageal hiatal hernia with mild thickening of the lower
esophageal wall possibly indicating reflux disease.

Hepatobiliary: Diffuse fatty infiltration of the liver. No focal
liver lesions. Stone in the gallbladder neck. No gallbladder wall
thickening or edema. Bile ducts are not dilated.

Pancreas: Unremarkable. No pancreatic ductal dilatation or
surrounding inflammatory changes.

Spleen: Calcified granulomas.  No splenomegaly.

Adrenals/Urinary Tract: Adrenal glands are unremarkable. Kidneys are
normal, without renal calculi, focal solid lesion, or
hydronephrosis. Bladder is unremarkable.

Stomach/Bowel: Stomach is within normal limits. Appendix appears
normal. No evidence of bowel wall thickening, distention, or
inflammatory changes.

Vascular/Lymphatic: Aortic atherosclerosis. No enlarged abdominal or
pelvic lymph nodes.

Reproductive: Status post hysterectomy. No adnexal masses.

Other: No abdominal wall hernia or abnormality. No abdominopelvic
ascites.

Musculoskeletal: Degenerative changes in the lumbar spine. No
destructive bone lesions.
IMPRESSION: No acute process demonstrated in the abdomen or pelvis.
Cholelithiasis without evidence of inflammation or ductal
dilatation. Fatty infiltration of the liver. Thickening of the
distal esophageal wall could indicate reflux disease.

## 2018-03-05 IMAGING — CR DG CHEST 2V
2 series · 2 of 2 positions shown · non-contrast
Comparison: Chest radiograph 05/01/2014

CLINICAL DATA: Midsternal pain

EXAM:
CHEST  2 VIEW

[chest pa]
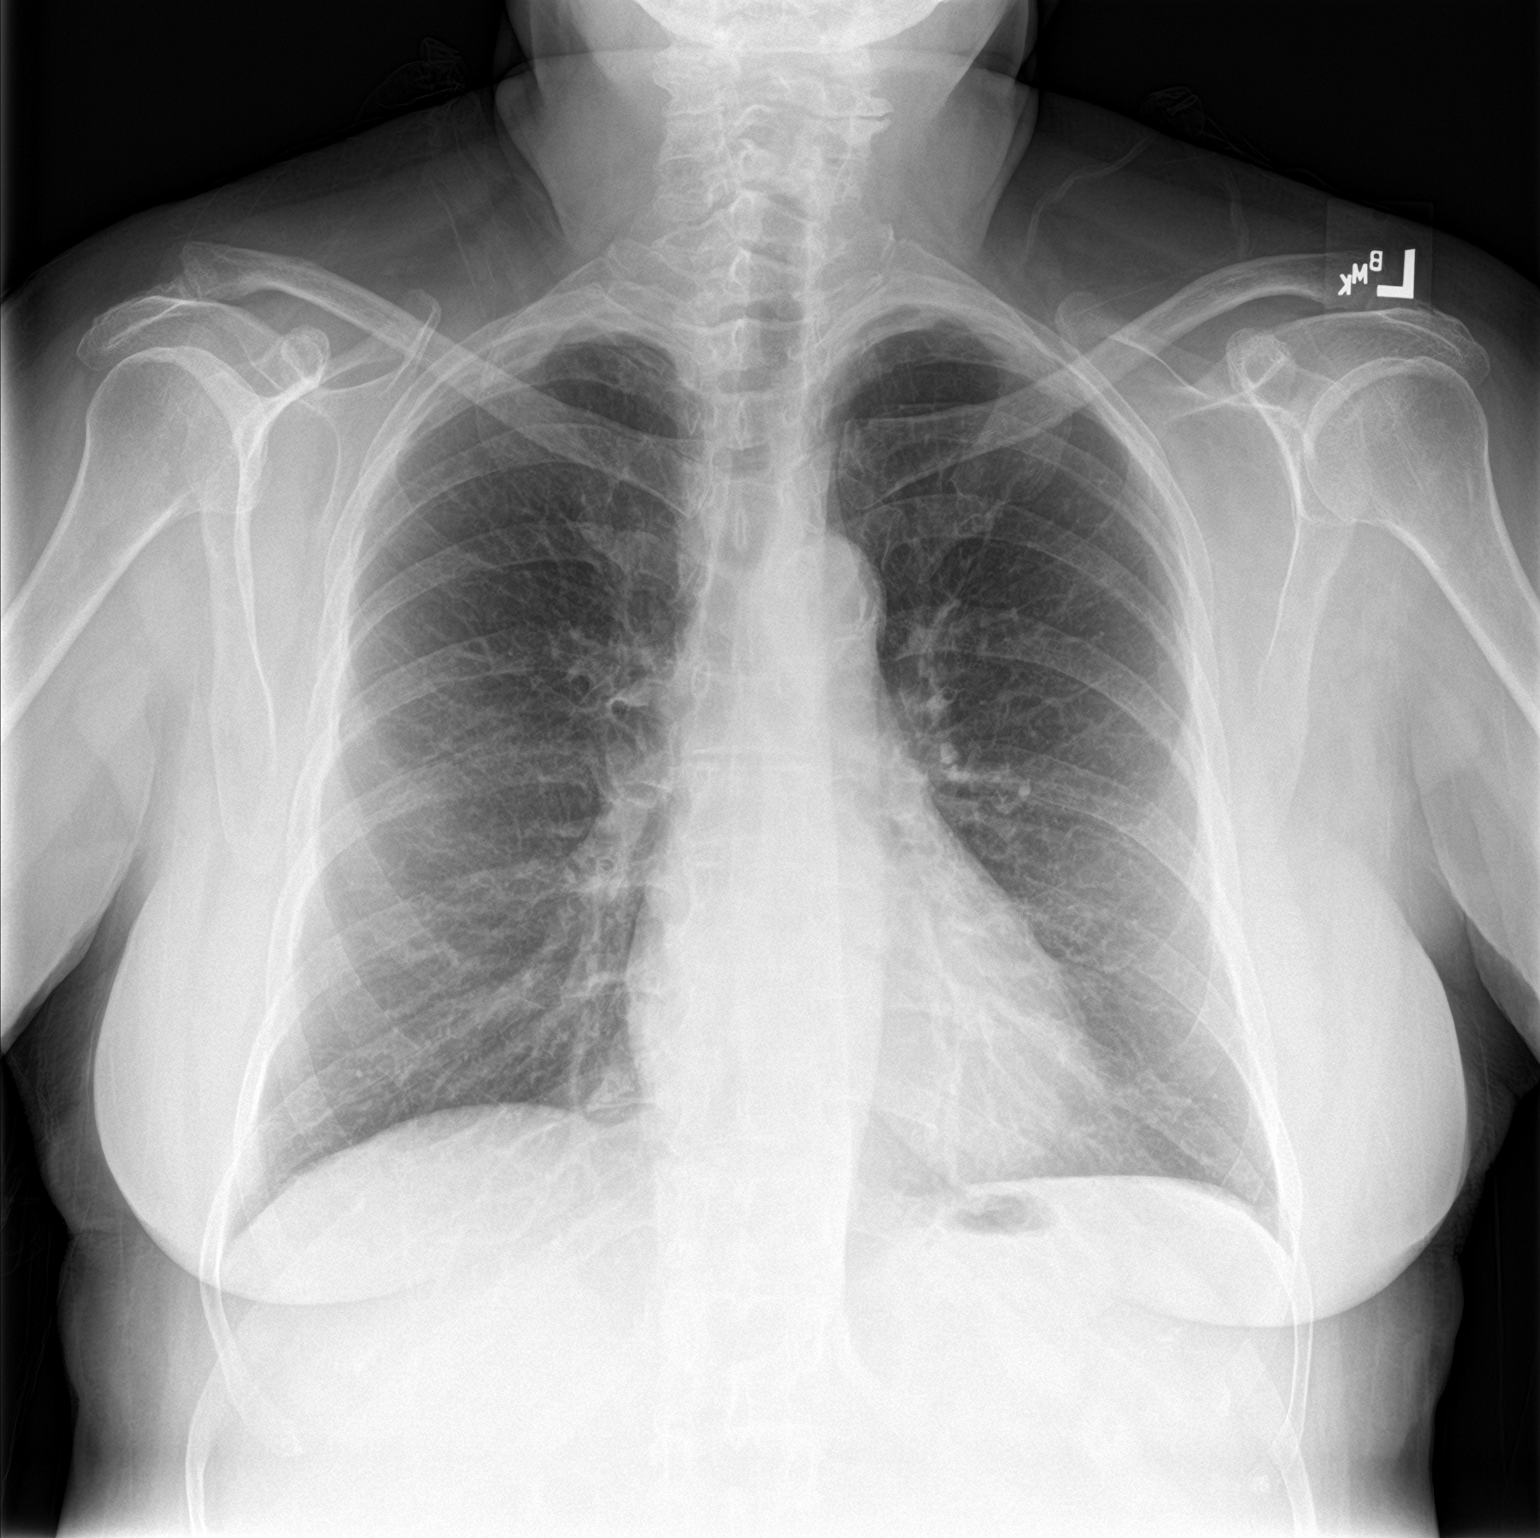

[chest lat]
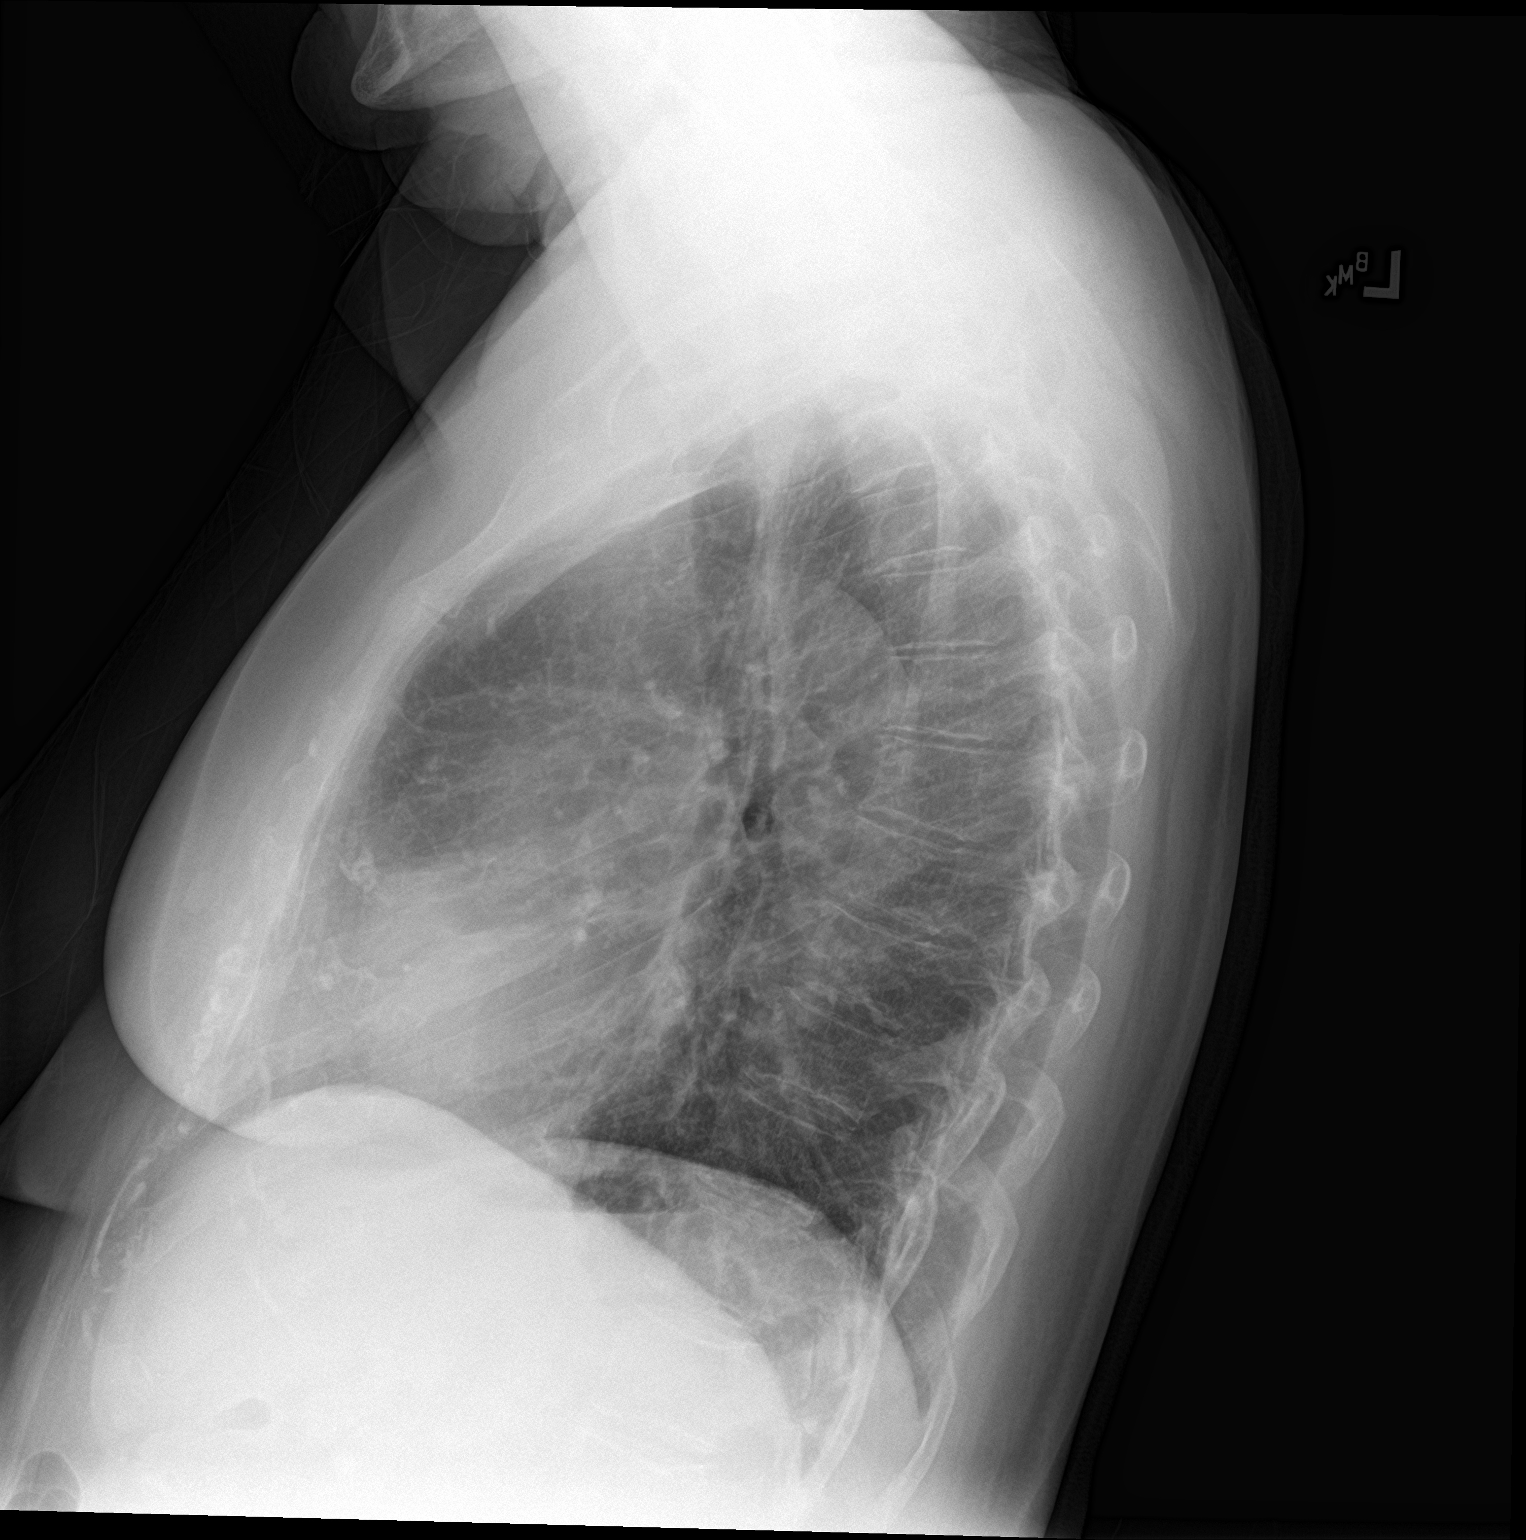

[2 of 2 positions shown; findings below may reference images not displayed]

FINDINGS: The lungs are well inflated. There is atherosclerotic calcification
within the aortic arch. Otherwise, the cardiomediastinal contours
are normal. There are diffusely increased pulmonary markings,
unchanged. No focal airspace consolidation or pulmonary edema. No
pleural effusion or pneumothorax.
IMPRESSION: No active cardiopulmonary disease.

Aortic atherosclerosis.

## 2018-04-05 ENCOUNTER — Encounter: Payer: Medicare HMO | Admitting: Family Medicine

## 2018-04-12 ENCOUNTER — Other Ambulatory Visit: Payer: Self-pay | Admitting: Family Medicine

## 2018-04-12 DIAGNOSIS — I10 Essential (primary) hypertension: Secondary | ICD-10-CM

## 2018-04-12 NOTE — Telephone Encounter (Signed)
Requested Prescriptions  Pending Prescriptions Disp Refills  . hydrochlorothiazide (HYDRODIURIL) 25 MG tablet [Pharmacy Med Name: HYDROCHLOROTHIAZIDE 25 MG TAB] 90 tablet 0    Sig: TAKE 1 TABLET BY MOUTH EVERY DAY     Cardiovascular: Diuretics - Thiazide Failed - 04/12/2018  2:01 AM      Failed - Last BP in normal range    BP Readings from Last 1 Encounters:  11/09/17 (!) 150/61         Passed - Ca in normal range and within 360 days    Calcium  Date Value Ref Range Status  09/14/2017 9.4 8.7 - 10.3 mg/dL Final   Calcium, Total  Date Value Ref Range Status  05/01/2014 9.9 8.5 - 10.1 mg/dL Final         Passed - Cr in normal range and within 360 days    Creatinine  Date Value Ref Range Status  05/01/2014 0.87 0.60 - 1.30 mg/dL Final   Creatinine, Ser  Date Value Ref Range Status  09/14/2017 0.77 0.57 - 1.00 mg/dL Final         Passed - K in normal range and within 360 days    Potassium  Date Value Ref Range Status  09/14/2017 3.9 3.5 - 5.2 mmol/L Final  05/01/2014 4.0 3.5 - 5.1 mmol/L Final         Passed - Na in normal range and within 360 days    Sodium  Date Value Ref Range Status  09/14/2017 140 134 - 144 mmol/L Final  05/01/2014 142 136 - 145 mmol/L Final         Passed - Valid encounter within last 6 months    Recent Outpatient Visits          7 months ago Essential hypertension   [facility] Guadalupe Maple, MD   9 months ago [place], Lilia [place], [place]   1 year ago Essential hypertension   [facility] Crissman, Jeannette How, MD   1 year ago Essential hypertension   [facility] Cr[facility], Jeannette How, [place]   1 year ago Essential hypertension   [place], Jeannette How, MD      Future Appointments            In 2 days Crissman, Jeannette How, MD [facility], [facility]

## 2018-04-14 ENCOUNTER — Ambulatory Visit (INDEPENDENT_AMBULATORY_CARE_PROVIDER_SITE_OTHER): Payer: Medicare HMO | Admitting: Family Medicine

## 2018-04-14 ENCOUNTER — Encounter: Payer: Self-pay | Admitting: Family Medicine

## 2018-04-14 VITALS — BP 145/64 | HR 62 | Temp 98.1°F | Ht 64.96 in | Wt 179.1 lb

## 2018-04-14 DIAGNOSIS — I739 Peripheral vascular disease, unspecified: Secondary | ICD-10-CM | POA: Diagnosis not present

## 2018-04-14 DIAGNOSIS — Z7189 Other specified counseling: Secondary | ICD-10-CM | POA: Diagnosis not present

## 2018-04-14 DIAGNOSIS — Z1329 Encounter for screening for other suspected endocrine disorder: Secondary | ICD-10-CM

## 2018-04-14 DIAGNOSIS — E785 Hyperlipidemia, unspecified: Secondary | ICD-10-CM

## 2018-04-14 DIAGNOSIS — K219 Gastro-esophageal reflux disease without esophagitis: Secondary | ICD-10-CM

## 2018-04-14 DIAGNOSIS — E876 Hypokalemia: Secondary | ICD-10-CM | POA: Diagnosis not present

## 2018-04-14 DIAGNOSIS — I10 Essential (primary) hypertension: Secondary | ICD-10-CM | POA: Diagnosis not present

## 2018-04-14 LAB — MICROSCOPIC EXAMINATION

## 2018-04-14 LAB — URINALYSIS, ROUTINE W REFLEX MICROSCOPIC
Bilirubin, UA: NEGATIVE
Glucose, UA: NEGATIVE
Ketones, UA: NEGATIVE
Nitrite, UA: NEGATIVE
Protein, UA: NEGATIVE
RBC, UA: NEGATIVE
Specific Gravity, UA: 1.015 (ref 1.005–1.030)
Urobilinogen, Ur: 0.2 mg/dL (ref 0.2–1.0)
pH, UA: 5.5 (ref 5.0–7.5)

## 2018-04-14 MED ORDER — HYDROCHLOROTHIAZIDE 25 MG PO TABS: 25.0000 mg | ORAL_TABLET | Freq: Every day | ORAL | 4 refills | Status: DC

## 2018-04-14 MED ORDER — CARVEDILOL 25 MG PO TABS: 25.0000 mg | ORAL_TABLET | Freq: Two times a day (BID) | ORAL | 4 refills | Status: DC

## 2018-04-14 MED ORDER — MELOXICAM 7.5 MG PO TABS: 7.5000 mg | ORAL_TABLET | Freq: Every day | ORAL | 3 refills | Status: DC

## 2018-04-14 NOTE — Assessment & Plan Note (Signed)
The current medical regimen is effective;  continue present plan and medications.  

## 2018-04-14 NOTE — Assessment & Plan Note (Signed)
A voluntary discussion about advanced care planning including explanation and discussion of advanced directives was extentively discussed with the patient.  Explained about the healthcare proxy and living will was reviewed and packet with forms with expiration of how to fill them out was given.  Time spent: Encounter 16+ min individuals present: Patient 

## 2018-04-14 NOTE — Progress Notes (Signed)
BP (!) 145/64 (BP Location: Left Arm, Patient Position: Sitting, Cuff Size: Large)   Pulse 62   Temp 98.1 F (36.7 C)   Ht 5' 4.96" (1.65 m)   Wt 179 lb 2 oz (81.3 kg)   SpO2 98%   BMI 29.84 kg/m    Subjective:    Patient ID: Deborah Gibson, female    DOB: April 16, 1931, 85 y.o.   MRN: [id number]  HPI: Deborah Gibson is a 85 y.o. female  Chief Complaint  Patient presents with  . Annual Exam  Patient continues to be bothered by arthritis in her right hip. Also leg claudication stable not getting any worse no surgical treatment is recommended will continue medical care.  Relevant past medical, surgical, family and social history reviewed and updated as indicated. Interim medical history since our last visit reviewed. Allergies and medications reviewed and updated.  Review of Systems  Constitutional: Negative.   HENT: Negative.   Eyes: Negative.   Respiratory: Negative.   Cardiovascular: Negative.   Gastrointestinal: Negative.   Endocrine: Negative.   Genitourinary: Negative.   Musculoskeletal: Negative.   Skin: Negative.   Allergic/Immunologic: Negative.   Neurological: Negative.   Hematological: Negative.   Psychiatric/Behavioral: Negative.     Per HPI unless specifically indicated above     Objective:    BP (!) 145/64 (BP Location: Left Arm, Patient Position: Sitting, Cuff Size: Large)   Pulse 62   Temp 98.1 F (36.7 C)   Ht 5' 4.96" (1.65 m)   Wt 179 lb 2 oz (81.3 kg)   SpO2 98%   BMI 29.84 kg/m   Wt Readings from Last 3 Encounters:  04/14/18 179 lb 2 oz (81.3 kg)  11/09/17 179 lb (81.2 kg)  10/27/17 179 lb (81.2 kg)    Physical Exam Constitutional:      Appearance: She is well-developed.  HENT:     Head: Normocephalic and atraumatic.     Right Ear: External ear normal.     Left Ear: External ear normal.     Nose: Nose normal.  Eyes:     Conjunctiva/sclera: Conjunctivae normal.     Pupils: Pupils are equal, round, and reactive to light.    Neck:     Musculoskeletal: Normal range of motion and neck supple.     Vascular: No carotid bruit.  Cardiovascular:     Rate and Rhythm: Normal rate and regular rhythm.     Heart sounds: Normal heart sounds. No murmur.  Pulmonary:     Effort: Pulmonary effort is normal.     Breath sounds: Normal breath sounds.  Chest:     Chest wall: No mass.     Breasts: Breasts are symmetrical.        Right: No mass, skin change or tenderness.        Left: No mass, skin change or tenderness.  Abdominal:     General: Bowel sounds are normal.     Palpations: Abdomen is soft.  Musculoskeletal: Normal range of motion.  Skin:    Findings: No rash.  Neurological:     Mental Status: She is alert and oriented to person, place, and time.  Psychiatric:        Behavior: Behavior normal.        Thought Content: Thought content normal.        Judgment: Judgment normal.     Results for orders placed or performed in visit on 00/34/91  Basic metabolic panel  Result Value Ref  Range   Glucose 97 65 - 99 mg/dL   BUN 12 8 - 27 mg/dL   Creatinine, Ser 0.77 0.57 - 1.00 mg/dL   GFR calc non Af Amer 72 >59 mL/min/1.73   GFR calc Af Amer 83 >59 mL/min/1.73   BUN/Creatinine Ratio 16 12 - 28   Sodium 140 134 - 144 mmol/L   Potassium 3.9 3.5 - 5.2 mmol/L   Chloride 101 96 - 106 mmol/L   CO2 24 20 - 29 mmol/L   Calcium 9.4 8.7 - 10.3 mg/dL  LP+ALT+AST Piccolo, Waived  Result Value Ref Range   ALT (SGPT) Piccolo, Waived 28 10 - 47 U/L   AST (SGOT) Piccolo, Waived 42 (H) 11 - 38 U/L   Cholesterol Piccolo, Waived 229 (H) <200 mg/dL   HDL Chol Piccolo, Waived 49 (L) >59 mg/dL   Triglycerides Piccolo,Waived 379 (H) <150 mg/dL   Chol/HDL Ratio Piccolo,Waive 4.6 mg/dL   LDL Chol Calc Piccolo Waived 104 (H) <100 mg/dL   VLDL Chol Calc Piccolo,Waive 76 (H) <30 mg/dL      Assessment & Plan:   Problem List Items Addressed This Visit      Cardiovascular and Mediastinum   Essential hypertension - Primary     The current medical regimen is effective;  continue present plan and medications.       Relevant Medications   hydrochlorothiazide (HYDRODIURIL) 25 MG tablet   carvedilol (COREG) 25 MG tablet   Other Relevant Orders   Urinalysis, Routine w reflex microscopic   CBC with Differential/Platelet   Comprehensive metabolic panel   PVD (peripheral vascular disease) ([place])    The current medical regimen is effective;  continue present plan and medications.       Relevant Medications   hydrochlorothiazide (HYDRODIURIL) 25 MG tablet   carvedilol (COREG) 25 MG tablet     Digestive   Gastroesophageal reflux disease without esophagitis    The current medical regimen is effective;  continue present plan and medications.         Other   Hyperlipidemia    The current medical regimen is effective;  continue present plan and medications.       Relevant Medications   hydrochlorothiazide (HYDRODIURIL) 25 MG tablet   carvedilol (COREG) 25 MG tablet   Other Relevant Orders   Lipid Panel w/o Chol/HDL Ratio   Advanced care planning/counseling discussion    A voluntary discussion about advanced care planning including explanation and discussion of advanced directives was extentively discussed with the patient.  Explained about the healthcare proxy and living will was reviewed and packet with forms with expiration of how to fill them out was given.  Time spent: Encounter 16+ min individuals present: Patient       Other Visit Diagnoses    Hypokalemia       Relevant Orders   Comprehensive metabolic panel   Thyroid disorder screen       Relevant Orders   TSH       Follow up plan: Return in about 6 months (around 10/13/2018) for BMP.

## 2018-04-15 ENCOUNTER — Other Ambulatory Visit: Payer: Medicare HMO

## 2018-04-15 DIAGNOSIS — E876 Hypokalemia: Secondary | ICD-10-CM | POA: Diagnosis not present

## 2018-04-15 DIAGNOSIS — Z1329 Encounter for screening for other suspected endocrine disorder: Secondary | ICD-10-CM | POA: Diagnosis not present

## 2018-04-15 DIAGNOSIS — E785 Hyperlipidemia, unspecified: Secondary | ICD-10-CM | POA: Diagnosis not present

## 2018-04-15 DIAGNOSIS — I10 Essential (primary) hypertension: Secondary | ICD-10-CM | POA: Diagnosis not present

## 2018-04-16 LAB — CBC WITH DIFFERENTIAL/PLATELET
Basophils Absolute: 0.1 10*3/uL (ref 0.0–0.2)
Basos: 1 %
EOS (ABSOLUTE): 0.2 10*3/uL (ref 0.0–0.4)
Eos: 3 %
HEMATOCRIT: 39.8 % (ref 34.0–46.6)
Hemoglobin: 13.2 g/dL (ref 11.1–15.9)
Immature Grans (Abs): 0 10*3/uL (ref 0.0–0.1)
Immature Granulocytes: 0 %
Lymphocytes Absolute: 2.6 10*3/uL (ref 0.7–3.1)
Lymphs: 44 %
MCH: 30 pg (ref 26.6–33.0)
MCHC: 33.2 g/dL (ref 31.5–35.7)
MCV: 91 fL (ref 79–97)
Monocytes Absolute: 0.6 10*3/uL (ref 0.1–0.9)
Monocytes: 10 %
NEUTROS PCT: 42 %
Neutrophils Absolute: 2.6 10*3/uL (ref 1.4–7.0)
PLATELETS: 211 10*3/uL (ref 150–450)
RBC: 4.4 x10E6/uL (ref 3.77–5.28)
RDW: 13 % (ref 11.7–15.4)
WBC: 6.1 10*3/uL (ref 3.4–10.8)

## 2018-04-16 LAB — LIPID PANEL W/O CHOL/HDL RATIO
Cholesterol, Total: 241 mg/dL — ABNORMAL HIGH (ref 100–199)
HDL: 43 mg/dL (ref >39)
LDL Calculated: 146 mg/dL — ABNORMAL HIGH (ref 0–99)
Triglycerides: 258 mg/dL — ABNORMAL HIGH (ref 0–149)
VLDL Cholesterol Cal: 52 mg/dL — ABNORMAL HIGH (ref 5–40)

## 2018-04-16 LAB — COMPREHENSIVE METABOLIC PANEL
A/G RATIO: 1.8 (ref 1.2–2.2)
ALT: 29 IU/L (ref 0–32)
AST: 33 IU/L (ref 0–40)
Albumin: 4.4 g/dL (ref 3.5–4.7)
Alkaline Phosphatase: 78 IU/L (ref 39–117)
BUN / CREAT RATIO: 23 (ref 12–28)
BUN: 22 mg/dL (ref 8–27)
Bilirubin Total: 0.6 mg/dL (ref 0.0–1.2)
CO2: 21 mmol/L (ref 20–29)
Calcium: 9.7 mg/dL (ref 8.7–10.3)
Chloride: 99 mmol/L (ref 96–106)
Creatinine, Ser: 0.96 mg/dL (ref 0.57–1.00)
GFR calc Af Amer: 63 mL/min/{1.73_m2} (ref >59)
GFR calc non Af Amer: 54 mL/min/{1.73_m2} — ABNORMAL LOW (ref >59)
Globulin, Total: 2.5 g/dL (ref 1.5–4.5)
Glucose: 97 mg/dL (ref 65–99)
Potassium: 4.7 mmol/L (ref 3.5–5.2)
Sodium: 137 mmol/L (ref 134–144)
Total Protein: 6.9 g/dL (ref 6.0–8.5)

## 2018-04-16 LAB — TSH: TSH: 2.81 u[IU]/mL (ref 0.450–4.500)

## 2018-04-18 ENCOUNTER — Encounter: Payer: Self-pay | Admitting: Family Medicine

## 2018-05-03 DIAGNOSIS — Z1283 Encounter for screening for malignant neoplasm of skin: Secondary | ICD-10-CM | POA: Diagnosis not present

## 2018-05-03 DIAGNOSIS — D229 Melanocytic nevi, unspecified: Secondary | ICD-10-CM | POA: Diagnosis not present

## 2018-05-03 DIAGNOSIS — Z85828 Personal history of other malignant neoplasm of skin: Secondary | ICD-10-CM | POA: Diagnosis not present

## 2018-05-03 DIAGNOSIS — D1800 Hemangioma unspecified site: Secondary | ICD-10-CM | POA: Diagnosis not present

## 2018-05-03 DIAGNOSIS — L300 Nummular dermatitis: Secondary | ICD-10-CM | POA: Diagnosis not present

## 2018-05-03 DIAGNOSIS — L821 Other seborrheic keratosis: Secondary | ICD-10-CM | POA: Diagnosis not present

## 2018-05-03 DIAGNOSIS — L565 Disseminated superficial actinic porokeratosis (DSAP): Secondary | ICD-10-CM | POA: Diagnosis not present

## 2018-05-03 DIAGNOSIS — L578 Other skin changes due to chronic exposure to nonionizing radiation: Secondary | ICD-10-CM | POA: Diagnosis not present

## 2018-05-03 DIAGNOSIS — L820 Inflamed seborrheic keratosis: Secondary | ICD-10-CM | POA: Diagnosis not present

## 2018-08-01 ENCOUNTER — Telehealth: Payer: Self-pay

## 2018-08-01 NOTE — Telephone Encounter (Signed)
Please see note below. Please advise. 

## 2018-08-01 NOTE — Telephone Encounter (Signed)
Spoke with patient to schedule overdue F/U with Dr. Rockey Situ. Offered telephone or video visit due to current clinic policies related to COVID 19 precautions. Patient declined stating she feels she needs a face to face visit. She reports intermittent chest pain and "heart fluttering" over the last several months that seems to worsen when walking for any length of time or when lying down in bed on her right side. She states her symptoms may be related to GERD but she is unsure so she prefers to be physically examined by Dr. Rockey Situ.

## 2018-08-02 NOTE — Telephone Encounter (Signed)
She has a history of tight neck muscles. Is this pain similar? Does she have any recorded vitals? Please let her know that, if she is having new exertional chest pain, she should go to the ED for further evaluation. She should also go to the ED if this is associated with progressive SOB, weakness, dizziness, syncope, or feeling like she might pass out.
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
## 2018-08-03 NOTE — Telephone Encounter (Signed)
No further recommendations beyond those previously documented. Thank you!

## 2018-10-04 ENCOUNTER — Ambulatory Visit: Payer: Medicare HMO | Admitting: Cardiovascular Disease

## 2018-10-13 ENCOUNTER — Encounter: Payer: Self-pay | Admitting: Family Medicine

## 2018-10-13 ENCOUNTER — Other Ambulatory Visit: Payer: Self-pay | Admitting: Family Medicine

## 2018-10-13 ENCOUNTER — Other Ambulatory Visit: Payer: Self-pay

## 2018-10-13 ENCOUNTER — Ambulatory Visit (INDEPENDENT_AMBULATORY_CARE_PROVIDER_SITE_OTHER): Payer: Medicare HMO | Admitting: Family Medicine

## 2018-10-13 DIAGNOSIS — I2510 Atherosclerotic heart disease of native coronary artery without angina pectoris: Secondary | ICD-10-CM | POA: Diagnosis not present

## 2018-10-13 DIAGNOSIS — I10 Essential (primary) hypertension: Secondary | ICD-10-CM

## 2018-10-13 DIAGNOSIS — E785 Hyperlipidemia, unspecified: Secondary | ICD-10-CM | POA: Diagnosis not present

## 2018-10-13 DIAGNOSIS — I739 Peripheral vascular disease, unspecified: Secondary | ICD-10-CM

## 2018-10-13 NOTE — Progress Notes (Addendum)
BP 130/65    Subjective:    Patient ID: Deborah Gibson, female    DOB: [birth year], 85 y.o.   MRN: [id number]  HPI: Deborah Gibson is a 85 y.o. female  Med check Patient follow-up bothered by neck and shoulder arthritis also seems to come on with exertion.  On further review patient has appointment with cardiology next week. Blood pressure seems to be doing well with no complaints from medications. Bothered by arthritis in her hip and legs remain the same with peripheral vascular disease. No other complaints from medications taking without problems.  Relevant past medical, surgical, family and social history reviewed and updated as indicated. Interim medical history since our last visit reviewed. Allergies and medications reviewed and updated.  Review of Systems  Constitutional: Negative.   Respiratory: Negative.   Cardiovascular: Negative.     Per HPI unless specifically indicated above     Objective:    BP 130/65   Wt Readings from Last 3 Encounters:  04/14/18 179 lb 2 oz (81.3 kg)  11/09/17 179 lb (81.2 kg)  10/27/17 179 lb (81.2 kg)    Physical Exam  Results for orders placed or performed in visit on 04/14/18  Microscopic Examination   URINE  Result Value Ref Range   WBC, UA 0-5 0 - 5 /hpf   RBC, UA 0-2 0 - 2 /hpf   Epithelial Cells (non renal) 0-10 0 - 10 /hpf   Bacteria, UA Few (A) None seen/Few  Urinalysis, Routine w reflex microscopic  Result Value Ref Range   Specific Gravity, UA 1.015 1.005 - 1.030   pH, UA 5.5 5.0 - 7.5   Color, UA Yellow Yellow   Appearance Ur Clear Clear   Leukocytes, UA 1+ (A) Negative   Protein, UA Negative Negative/Trace   Glucose, UA Negative Negative   Ketones, UA Negative Negative   RBC, UA Negative Negative   Bilirubin, UA Negative Negative   Urobilinogen, Ur 0.2 0.2 - 1.0 mg/dL   Nitrite, UA Negative Negative   Microscopic Examination See below:   CBC with Differential/Platelet  Result Value Ref Range   WBC 6.1  3.4 - 10.8 x10E3/uL   RBC 4.40 3.77 - 5.28 x10E6/uL   Hemoglobin 13.2 11.1 - 15.9 g/dL   Hematocrit 39.8 34.0 - 46.6 %   MCV 91 79 - 97 fL   MCH 30.0 26.6 - 33.0 pg   MCHC 33.2 31.5 - 35.7 g/dL   RDW 13.0 11.7 - 15.4 %   Platelets 211 150 - 450 x10E3/uL   Neutrophils 42 Not Estab. %   Lymphs 44 Not Estab. %   Monocytes 10 Not Estab. %   Eos 3 Not Estab. %   Basos 1 Not Estab. %   Neutrophils Absolute 2.6 1.4 - 7.0 x10E3/uL   Lymphocytes Absolute 2.6 0.7 - 3.1 x10E3/uL   Monocytes Absolute 0.6 0.1 - 0.9 x10E3/uL   EOS (ABSOLUTE) 0.2 0.0 - 0.4 x10E3/uL   Basophils Absolute 0.1 0.0 - 0.2 x10E3/uL   Immature Granulocytes 0 Not Estab. %   Immature Grans (Abs) 0.0 0.0 - 0.1 x10E3/uL  Comprehensive metabolic panel  Result Value Ref Range   Glucose 97 65 - 99 mg/dL   BUN 22 8 - 27 mg/dL   Creatinine, Ser 0.96 0.57 - 1.00 mg/dL   GFR calc non Af Amer 54 (L) >59 mL/min/1.73   GFR calc Af Amer 63 >59 mL/min/1.73   BUN/Creatinine Ratio 23 12 - 28   Sodium  137 134 - 144 mmol/L   Potassium 4.7 3.5 - 5.2 mmol/L   Chloride 99 96 - 106 mmol/L   CO2 21 20 - 29 mmol/L   Calcium 9.7 8.7 - 10.3 mg/dL   Total Protein 6.9 6.0 - 8.5 g/dL   Albumin 4.4 3.5 - 4.7 g/dL   Globulin, Total 2.5 1.5 - 4.5 g/dL   Albumin/Globulin Ratio 1.8 1.2 - 2.2   Bilirubin Total 0.6 0.0 - 1.2 mg/dL   Alkaline Phosphatase 78 39 - 117 IU/L   AST 33 0 - 40 IU/L   ALT 29 0 - 32 IU/L  Lipid Panel w/o Chol/HDL Ratio  Result Value Ref Range   Cholesterol, Total 241 (H) 100 - 199 mg/dL   Triglycerides 258 (H) 0 - 149 mg/dL   HDL 43 >39 mg/dL   VLDL Cholesterol Cal 52 (H) 5 - 40 mg/dL   LDL Calculated 146 (H) 0 - 99 mg/dL  TSH  Result Value Ref Range   TSH 2.810 0.450 - 4.500 uIU/mL      Assessment & Plan:   Problem List Items Addressed This Visit      Cardiovascular and Mediastinum   Essential hypertension    The current medical regimen is effective;  continue present plan and medications.       Coronary  artery disease    Has appointment with cardiology next week      PVD (peripheral vascular disease) ([place])    No change        Other   Hyperlipidemia    The current medical regimen is effective;  continue present plan and medications.          Telemedicine using audio/video telecommunications for a synchronous communication visit. Today's visit due to COVID-19 isolation precautions I connected with and verified that I am speaking with the correct person using two identifiers.   I discussed the limitations, risks, security and privacy concerns of performing an evaluation and management service by telecommunication and the availability of in person appointments. I also discussed with the patient that there may be a patient responsible charge related to this service. The patient expressed understanding and agreed to proceed. The patient's location is home. I am at home.   I discussed the assessment and treatment plan with the patient. The patient was provided an opportunity to ask questions and all were answered. The patient agreed with the plan and demonstrated an understanding of the instructions.   The patient was advised to call back or seek an in-person evaluation if the symptoms worsen or if the condition fails to improve as anticipated.   I provided 21+ minutes of time during this encounter.  Follow up plan: Return in about 6 months (around 04/15/2019) for Physical Exam.

## 2018-10-13 NOTE — Assessment & Plan Note (Signed)
The current medical regimen is effective;  continue present plan and medications.  

## 2018-10-13 NOTE — Assessment & Plan Note (Signed)
Has appointment with cardiology next week

## 2018-10-13 NOTE — Assessment & Plan Note (Signed)
No change 

## 2018-10-14 LAB — BASIC METABOLIC PANEL
BUN/Creatinine Ratio: 26 (ref 12–28)
BUN: 24 mg/dL (ref 8–27)
CO2: 22 mmol/L (ref 20–29)
Calcium: 9.8 mg/dL (ref 8.7–10.3)
Chloride: 100 mmol/L (ref 96–106)
Creatinine, Ser: 0.92 mg/dL (ref 0.57–1.00)
GFR calc Af Amer: 66 mL/min/{1.73_m2} (ref >59)
GFR calc non Af Amer: 57 mL/min/{1.73_m2} — ABNORMAL LOW (ref >59)
Glucose: 107 mg/dL — ABNORMAL HIGH (ref 65–99)
Potassium: 4.8 mmol/L (ref 3.5–5.2)
Sodium: 138 mmol/L (ref 134–144)

## 2018-10-21 ENCOUNTER — Telehealth: Payer: Self-pay | Admitting: Cardiovascular Disease

## 2018-10-21 NOTE — Telephone Encounter (Signed)
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
## 2018-10-24 ENCOUNTER — Other Ambulatory Visit: Payer: Self-pay

## 2018-10-24 ENCOUNTER — Ambulatory Visit (INDEPENDENT_AMBULATORY_CARE_PROVIDER_SITE_OTHER): Payer: Medicare HMO | Admitting: Cardiovascular Disease

## 2018-10-24 ENCOUNTER — Encounter: Payer: Self-pay | Admitting: Cardiovascular Disease

## 2018-10-24 VITALS — BP 130/60 | HR 63 | Ht 65.5 in | Wt 179.5 lb

## 2018-10-24 DIAGNOSIS — E782 Mixed hyperlipidemia: Secondary | ICD-10-CM | POA: Diagnosis not present

## 2018-10-24 DIAGNOSIS — R0789 Other chest pain: Secondary | ICD-10-CM | POA: Diagnosis not present

## 2018-10-24 DIAGNOSIS — I739 Peripheral vascular disease, unspecified: Secondary | ICD-10-CM | POA: Diagnosis not present

## 2018-10-24 DIAGNOSIS — R079 Chest pain, unspecified: Secondary | ICD-10-CM

## 2018-10-24 DIAGNOSIS — I10 Essential (primary) hypertension: Secondary | ICD-10-CM

## 2018-10-24 NOTE — Patient Instructions (Addendum)
Dr. Leanord Hawking, Jefm Bryant   Medication Instructions:  No changes  If you need a refill on your cardiac medications before your next appointment, please call your pharmacy.    Lab work: No new labs needed   If you have labs (blood work) drawn today and your tests are completely  normal, you will receive your results only by: Marland Kitchen MyChart Message (if you have MyChart) OR . A paper copy in the mail If you have any lab test that is abnormal or we need to change your treatment, we will call you to review the results.   Testing/Procedures: No new testing needed   Follow-Up: At [facility], you and your health needs are our priority.  As part of our continuing mission to provide you with exceptional heart care, we have created designated Provider Care Teams.  These Care Teams include your primary Cardiologist (physician) and Advanced Practice Providers (APPs -  Physician Assistants and Nurse Practitioners) who all work together to provide you with the care you need, when you need it.  . You will need a follow up appointment as needed .   Please call our office 2 months in advance to schedule this appointment.    . Providers on your designated Care Team:   . Murray Hodgkins, NP . Christell Faith, PA-C . Marrianne Mood, PA-C  Any Other Special Instructions Will Be Listed Below (If Applicable).  For educational health videos Log in to : [email or web address] Or : [email or web address], password : [email or web address]

## 2019-02-18 DIAGNOSIS — R69 Illness, unspecified: Secondary | ICD-10-CM | POA: Diagnosis not present

## 2019-05-24 ENCOUNTER — Ambulatory Visit (INDEPENDENT_AMBULATORY_CARE_PROVIDER_SITE_OTHER): Payer: Medicare HMO

## 2019-05-24 VITALS — Wt 182.0 lb

## 2019-05-24 DIAGNOSIS — Z0000 Encounter for general adult medical examination without abnormal findings: Secondary | ICD-10-CM | POA: Diagnosis not present

## 2019-05-24 NOTE — Patient Instructions (Signed)
Deborah Gibson , Thank you for taking time to come for your Medicare Wellness Visit. I appreciate your ongoing commitment to your health goals. Please review the following plan we discussed and let me know if I can assist you in the future.   Screening recommendations/referrals: Colonoscopy: no longer required Mammogram:  No longer required Bone Density:  Up to date  Recommended yearly ophthalmology/optometry visit for glaucoma screening and checkup Recommended yearly dental visit for hygiene and checkup  Vaccinations: Influenza vaccine: up to date Pneumococcal vaccine: up to date Tdap vaccine: up to date  Shingles vaccine: shingrix eligible     Advanced directives: Advance directive discussed with you today.Once this is complete please bring a copy in to our office so we can scan it into your chart.  Conditions/risks identified: none   Next appointment: Follow up in one year for your annual wellness visit    Preventive Care 65 Years and Older, Female Preventive care refers to lifestyle choices and visits with your health care provider that can promote health and wellness. What does preventive care include?  A yearly physical exam. This is also called an annual well check.  Dental exams once or twice a year.  Routine eye exams. Ask your health care provider how often you should have your eyes checked.  Personal lifestyle choices, including:  Daily care of your teeth and gums.  Regular physical activity.  Eating a healthy diet.  Avoiding tobacco and drug use.  Limiting alcohol use.  Practicing safe sex.  Taking low-dose aspirin every day.  Taking vitamin and mineral supplements as recommended by your health care provider. What happens during an annual well check? The services and screenings done by your health care provider during your annual well check will depend on your age, overall health, lifestyle risk factors, and family history of disease. Counseling  Your  health care provider may ask you questions about your:  Alcohol use.  Tobacco use.  Drug use.  Emotional well-being.  Home and relationship well-being.  Sexual activity.  Eating habits.  History of falls.  Memory and ability to understand (cognition).  Work and work Statistician.  Reproductive health. Screening  You may have the following tests or measurements:  Height, weight, and BMI.  Blood pressure.  Lipid and cholesterol levels. These may be checked every 5 years, or more frequently if you are over 53 years old.  Skin check.  Lung cancer screening. You may have this screening every year starting at age 84 if you have a 30-pack-year history of smoking and currently smoke or have quit within the past 15 years.  Fecal occult blood test (FOBT) of the stool. You may have this test every year starting at age 33.  Flexible sigmoidoscopy or colonoscopy. You may have a sigmoidoscopy every 5 years or a colonoscopy every 10 years starting at age 84.  Hepatitis C blood test.  Hepatitis B blood test.  Sexually transmitted disease (STD) testing.  Diabetes screening. This is done by checking your blood sugar (glucose) after you have not eaten for a while (fasting). You may have this done every 1-3 years.  Bone density scan. This is done to screen for osteoporosis. You may have this done starting at age 71.  Mammogram. This may be done every 1-2 years. Talk to your health care provider about how often you should have regular mammograms. Talk with your health care provider about your test results, treatment options, and if necessary, the need for more tests. Vaccines  Your health care provider may recommend certain vaccines, such as:  Influenza vaccine. This is recommended every year.  Tetanus, diphtheria, and acellular pertussis (Tdap, Td) vaccine. You may need a Td booster every 10 years.  Zoster vaccine. You may need this after age 17.  Pneumococcal 13-valent  conjugate (PCV13) vaccine. One dose is recommended after age 14.  Pneumococcal polysaccharide (PPSV23) vaccine. One dose is recommended after age 55. Talk to your health care provider about which screenings and vaccines you need and how often you need them. This information is not intended to replace advice given to you by your health care provider. Make sure you discuss any questions you have with your health care provider. Document Released: 04/12/2015 Document Revised: 12/04/2015 Document Reviewed: 01/15/2015 Elsevier Interactive Patient Education  2017 [place] Prevention in the Home Falls can cause injuries. They can happen to people of all ages. There are many things you can do to make your home safe and to help prevent falls. What can I do on the outside of my home?  Regularly fix the edges of walkways and driveways and fix any cracks.  Remove anything that might make you trip as you walk through a door, such as a raised step or threshold.  Trim any bushes or trees on the path to your home.  Use bright outdoor lighting.  Clear any walking paths of anything that might make someone trip, such as rocks or tools.  Regularly check to see if handrails are loose or broken. Make sure that both sides of any steps have handrails.  Any raised decks and porches should have guardrails on the edges.  Have any leaves, snow, or ice cleared regularly.  Use sand or salt on walking paths during winter.  Clean up any spills in your garage right away. This includes oil or grease spills. What can I do in the bathroom?  Use night lights.  Install grab bars by the toilet and in the tub and shower. Do not use towel bars as grab bars.  Use non-skid mats or decals in the tub or shower.  If you need to sit down in the shower, use a plastic, non-slip stool.  Keep the floor dry. Clean up any water that spills on the floor as soon as it happens.  Remove soap buildup in the tub or  shower regularly.  Attach bath mats securely with double-sided non-slip rug tape.  Do not have throw rugs and other things on the floor that can make you trip. What can I do in the bedroom?  Use night lights.  Make sure that you have a light by your bed that is easy to reach.  Do not use any sheets or blankets that are too big for your bed. They should not hang down onto the floor.  Have a firm chair that has side arms. You can use this for support while you get dressed.  Do not have throw rugs and other things on the floor that can make you trip. What can I do in the kitchen?  Clean up any spills right away.  Avoid walking on wet floors.  Keep items that you use a lot in easy-to-reach places.  If you need to reach something above you, use a strong step stool that has a grab bar.  Keep electrical cords out of the way.  Do not use floor polish or wax that makes floors slippery. If you must use wax, use non-skid floor wax.  Do  not have throw rugs and other things on the floor that can make you trip. What can I do with my stairs?  Do not leave any items on the stairs.  Make sure that there are handrails on both sides of the stairs and use them. Fix handrails that are broken or loose. Make sure that handrails are as long as the stairways.  Check any carpeting to make sure that it is firmly attached to the stairs. Fix any carpet that is loose or worn.  Avoid having throw rugs at the top or bottom of the stairs. If you do have throw rugs, attach them to the floor with carpet tape.  Make sure that you have a light switch at the top of the stairs and the bottom of the stairs. If you do not have them, ask someone to add them for you. What else can I do to help prevent falls?  Wear shoes that:  Do not have high heels.  Have rubber bottoms.  Are comfortable and fit you well.  Are closed at the toe. Do not wear sandals.  If you use a stepladder:  Make sure that it is fully  opened. Do not climb a closed stepladder.  Make sure that both sides of the stepladder are locked into place.  Ask someone to hold it for you, if possible.  Clearly mark and make sure that you can see:  Any grab bars or handrails.  First and last steps.  Where the edge of each step is.  Use tools that help you move around (mobility aids) if they are needed. These include:  Canes.  Walkers.  Scooters.  Crutches.  Turn on the lights when you go into a dark area. Replace any light bulbs as soon as they burn out.  Set up your furniture so you have a clear path. Avoid moving your furniture around.  If any of your floors are uneven, fix them.  If there are any pets around you, be aware of where they are.  Review your medicines with your doctor. Some medicines can make you feel dizzy. This can increase your chance of falling. Ask your doctor what other things that you can do to help prevent falls. This information is not intended to replace advice given to you by your health care provider. Make sure you discuss any questions you have with your health care provider. Document Released: 01/10/2009 Document Revised: 08/22/2015 Document Reviewed: 04/20/2014 Elsevier Interactive Patient Education  2017 [employer].

## 2019-05-24 NOTE — Progress Notes (Signed)
Subjective:   Deborah Gibson is a 86 y.o. female who presents for Medicare Annual (Subsequent) preventive examination.  This visit is being conducted via phone call  - after an attmept to do on video chat - due to the COVID-19 pandemic. This patient has given me verbal consent via phone to conduct this visit, patient states they are participating from their home address. Some vital signs may be absent or patient reported.   Patient identification: identified by name, DOB, and current address.    Review of Systems:   Cardiac Risk Factors include: advanced age (>43men, >33 women);dyslipidemia;hypertension     Objective:     Vitals: Wt 182 lb (82.6 kg)   BMI 29.83 kg/m   Body mass index is 29.83 kg/m.  Advanced Directives 05/24/2019 11/09/2017 10/27/2017 06/30/2017 10/14/2016 03/12/2016 02/18/2016  Does Patient Have a Medical Advance Directive? No No No No No No No  Would patient like information on creating a medical advance directive? - - No - Patient declined No - Patient declined Yes (MAU/Ambulatory/Procedural Areas - Information given) No - Patient declined No - patient declined information    Tobacco Social History   Tobacco Use  Smoking Status Former Smoker  . Packs/day: 3.00  . Years: 23.00  . Pack years: 69.00  . Types: Cigarettes  . Quit date: 04/15/1978  . Years since quitting: 41.1  Smokeless Tobacco Never Used     Counseling given: Not Answered   Clinical Intake:  Pre-visit preparation completed: Yes  Pain : No/denies pain     Nutritional Status: BMI 25 -29 Overweight Nutritional Risks: None Diabetes: No  How often do you need to have someone help you when you read instructions, pamphlets, or other written materials from your doctor or pharmacy?: 1 - Never  Interpreter Needed?: No  Information entered by :: Lenoard Helbert,LPN  Past Medical History:  Diagnosis Date  . Arthritis   . Cancer ([place])    skin ca  . GERD (gastroesophageal reflux disease)    . Hyperlipidemia   . Hypertension   . Seasonal allergies    Past Surgical History:  Procedure Laterality Date  . ABDOMINAL HYSTERECTOMY    . BLADDER SUSPENSION    . [place], [place]  . CATARACT EXTRACTION    . CORONARY ANGIOPLASTY  2016  . CYSTOCELE REPAIR    . ESOPHAGOGASTRODUODENOSCOPY (EGD) WITH PROPOFOL N/A 03/12/2016   Procedure: ESOPHAGOGASTRODUODENOSCOPY (EGD) WITH PROPOFOL;  Surgeon: Jonathon Bellows, MD;  Location: [facility] ENDOSCOPY;  Service: Endoscopy;  Laterality: N/A;  . EYE SURGERY     cataract surgery  . rectal vaginal fistula repair    . TONSILLECTOMY    . VAGINAL HYSTERECTOMY     Family History  Problem Relation Age of Onset  . Hypertension Mother   . Heart failure Mother   . Hyperlipidemia Mother   . Stroke Mother   . Cancer Sister   . Heart disease Sister   . Hypertension Sister   . Heart disease Father   . Diabetes Brother   . Heart disease Brother        pacer/defiib  . Hypertension Brother   . Heart attack Brother   . Heart disease Brother        CABG  . Leukemia Daughter    Social History   Socioeconomic History  . Marital status: Widowed    Spouse name: Not on file  . Number of children: Not on file  . Years of education:  Not on file  . Highest education level: Not on file  Occupational History  . Not on file  Tobacco Use  . Smoking status: Former Smoker    Packs/day: 3.00    Years: 23.00    Pack years: 69.00    Types: Cigarettes    Quit date: 04/15/1978    Years since quitting: 41.1  . Smokeless tobacco: Never Used  Substance and Sexual Activity  . Alcohol use: No    Alcohol/week: 0.0 standard drinks  . Drug use: No  . Sexual activity: Not on file  Other Topics Concern  . Not on file  Social History Narrative   ** Merged History Encounter **       Social Determinants of Health   Financial Resource Strain: Low Risk   . Difficulty of Paying Living Expenses: Not hard at all  Food Insecurity: No  Food Insecurity  . Worried About Charity fundraiser in the Last Year: Never true  . Ran Out of Food in the Last Year: Never true  Transportation Needs: No Transportation Needs  . Lack of Transportation (Medical): No  . Lack of Transportation (Non-Medical): No  Physical Activity:   . Days of Exercise per Week: Not on file  . Minutes of Exercise per Session: Not on file  Stress:   . Feeling of Stress : Not on file  Social Connections: Somewhat Isolated  . Frequency of Communication with Friends and Family: More than three times a week  . Frequency of Social Gatherings with Friends and Family: More than three times a week  . Attends Religious Services: More than 4 times per year  . Active Member of Clubs or Organizations: No  . Attends Archivist Meetings: Never  . Marital Status: Widowed    Outpatient Encounter Medications as of 05/24/2019  Medication Sig  . aspirin EC 81 MG tablet Take 81 mg by mouth daily.  . carvedilol (COREG) 25 MG tablet Take 1 tablet (25 mg total) by mouth 2 (two) times daily with a meal.  . cetirizine (ZYRTEC) 10 MG tablet Take 10 mg by mouth daily as needed.   . COD LIVER OIL PO Take by mouth daily.   . Coenzyme Q10 (COQ10) 200 MG CAPS Take 1 capsule by mouth daily.   . COLLAGEN PO Take by mouth.  . hydrochlorothiazide (HYDRODIURIL) 25 MG tablet Take 1 tablet (25 mg total) by mouth daily.  . meloxicam (MOBIC) 7.5 MG tablet Take 1 tablet (7.5 mg total) by mouth daily.  . NON FORMULARY daily. CBD oil and capsules  . Sodium Chloride-Xylitol (XLEAR SINUS CARE SPRAY NA) Place into the nose as directed.  Marland Kitchen VITAMIN A PO Take 300 mg by mouth daily.  . vitamin B-12 (CYANOCOBALAMIN) 500 MCG tablet Take 500 mcg by mouth daily.  Marland Kitchen VITAMIN D, ERGOCALCIFEROL, PO Take by mouth.  . pantoprazole (PROTONIX) 40 MG tablet Take 40 mg by mouth daily.  . TURMERIC CURCUMIN PO Take by mouth daily.  . [DISCONTINUED] fluticasone (FLONASE) 50 MCG/ACT nasal spray Place 2  sprays into both nostrils daily as needed.   . [DISCONTINUED] hydrocortisone 2.5 % cream Apply topically as needed.   . [DISCONTINUED] meclizine (ANTIVERT) 25 MG tablet Take 1 tablet (25 mg total) by mouth 3 (three) times daily as needed for dizziness. (Patient not taking: Reported on 05/24/2019)   No facility-administered encounter medications on file as of 05/24/2019.    Activities of Daily Living In your present state of health, do you  have any difficulty performing the following activities: 05/24/2019  Hearing? N  Comment no hearing aids  Vision? N  Comment reading glasses, dr.woodard annually  Difficulty concentrating or making decisions? N  Walking or climbing stairs? Y  Dressing or bathing? N  Doing errands, shopping? N  Preparing Food and eating ? N  Using the Toilet? N  In the past six months, have you accidently leaked urine? N  Do you have problems with loss of bowel control? N  Managing your Medications? N  Managing your Finances? N  Housekeeping or managing your Housekeeping? N  Some recent data might be hidden    Patient Care Team: Guadalupe Maple, MD as PCP - General (Family Medicine) [name], MD (Dermatology)    Assessment:   This is a routine wellness examination for Stephens.  Exercise Activities and Dietary recommendations Current Exercise Habits: Home exercise routine, Time (Minutes): 60, Frequency (Times/Week): 1, Weekly Exercise (Minutes/Week): 60, Exercise limited by: None identified  Goals    . DIET - INCREASE WATER INTAKE     Recommend drinking at least 6-8 glasses of water a day     . Increase water intake     Recommend drinking at least 3-4 glasses a day.        Fall Risk: Fall Risk  05/24/2019 10/27/2017 09/14/2017 02/17/2017 10/14/2016  Falls in the past year? 0 No No No No  Number falls in past yr: 0 - - - -  Injury with Fall? 0 - - - -    FALL RISK PREVENTION PERTAINING TO THE HOME:  Any stairs in or around the home? No  If so,  are there any without handrails? No   Home free of loose throw rugs in walkways, pet beds, electrical cords, etc? Yes  Adequate lighting in your home to reduce risk of falls? Yes   ASSISTIVE DEVICES UTILIZED TO PREVENT FALLS:  Life alert? No  Use of a cane, walker or w/c? No  Grab bars in the bathroom? No  Shower chair or bench in shower? No  Elevated toilet seat or a handicapped toilet? Yes   DME ORDERS:  DME order needed?  No   TIMED UP AND GO:  Unable to perform    Depression Screen PHQ 2/9 Scores 05/24/2019 10/27/2017 09/14/2017 02/17/2017  PHQ - 2 Score 0 0 0 0     Cognitive Function     6CIT Screen 10/27/2017 10/14/2016  What Year? 0 points 0 points  What month? 0 points 0 points  What time? 0 points 0 points  Count back from 20 0 points 0 points  Months in reverse 0 points 0 points  Repeat phrase 0 points 0 points  Total Score 0 0    Immunization History  Administered Date(s) Administered  . Influenza, High Dose Seasonal PF 01/31/2016, 01/27/2017, 01/13/2018, 02/18/2019  . Influenza,inj,Quad PF,6+ Mos 01/15/2015  . Influenza-Unspecified 01/10/2014  . Pneumococcal Conjugate-13 09/13/2013  . Pneumococcal-Unspecified 03/30/2005    Qualifies for Shingles Vaccine? Yes  Zostavax completed n/a. Due for Shingrix. Education has been provided regarding the importance of this vaccine. Pt has been advised to call insurance company to determine out of pocket expense. Advised may also receive vaccine at local pharmacy or Health Dept. Verbalized acceptance and understanding.  Tdap: Discussed need for TD/TDAP vaccine, patient verbalized understanding that this is not covered as a preventative with there insurance and to call the office if she develops any new skin injuries, ie: cuts, scrapes, bug bites, or  open wounds.  Flu Vaccine: up to date   Pneumococcal Vaccine: up to date   Covid-19: information given, declined.   Screening Tests Health Maintenance  Topic Date Due   . TETANUS/TDAP  05/23/2020 (Originally 10/27/1950)  . INFLUENZA VACCINE  Completed  . DEXA SCAN  Completed  . PNA vac Low Risk Adult  Completed    Cancer Screenings:  Colorectal Screening: no longer required  Mammogram: no longer required, left breast has been hurting.   Bone Density: completed 2015  Lung Cancer Screening: (Low Dose CT Chest recommended if Age 21-80 years, 30 pack-year currently smoking OR have quit w/in 15years.) does not qualify.     Additional Screening:  Hepatitis C Screening: does qualify; Completed   Vision Screening: Recommended annual ophthalmology exams for early detection of glaucoma and other disorders of the eye. Is the patient up to date with their annual eye exam?  Yes  Who is the provider or what is the name of the office in which the pt attends annual eye exams? Dr.Woodard   Dental Screening: Recommended annual dental exams for proper oral hygiene  Community Resource Referral:  CRR required this visit?  No       Plan:  I have personally reviewed and addressed the Medicare Annual Wellness questionnaire and have noted the following in the patient's chart:  A. Medical and social history B. Use of alcohol, tobacco or illicit drugs  C. Current medications and supplements D. Functional ability and status E.  Nutritional status F.  Physical activity G. Advance directives H. List of other physicians I.  Hospitalizations, surgeries, and ER visits in previous 12 months J.  [place] such as hearing and vision if needed, cognitive and depression L. Referrals and appointments   In addition, I have reviewed and discussed with patient certain preventive protocols, quality metrics, and best practice recommendations. A written personalized care plan for preventive services as well as general preventive health recommendations were provided to patient.  Signed,    Bevelyn Ngo, LPN  [facility] Nurse Health Advisor   Nurse Notes:  needs to switch from pantoprazole to something similar, states insurance doesn't cover it anymore. Also need refills. Patient is due for cpe, scheduled patient for cpe for 05/26/2019.  Declined screening mammograms, but does state she is having some left breast tenderness recently. Is unsure if its from her bra or sleeping wrong. Would like to discuss at her physical.

## 2019-05-26 ENCOUNTER — Encounter: Payer: Self-pay | Admitting: Nurse Practitioner

## 2019-05-31 ENCOUNTER — Ambulatory Visit (INDEPENDENT_AMBULATORY_CARE_PROVIDER_SITE_OTHER): Payer: Medicare HMO | Admitting: Nurse Practitioner

## 2019-05-31 ENCOUNTER — Encounter: Payer: Self-pay | Admitting: Nurse Practitioner

## 2019-05-31 ENCOUNTER — Other Ambulatory Visit: Payer: Self-pay

## 2019-05-31 VITALS — BP 138/68 | HR 68 | Temp 97.8°F | Ht 65.16 in | Wt 184.6 lb

## 2019-05-31 DIAGNOSIS — Z789 Other specified health status: Secondary | ICD-10-CM

## 2019-05-31 DIAGNOSIS — I10 Essential (primary) hypertension: Secondary | ICD-10-CM | POA: Diagnosis not present

## 2019-05-31 DIAGNOSIS — I2510 Atherosclerotic heart disease of native coronary artery without angina pectoris: Secondary | ICD-10-CM | POA: Diagnosis not present

## 2019-05-31 DIAGNOSIS — K219 Gastro-esophageal reflux disease without esophagitis: Secondary | ICD-10-CM | POA: Diagnosis not present

## 2019-05-31 DIAGNOSIS — Z1231 Encounter for screening mammogram for malignant neoplasm of breast: Secondary | ICD-10-CM

## 2019-05-31 DIAGNOSIS — E782 Mixed hyperlipidemia: Secondary | ICD-10-CM | POA: Diagnosis not present

## 2019-05-31 DIAGNOSIS — Z0000 Encounter for general adult medical examination without abnormal findings: Secondary | ICD-10-CM

## 2019-05-31 MED ORDER — PANTOPRAZOLE SODIUM 40 MG PO TBEC: 40.0000 mg | DELAYED_RELEASE_TABLET | Freq: Every day | ORAL | 3 refills | Status: DC

## 2019-05-31 MED ORDER — HYDROCHLOROTHIAZIDE 25 MG PO TABS: 25.0000 mg | ORAL_TABLET | Freq: Every day | ORAL | 4 refills | Status: DC

## 2019-05-31 MED ORDER — CARVEDILOL 25 MG PO TABS: 25.0000 mg | ORAL_TABLET | Freq: Two times a day (BID) | ORAL | 4 refills | Status: DC

## 2019-05-31 MED ORDER — MELOXICAM 7.5 MG PO TABS: 7.5000 mg | ORAL_TABLET | Freq: Every day | ORAL | 3 refills | Status: DC

## 2019-05-31 NOTE — Assessment & Plan Note (Signed)
Chronic, ongoing.  Continue current medication regimen and adjust as needed.  Recommend she check BP at home at least weekly and document.  Obtain labs today.  Refills sent in.  Continue collaboration with cardiology.  Return in 6 months.

## 2019-05-31 NOTE — Progress Notes (Signed)
BP 138/68 (BP Location: Left Arm)   Pulse 68   Temp 97.8 F (36.6 C) (Oral)   Ht 5' 5.16" (1.655 m)   Wt 184 lb 9.6 oz (83.7 kg)   SpO2 96%   BMI 30.57 kg/m    Subjective:    Patient ID: Deborah Gibson, female    DOB: 28-Mar-1932, 86 y.o.   MRN: [id number]  HPI: Deborah Gibson is a 86 y.o. female presenting on 05/31/2019 for comprehensive medical examination. Current medical complaints include:none  She currently lives with: self Menopausal Symptoms: no   HYPERTENSION / HYPERLIPIDEMIA Continues on HCTZ and Carvedilol.  Tried every statin there was, but all caused significant leg pain and refuses to take anymore.  Last saw cardiology 10/24/2018. Satisfied with current treatment? yes Duration of hypertension: chronic BP monitoring frequency: rarely BP range:  BP medication side effects: no Duration of hyperlipidemia: chronic Cholesterol medication side effects: no Cholesterol supplements: fish oil Medication compliance: good compliance Aspirin: yes Recent stressors: no Recurrent headaches: no Visual changes: no Palpitations: no Dyspnea: no Chest pain: no Lower extremity edema: no Dizzy/lightheaded: no  The ASCVD Risk score Mikey Bussing DC Jr., et al., 2013) failed to calculate for the following reasons:   The 2013 ASCVD risk score is only valid for ages 65 to 70   GERD Continues on Protonix daily.  Has been out of this for months, tried Zantac OTC which did not help.  Worsening heart burn without it. GERD control status: stable  Satisfied with current treatment? yes Heartburn frequency:  Medication side effects: no  Medication compliance: stable Previous GERD medications: Antacid use frequency:   Duration:  Nature:  Location:  Heartburn duration:  Alleviatiating factors:   Aggravating factors:  Dysphagia: no Odynophagia:  no Hematemesis: no Blood in stool: no EGD: yes   Depression Screen done today and results listed below:  Depression screen [facility] 2/9  05/24/2019 10/27/2017 09/14/2017 02/17/2017 10/14/2016  Decreased Interest 0 0 0 0 0  Down, Depressed, Hopeless 0 0 0 0 0  PHQ - 2 Score 0 0 0 0 0    The patient does not have a history of falls. I did not complete a risk assessment for falls. A plan of care for falls was not documented.   Past Medical History:  Past Medical History:  Diagnosis Date  . Arthritis   . Cancer ([place])    skin ca  . GERD (gastroesophageal reflux disease)   . Hyperlipidemia   . Hypertension   . Seasonal allergies     Surgical History:  Past Surgical History:  Procedure Laterality Date  . ABDOMINAL HYSTERECTOMY    . BLADDER SUSPENSION    . [place], [place]  . CATARACT EXTRACTION    . CORONARY ANGIOPLASTY  2016  . CYSTOCELE REPAIR    . ESOPHAGOGASTRODUODENOSCOPY (EGD) WITH PROPOFOL N/A 03/12/2016   Procedure: ESOPHAGOGASTRODUODENOSCOPY (EGD) WITH PROPOFOL;  Surgeon: Jonathon Bellows, MD;  Location: [facility] ENDOSCOPY;  Service: Endoscopy;  Laterality: N/A;  . EYE SURGERY     cataract surgery  . rectal vaginal fistula repair    . TONSILLECTOMY    . VAGINAL HYSTERECTOMY      Medications:  Current Outpatient Medications on File Prior to Visit  Medication Sig  . aspirin EC 81 MG tablet Take 81 mg by mouth daily.  . cetirizine (ZYRTEC) 10 MG tablet Take 10 mg by mouth daily as needed.   . COD LIVER OIL PO Take by mouth  daily.   . Coenzyme Q10 (COQ10) 200 MG CAPS Take 1 capsule by mouth daily.   . COLLAGEN PO Take by mouth.  . NON FORMULARY daily. CBD oil and capsules  . Sodium Chloride-Xylitol (XLEAR SINUS CARE SPRAY NA) Place into the nose as directed.  . TURMERIC CURCUMIN PO Take by mouth daily.  Marland Kitchen VITAMIN A PO Take 300 mg by mouth daily.  . vitamin B-12 (CYANOCOBALAMIN) 500 MCG tablet Take 500 mcg by mouth daily.  Marland Kitchen VITAMIN D, ERGOCALCIFEROL, PO Take by mouth.   No current facility-administered medications on file prior to visit.    Allergies:  Allergies  Allergen  Reactions  . Scopolamine Anaphylaxis  . Amlodipine   . Amlodipine Swelling  . Benazepril Cough  . Codeine     Sick on stomach   . Crestor [Rosuvastatin] Other (See Comments)    myalgias  . Lovastatin Other (See Comments)    Myalgias   . Scopolamine   . Statins     Legs ache   . Zocor [Simvastatin] Other (See Comments)    myalgias    Social History:  Social History   Socioeconomic History  . Marital status: Widowed    Spouse name: Not on file  . Number of children: Not on file  . Years of education: Not on file  . Highest education level: Not on file  Occupational History  . Not on file  Tobacco Use  . Smoking status: Former Smoker    Packs/day: 3.00    Years: 23.00    Pack years: 69.00    Types: Cigarettes    Quit date: 04/15/1978    Years since quitting: 41.1  . Smokeless tobacco: Never Used  Substance and Sexual Activity  . Alcohol use: No    Alcohol/week: 0.0 standard drinks  . Drug use: No  . Sexual activity: Not on file  Other Topics Concern  . Not on file  Social History Narrative   ** Merged History Encounter **       Social Determinants of Health   Financial Resource Strain: Low Risk   . Difficulty of Paying Living Expenses: Not hard at all  Food Insecurity: No Food Insecurity  . Worried About Charity fundraiser in the Last Year: Never true  . Ran Out of Food in the Last Year: Never true  Transportation Needs: No Transportation Needs  . Lack of Transportation (Medical): No  . Lack of Transportation (Non-Medical): No  Physical Activity:   . Days of Exercise per Week: Not on file  . Minutes of Exercise per Session: Not on file  Stress:   . Feeling of Stress : Not on file  Social Connections: Somewhat Isolated  . Frequency of Communication with Friends and Family: More than three times a week  . Frequency of Social Gatherings with Friends and Family: More than three times a week  . Attends Religious Services: More than 4 times per year  .  Active Member of Clubs or Organizations: No  . Attends Archivist Meetings: Never  . Marital Status: Widowed  Intimate Partner Violence:   . Fear of Current or Ex-Partner: Not on file  . Emotionally Abused: Not on file  . Physically Abused: Not on file  . Sexually Abused: Not on file   Social History   Tobacco Use  Smoking Status Former Smoker  . Packs/day: 3.00  . Years: 23.00  . Pack years: 69.00  . Types: Cigarettes  . Quit date: 04/15/1978  .  Years since quitting: 41.1  Smokeless Tobacco Never Used   Social History   Substance and Sexual Activity  Alcohol Use No  . Alcohol/week: 0.0 standard drinks    Family History:  Family History  Problem Relation Age of Onset  . Hypertension Mother   . Heart failure Mother   . Hyperlipidemia Mother   . Stroke Mother   . Cancer Sister   . Heart disease Sister   . Hypertension Sister   . Heart disease Father   . Diabetes Brother   . Heart disease Brother        pacer/defiib  . Hypertension Brother   . Heart attack Brother   . Heart disease Brother        CABG  . Leukemia Daughter     Past medical history, surgical history, medications, allergies, family history and social history reviewed with patient today and changes made to appropriate areas of the chart.   Review of Systems - negative All other ROS negative except what is listed above and in the HPI.      Objective:    BP 138/68 (BP Location: Left Arm)   Pulse 68   Temp 97.8 F (36.6 C) (Oral)   Ht 5' 5.16" (1.655 m)   Wt 184 lb 9.6 oz (83.7 kg)   SpO2 96%   BMI 30.57 kg/m   Wt Readings from Last 3 Encounters:  05/31/19 184 lb 9.6 oz (83.7 kg)  05/24/19 182 lb (82.6 kg)  10/24/18 179 lb 8 oz (81.4 kg)    Physical Exam Constitutional:      General: She is awake. She is not in acute distress.    Appearance: She is well-developed. She is not ill-appearing.  HENT:     Head: Normocephalic and atraumatic.     Right Ear: Hearing, tympanic  membrane, ear canal and external ear normal. No drainage.     Left Ear: Hearing, tympanic membrane, ear canal and external ear normal. No drainage.     Nose: Nose normal.     Right Sinus: No maxillary sinus tenderness or frontal sinus tenderness.     Left Sinus: No maxillary sinus tenderness or frontal sinus tenderness.     Mouth/Throat:     Mouth: Mucous membranes are moist.     Pharynx: Oropharynx is clear. Uvula midline. No pharyngeal swelling, oropharyngeal exudate or posterior oropharyngeal erythema.  Eyes:     General: Lids are normal.        Right eye: No discharge.        Left eye: No discharge.     Extraocular Movements: Extraocular movements intact.     Conjunctiva/sclera: Conjunctivae normal.     Pupils: Pupils are equal, round, and reactive to light.     Visual Fields: Right eye visual fields normal and left eye visual fields normal.  Neck:     Thyroid: No thyromegaly.     Vascular: No carotid bruit.     Trachea: Trachea normal.  Cardiovascular:     Rate and Rhythm: Normal rate and regular rhythm.     Heart sounds: Normal heart sounds. No murmur. No gallop.   Pulmonary:     Effort: Pulmonary effort is normal. No accessory muscle usage or respiratory distress.     Breath sounds: Normal breath sounds.  Chest:     Breasts:        Right: Normal.        Left: Normal.  Abdominal:     General: Bowel sounds are  normal.     Palpations: Abdomen is soft. There is no hepatomegaly or splenomegaly.     Tenderness: There is no abdominal tenderness.  Musculoskeletal:        General: Normal range of motion.     Cervical back: Normal range of motion and neck supple.     Right lower leg: No edema.     Left lower leg: No edema.  Lymphadenopathy:     Head:     Right side of head: No submental, submandibular, tonsillar, preauricular or posterior auricular adenopathy.     Left side of head: No submental, submandibular, tonsillar, preauricular or posterior auricular adenopathy.      Cervical: No cervical adenopathy.     Upper Body:     Right upper body: No supraclavicular, axillary or pectoral adenopathy.     Left upper body: No supraclavicular, axillary or pectoral adenopathy.  Skin:    General: Skin is warm and dry.     Capillary Refill: Capillary refill takes less than 2 seconds.     Findings: No rash.  Neurological:     Mental Status: She is alert and oriented to person, place, and time.     Cranial Nerves: Cranial nerves are intact.     Gait: Gait is intact.     Deep Tendon Reflexes: Reflexes are normal and symmetric.     Reflex Scores:      Brachioradialis reflexes are 2+ on the right side and 2+ on the left side.      Patellar reflexes are 2+ on the right side and 2+ on the left side. Psychiatric:        Attention and Perception: Attention normal.        Mood and Affect: Mood normal.        Speech: Speech normal.        Behavior: Behavior normal. Behavior is cooperative.        Thought Content: Thought content normal.        Judgment: Judgment normal.     Results for orders placed or performed in visit on [id number]  Basic metabolic panel  Result Value Ref Range   Glucose 107 (H) 65 - 99 mg/dL   BUN 24 8 - 27 mg/dL   Creatinine, Ser 0.92 0.57 - 1.00 mg/dL   GFR calc non Af Amer 57 (L) >59 mL/min/1.73   GFR calc Af Amer 66 >59 mL/min/1.73   BUN/Creatinine Ratio 26 12 - 28   Sodium 138 134 - 144 mmol/L   Potassium 4.8 3.5 - 5.2 mmol/L   Chloride 100 96 - 106 mmol/L   CO2 22 20 - 29 mmol/L   Calcium 9.8 8.7 - 10.3 mg/dL      Assessment & Plan:   Problem List Items Addressed This Visit      Cardiovascular and Mediastinum   Essential hypertension    Chronic, ongoing.  Continue current medication regimen and adjust as needed.  Recommend she check BP at home at least weekly and document.  Obtain labs today.  Refills sent in.  Continue collaboration with cardiology.  Return in 6 months.      Relevant Medications   carvedilol (COREG) 25 MG tablet     hydrochlorothiazide (HYDRODIURIL) 25 MG tablet   Other Relevant Orders   Comprehensive metabolic panel   TSH   CBC With Differential/Platelet   Coronary artery disease    Continue daily ASA.  She refuses statin therapy.      Relevant Medications  carvedilol (COREG) 25 MG tablet   hydrochlorothiazide (HYDRODIURIL) 25 MG tablet     Digestive   Gastroesophageal reflux disease without esophagitis    Chronic, ongoing.  Will restart Protonix, as this has worked best for her.  Script sent.  Trial break periods in future and if poor tolerance then continue therapy.  Mag level annually.      Relevant Medications   pantoprazole (PROTONIX) 40 MG tablet     Other   Hyperlipidemia    Chronic, ongoing.  Poor tolerance to statin in past and refuses to try any further.  Check lipid panel today.  Discussed possibility of injectable if needed, however based on age if patient wishes no medications this would be acceptable option too.        Relevant Medications   carvedilol (COREG) 25 MG tablet   hydrochlorothiazide (HYDRODIURIL) 25 MG tablet   Other Relevant Orders   Comprehensive metabolic panel   Lipid Panel w/o Chol/HDL Ratio   Statin intolerance    Did not tolerate any statins in past and refuses to try any further -- leg pain.       Other Visit Diagnoses    Annual physical exam    -  Primary   Encounter for screening mammogram for malignant neoplasm of breast       Would like mammogram, her sister had breast CA in 60's with mastectomy and grandmother did too.   Relevant Orders   MM DIGITAL SCREENING BILATERAL       Follow up plan: Return in about 6 months (around 12/01/2019) for HTN/HLD.   LABORATORY TESTING:  - Pap smear: not applicable  IMMUNIZATIONS:   - Tdap: Tetanus vaccination status reviewed: refused - Influenza: Up to date - Pneumovax: Up to date - Prevnar: Up to date - HPV: Not applicable - Zostavax vaccine: Refused  SCREENING: -Mammogram: Ordered today  -  Colonoscopy: Not applicable  - Bone Density: Refused  -Hearing Test: Refused  -Spirometry: Refused   PATIENT COUNSELING:   Advised to take 1 mg of folate supplement per day if capable of pregnancy.   Sexuality: Discussed sexually transmitted diseases, partner selection, use of condoms, avoidance of unintended pregnancy  and contraceptive alternatives.   Advised to avoid cigarette smoking.  I discussed with the patient that most people either abstain from alcohol or drink within safe limits (<=14/week and <=4 drinks/occasion for males, <=7/weeks and <= 3 drinks/occasion for females) and that the risk for alcohol disorders and other health effects rises proportionally with the number of drinks per week and how often a drinker exceeds daily limits.  Discussed cessation/primary prevention of drug use and availability of treatment for abuse.   Diet: Encouraged to adjust caloric intake to maintain  or achieve ideal body weight, to reduce intake of dietary saturated fat and total fat, to limit sodium intake by avoiding high sodium foods and not adding table salt, and to maintain adequate dietary potassium and calcium preferably from fresh fruits, vegetables, and low-fat dairy products.    stressed the importance of regular exercise  Injury prevention: Discussed safety belts, safety helmets, smoke detector, smoking near bedding or upholstery.   Dental health: Discussed importance of regular tooth brushing, flossing, and dental visits.    NEXT PREVENTATIVE PHYSICAL DUE IN 1 YEAR. Return in about 6 months (around 12/01/2019) for HTN/HLD.

## 2019-05-31 NOTE — Assessment & Plan Note (Signed)
Chronic, ongoing.  Poor tolerance to statin in past and refuses to try any further.  Check lipid panel today.  Discussed possibility of injectable if needed, however based on age if patient wishes no medications this would be acceptable option too.

## 2019-05-31 NOTE — Assessment & Plan Note (Signed)
Continue daily ASA.  She refuses statin therapy.

## 2019-05-31 NOTE — Patient Instructions (Signed)
[facility]  Address: [address], [place], [place] [address]  Phone: [phone]  Healthy Eating Following a healthy eating pattern may help you to achieve and maintain a healthy body weight, reduce the risk of chronic disease, and live a long and productive life. It is important to follow a healthy eating pattern at an appropriate calorie level for your body. Your nutritional needs should be met primarily through food by choosing a variety of nutrient-rich foods. What are tips for following this plan? Reading food labels  Read labels and choose the following: ? Reduced or low sodium. ? Juices with 100% fruit juice. ? Foods with low saturated fats and high polyunsaturated and monounsaturated fats. ? Foods with whole grains, such as whole wheat, cracked wheat, brown rice, and wild rice. ? Whole grains that are fortified with folic acid. This is recommended for women who are pregnant or who want to become pregnant.  Read labels and avoid the following: ? Foods with a lot of added sugars. These include foods that contain brown sugar, corn sweetener, corn syrup, dextrose, fructose, glucose, high-fructose corn syrup, honey, invert sugar, lactose, malt syrup, maltose, molasses, raw sugar, sucrose, trehalose, or turbinado sugar.  Do not eat more than the following amounts of added sugar per day:  6 teaspoons (25 g) for women.  9 teaspoons (38 g) for men. ? Foods that contain processed or refined starches and grains. ? Refined grain products, such as white flour, degermed cornmeal, white bread, and white rice. Shopping  Choose nutrient-rich snacks, such as vegetables, whole fruits, and nuts. Avoid high-calorie and high-sugar snacks, such as potato chips, fruit snacks, and candy.  Use oil-based dressings and spreads on foods instead of solid fats such as butter, stick margarine, or cream cheese.  Limit pre-made sauces, mixes, and "instant" products  such as flavored rice, instant noodles, and ready-made pasta.  Try more plant-protein sources, such as tofu, tempeh, black beans, edamame, lentils, nuts, and seeds.  Explore eating plans such as the Mediterranean diet or vegetarian diet. Cooking  Use oil to saut or stir-fry foods instead of solid fats such as butter, stick margarine, or lard.  Try baking, boiling, grilling, or broiling instead of frying.  Remove the fatty part of meats before cooking.  Steam vegetables in water or broth. Meal planning   At meals, imagine dividing your plate into fourths: ? One-half of your plate is fruits and vegetables. ? One-fourth of your plate is whole grains. ? One-fourth of your plate is protein, especially lean meats, poultry, eggs, tofu, beans, or nuts.  Include low-fat dairy as part of your daily diet. Lifestyle  Choose healthy options in all settings, including home, work, school, restaurants, or stores.  Prepare your food safely: ? Wash your hands after handling raw meats. ? Keep food preparation surfaces clean by regularly washing with hot, soapy water. ? Keep raw meats separate from ready-to-eat foods, such as fruits and vegetables. ? Cook seafood, meat, poultry, and eggs to the recommended internal temperature. ? Store foods at safe temperatures. In general:  Keep cold foods at 40F (4.4C) or below.  Keep hot foods at 140F (60C) or above.  Keep your freezer at 0F (-17.8C) or below.  Foods are no longer safe to eat when they have been between the temperatures of 40-140F (4.4-60C) for more than 2 hours. What foods should I eat? Fruits Aim to eat 2 cup-equivalents of fresh, canned (in natural juice), or frozen fruits   each day. Examples of 1 cup-equivalent of fruit include 1 small apple, 8 large strawberries, 1 cup canned fruit,  cup dried fruit, or 1 cup 100% juice. Vegetables Aim to eat 2-3 cup-equivalents of fresh and frozen vegetables each day, including different  varieties and colors. Examples of 1 cup-equivalent of vegetables include 2 medium carrots, 2 cups raw, leafy greens, 1 cup chopped vegetable (raw or cooked), or 1 medium baked potato. Grains Aim to eat 6 ounce-equivalents of whole grains each day. Examples of 1 ounce-equivalent of grains include 1 slice of bread, 1 cup ready-to-eat cereal, 3 cups popcorn, or  cup cooked rice, pasta, or cereal. Meats and other proteins Aim to eat 5-6 ounce-equivalents of protein each day. Examples of 1 ounce-equivalent of protein include 1 egg, 1/2 cup nuts or seeds, or 1 tablespoon (16 g) peanut butter. A cut of meat or fish that is the size of a deck of cards is about 3-4 ounce-equivalents.  Of the protein you eat each week, try to have at least 8 ounces come from seafood. This includes salmon, trout, herring, and anchovies. Dairy Aim to eat 3 cup-equivalents of fat-free or low-fat dairy each day. Examples of 1 cup-equivalent of dairy include 1 cup (240 mL) milk, 8 ounces (250 g) yogurt, 1 ounces (44 g) natural cheese, or 1 cup (240 mL) fortified soy milk. Fats and oils  Aim for about 5 teaspoons (21 g) per day. Choose monounsaturated fats, such as canola and olive oils, avocados, peanut butter, and most nuts, or polyunsaturated fats, such as sunflower, corn, and soybean oils, walnuts, pine nuts, sesame seeds, sunflower seeds, and flaxseed. Beverages  Aim for six 8-oz glasses of water per day. Limit coffee to three to five 8-oz cups per day.  Limit caffeinated beverages that have added calories, such as soda and energy drinks.  Limit alcohol intake to no more than 1 drink a day for nonpregnant women and 2 drinks a day for men. One drink equals 12 oz of beer (355 mL), 5 oz of wine (148 mL), or 1 oz of hard liquor (44 mL). Seasoning and other foods  Avoid adding excess amounts of salt to your foods. Try flavoring foods with herbs and spices instead of salt.  Avoid adding sugar to foods.  Try using  oil-based dressings, sauces, and spreads instead of solid fats. This information is based on general [place] nutrition guidelines. For more information, visit choosemyplate.gov. Exact amounts may vary based on your nutrition needs. Summary  A healthy eating plan may help you to maintain a healthy weight, reduce the risk of chronic diseases, and stay active throughout your life.  Plan your meals. Make sure you eat the right portions of a variety of nutrient-rich foods.  Try baking, boiling, grilling, or broiling instead of frying.  Choose healthy options in all settings, including home, work, school, restaurants, or stores. This information is not intended to replace advice given to you by your health care provider. Make sure you discuss any questions you have with your health care provider. Document Revised: 06/28/2017 Document Reviewed: 06/28/2017 Elsevier Patient Education  2020 [employer]vier Inc.  

## 2019-05-31 NOTE — Assessment & Plan Note (Signed)
Chronic, ongoing.  Will restart Protonix, as this has worked best for her.  Script sent.  Trial break periods in future and if poor tolerance then continue therapy.  Mag level annually.

## 2019-05-31 NOTE — Assessment & Plan Note (Signed)
Did not tolerate any statins in past and refuses to try any further -- leg pain.

## 2019-06-01 LAB — COMPREHENSIVE METABOLIC PANEL
ALT: 33 IU/L — ABNORMAL HIGH (ref 0–32)
AST: 36 IU/L (ref 0–40)
Albumin/Globulin Ratio: 1.7 (ref 1.2–2.2)
Albumin: 4.5 g/dL (ref 3.6–4.6)
Alkaline Phosphatase: 71 IU/L (ref 39–117)
BUN/Creatinine Ratio: 23 (ref 12–28)
BUN: 20 mg/dL (ref 8–27)
Bilirubin Total: 0.6 mg/dL (ref 0.0–1.2)
CO2: 22 mmol/L (ref 20–29)
Calcium: 9.6 mg/dL (ref 8.7–10.3)
Chloride: 102 mmol/L (ref 96–106)
Creatinine, Ser: 0.88 mg/dL (ref 0.57–1.00)
GFR calc Af Amer: 69 mL/min/{1.73_m2} (ref >59)
GFR calc non Af Amer: 60 mL/min/{1.73_m2} (ref >59)
Globulin, Total: 2.7 g/dL (ref 1.5–4.5)
Glucose: 85 mg/dL (ref 65–99)
Potassium: 4.2 mmol/L (ref 3.5–5.2)
Sodium: 140 mmol/L (ref 134–144)
Total Protein: 7.2 g/dL (ref 6.0–8.5)

## 2019-06-01 LAB — CBC WITH DIFFERENTIAL/PLATELET
Hematocrit: 38.5 % (ref 34.0–46.6)
Hemoglobin: 13.5 g/dL (ref 11.1–15.9)
Lymphocytes Absolute: 3.3 10*3/uL — ABNORMAL HIGH (ref 0.7–3.1)
Lymphs: 41 %
MCH: 31.1 pg (ref 26.6–33.0)
MCHC: 35.1 g/dL (ref 31.5–35.7)
MCV: 89 fL (ref 79–97)
MID (Absolute): 1.1 10*3/uL (ref 0.1–1.6)
MID: 13 %
Neutrophils Absolute: 3.7 10*3/uL (ref 1.4–7.0)
Neutrophils: 46 %
Platelets: 181 10*3/uL (ref 150–450)
RBC: 4.34 x10E6/uL (ref 3.77–5.28)
RDW: 13.3 % (ref 11.7–15.4)
WBC: 8.1 10*3/uL (ref 3.4–10.8)

## 2019-06-01 LAB — LIPID PANEL W/O CHOL/HDL RATIO
Cholesterol, Total: 252 mg/dL — ABNORMAL HIGH (ref 100–199)
HDL: 47 mg/dL (ref >39)
LDL Chol Calc (NIH): 152 mg/dL — ABNORMAL HIGH (ref 0–99)
Triglycerides: 290 mg/dL — ABNORMAL HIGH (ref 0–149)
VLDL Cholesterol Cal: 53 mg/dL — ABNORMAL HIGH (ref 5–40)

## 2019-06-01 LAB — TSH: TSH: 2.43 u[IU]/mL (ref 0.450–4.500)

## 2019-06-01 NOTE — Progress Notes (Signed)
Good morning, please let Deborah Gibson know via phone or letter: Your labs have returned.  Overall everything looks good with exception of cholesterol levels, which is expected.  They are elevated and I know you do not want any statin therapy due to past side effects.  In future if interested we could discuss injectables that help bring cholesterol levels down or an oral medication like Zetia, both which tend to have less side effects than statins.  At this time I know you prefer to focus on diet, but that is food for future thoughts. It was nice to meet you and I hope you have a wonderful day!!

## 2019-09-24 ENCOUNTER — Other Ambulatory Visit: Payer: Self-pay | Admitting: Nurse Practitioner

## 2019-09-24 NOTE — Telephone Encounter (Signed)
Requested Prescriptions  Pending Prescriptions Disp Refills  . meloxicam (MOBIC) 7.5 MG tablet [Pharmacy Med Name: MELOXICAM 7.5 MG TABLET] 90 tablet 0    Sig: TAKE 1 TABLET BY MOUTH EVERY DAY     Analgesics:  COX2 Inhibitors Passed - 09/24/2019  9:28 AM      Passed - HGB in normal range and within 360 days    Hemoglobin  Date Value Ref Range Status  05/31/2019 13.5 11.1 - 15.9 g/dL Final         Passed - Cr in normal range and within 360 days    Creatinine  Date Value Ref Range Status  05/01/2014 0.87 0.60 - 1.30 mg/dL Final   Creatinine, Ser  Date Value Ref Range Status  05/31/2019 0.88 0.57 - 1.00 mg/dL Final         Passed - Patient is not pregnant      Passed - Valid encounter within last 12 months    Recent Outpatient Visits          3 months ago Annual physical exam   [place] Cann[place], Barbaraann Faster, NP   11 months ago Essential hypertension   [facility] [place], Jeannette How, MD   1 year ago Essential hypertension   [place], Jeannette How, MD   2 years ago Essential hypertension   [place], Jeannette How, MD   2 years ago [place], Lilia Argue, [place]      Future Appointments            In 2 months Cannady, Barbaraann Faster, NP [facility], [place]   In 8 months  [facility], [place]

## 2019-12-12 ENCOUNTER — Ambulatory Visit: Payer: Medicare HMO | Admitting: Nurse Practitioner

## 2019-12-18 ENCOUNTER — Other Ambulatory Visit: Payer: Self-pay | Admitting: Nurse Practitioner

## 2019-12-19 ENCOUNTER — Ambulatory Visit: Payer: Medicare HMO | Admitting: Nurse Practitioner

## 2019-12-29 ENCOUNTER — Ambulatory Visit: Payer: Medicare HMO | Admitting: Nurse Practitioner

## 2020-01-10 DIAGNOSIS — Z8249 Family history of ischemic heart disease and other diseases of the circulatory system: Secondary | ICD-10-CM | POA: Diagnosis not present

## 2020-01-10 DIAGNOSIS — Z7982 Long term (current) use of aspirin: Secondary | ICD-10-CM | POA: Diagnosis not present

## 2020-01-10 DIAGNOSIS — Z823 Family history of stroke: Secondary | ICD-10-CM | POA: Diagnosis not present

## 2020-01-10 DIAGNOSIS — G8929 Other chronic pain: Secondary | ICD-10-CM | POA: Diagnosis not present

## 2020-01-10 DIAGNOSIS — M1990 Unspecified osteoarthritis, unspecified site: Secondary | ICD-10-CM | POA: Diagnosis not present

## 2020-01-10 DIAGNOSIS — I10 Essential (primary) hypertension: Secondary | ICD-10-CM | POA: Diagnosis not present

## 2020-01-10 DIAGNOSIS — Z791 Long term (current) use of non-steroidal anti-inflammatories (NSAID): Secondary | ICD-10-CM | POA: Diagnosis not present

## 2020-01-10 DIAGNOSIS — K219 Gastro-esophageal reflux disease without esophagitis: Secondary | ICD-10-CM | POA: Diagnosis not present

## 2020-01-10 DIAGNOSIS — Z803 Family history of malignant neoplasm of breast: Secondary | ICD-10-CM | POA: Diagnosis not present

## 2020-01-10 DIAGNOSIS — J309 Allergic rhinitis, unspecified: Secondary | ICD-10-CM | POA: Diagnosis not present

## 2020-03-14 ENCOUNTER — Other Ambulatory Visit: Payer: Self-pay | Admitting: Nurse Practitioner

## 2020-03-14 NOTE — Telephone Encounter (Signed)
Requested medications are due for refill today yes  Requested medications are on the active medication list yes  Last refill 12/18/19  Last visit Do not see this med/dx addressed in OV  Future visit scheduled 05/2020  Notes to clinic Not sure if this was to be continued.

## 2020-05-24 ENCOUNTER — Other Ambulatory Visit: Payer: Self-pay | Admitting: Nurse Practitioner

## 2020-05-29 ENCOUNTER — Ambulatory Visit: Payer: Self-pay

## 2020-05-31 ENCOUNTER — Ambulatory Visit (INDEPENDENT_AMBULATORY_CARE_PROVIDER_SITE_OTHER): Payer: Medicare HMO

## 2020-05-31 VITALS — Ht 65.5 in | Wt 175.0 lb

## 2020-05-31 DIAGNOSIS — Z0000 Encounter for general adult medical examination without abnormal findings: Secondary | ICD-10-CM

## 2020-05-31 NOTE — Patient Instructions (Signed)
Deborah Gibson , Thank you for taking time to come for your Medicare Wellness Visit. I appreciate your ongoing commitment to your health goals. Please review the following plan we discussed and let me know if I can assist you in the future.   Screening recommendations/referrals: Colonoscopy: not required Mammogram: not required Bone Density: completed 10/06/2013 Recommended yearly ophthalmology/optometry visit for glaucoma screening and checkup Recommended yearly dental visit for hygiene and checkup  Vaccinations: Influenza vaccine: due Pneumococcal vaccine: completed 09/13/2013 Tdap vaccine: due Shingles vaccine: discussed   Covid-19: decline  Advanced directives: Advance directive discussed with you today.   Conditions/risks identified: none  Next appointment: Follow up in one year for your annual wellness visit    Preventive Care 65 Years and Older, Female Preventive care refers to lifestyle choices and visits with your health care provider that can promote health and wellness. What does preventive care include?  A yearly physical exam. This is also called an annual well check.  Dental exams once or twice a year.  Routine eye exams. Ask your health care provider how often you should have your eyes checked.  Personal lifestyle choices, including:  Daily care of your teeth and gums.  Regular physical activity.  Eating a healthy diet.  Avoiding tobacco and drug use.  Limiting alcohol use.  Practicing safe sex.  Taking low-dose aspirin every day.  Taking vitamin and mineral supplements as recommended by your health care provider. What happens during an annual well check? The services and screenings done by your health care provider during your annual well check will depend on your age, overall health, lifestyle risk factors, and family history of disease. Counseling  Your health care provider may ask you questions about your:  Alcohol use.  Tobacco use.  Drug  use.  Emotional well-being.  Home and relationship well-being.  Sexual activity.  Eating habits.  History of falls.  Memory and ability to understand (cognition).  Work and work Statistician.  Reproductive health. Screening  You may have the following tests or measurements:  Height, weight, and BMI.  Blood pressure.  Lipid and cholesterol levels. These may be checked every 5 years, or more frequently if you are over 39 years old.  Skin check.  Lung cancer screening. You may have this screening every year starting at age 10 if you have a 30-pack-year history of smoking and currently smoke or have quit within the past 15 years.  Fecal occult blood test (FOBT) of the stool. You may have this test every year starting at age 35.  Flexible sigmoidoscopy or colonoscopy. You may have a sigmoidoscopy every 5 years or a colonoscopy every 10 years starting at age 5.  Hepatitis C blood test.  Hepatitis B blood test.  Sexually transmitted disease (STD) testing.  Diabetes screening. This is done by checking your blood sugar (glucose) after you have not eaten for a while (fasting). You may have this done every 1-3 years.  Bone density scan. This is done to screen for osteoporosis. You may have this done starting at age 67.  Mammogram. This may be done every 1-2 years. Talk to your health care provider about how often you should have regular mammograms. Talk with your health care provider about your test results, treatment options, and if necessary, the need for more tests. Vaccines  Your health care provider may recommend certain vaccines, such as:  Influenza vaccine. This is recommended every year.  Tetanus, diphtheria, and acellular pertussis (Tdap, Td) vaccine. You may need a  Td booster every 10 years.  Zoster vaccine. You may need this after age 35.  Pneumococcal 13-valent conjugate (PCV13) vaccine. One dose is recommended after age 49.  Pneumococcal polysaccharide  (PPSV23) vaccine. One dose is recommended after age 66. Talk to your health care provider about which screenings and vaccines you need and how often you need them. This information is not intended to replace advice given to you by your health care provider. Make sure you discuss any questions you have with your health care provider. Document Released: 04/12/2015 Document Revised: 12/04/2015 Document Reviewed: 01/15/2015 Elsevier Interactive Patient Education  2017 [place] Prevention in the Home Falls can cause injuries. They can happen to people of all ages. There are many things you can do to make your home safe and to help prevent falls. What can I do on the outside of my home?  Regularly fix the edges of walkways and driveways and fix any cracks.  Remove anything that might make you trip as you walk through a door, such as a raised step or threshold.  Trim any bushes or trees on the path to your home.  Use bright outdoor lighting.  Clear any walking paths of anything that might make someone trip, such as rocks or tools.  Regularly check to see if handrails are loose or broken. Make sure that both sides of any steps have handrails.  Any raised decks and porches should have guardrails on the edges.  Have any leaves, snow, or ice cleared regularly.  Use sand or salt on walking paths during winter.  Clean up any spills in your garage right away. This includes oil or grease spills. What can I do in the bathroom?  Use night lights.  Install grab bars by the toilet and in the tub and shower. Do not use towel bars as grab bars.  Use non-skid mats or decals in the tub or shower.  If you need to sit down in the shower, use a plastic, non-slip stool.  Keep the floor dry. Clean up any water that spills on the floor as soon as it happens.  Remove soap buildup in the tub or shower regularly.  Attach bath mats securely with double-sided non-slip rug tape.  Do not have  throw rugs and other things on the floor that can make you trip. What can I do in the bedroom?  Use night lights.  Make sure that you have a light by your bed that is easy to reach.  Do not use any sheets or blankets that are too big for your bed. They should not hang down onto the floor.  Have a firm chair that has side arms. You can use this for support while you get dressed.  Do not have throw rugs and other things on the floor that can make you trip. What can I do in the kitchen?  Clean up any spills right away.  Avoid walking on wet floors.  Keep items that you use a lot in easy-to-reach places.  If you need to reach something above you, use a strong step stool that has a grab bar.  Keep electrical cords out of the way.  Do not use floor polish or wax that makes floors slippery. If you must use wax, use non-skid floor wax.  Do not have throw rugs and other things on the floor that can make you trip. What can I do with my stairs?  Do not leave any items on the stairs.  Make sure that there are handrails on both sides of the stairs and use them. Fix handrails that are broken or loose. Make sure that handrails are as long as the stairways.  Check any carpeting to make sure that it is firmly attached to the stairs. Fix any carpet that is loose or worn.  Avoid having throw rugs at the top or bottom of the stairs. If you do have throw rugs, attach them to the floor with carpet tape.  Make sure that you have a light switch at the top of the stairs and the bottom of the stairs. If you do not have them, ask someone to add them for you. What else can I do to help prevent falls?  Wear shoes that:  Do not have high heels.  Have rubber bottoms.  Are comfortable and fit you well.  Are closed at the toe. Do not wear sandals.  If you use a stepladder:  Make sure that it is fully opened. Do not climb a closed stepladder.  Make sure that both sides of the stepladder are  locked into place.  Ask someone to hold it for you, if possible.  Clearly mark and make sure that you can see:  Any grab bars or handrails.  First and last steps.  Where the edge of each step is.  Use tools that help you move around (mobility aids) if they are needed. These include:  Canes.  Walkers.  Scooters.  Crutches.  Turn on the lights when you go into a dark area. Replace any light bulbs as soon as they burn out.  Set up your furniture so you have a clear path. Avoid moving your furniture around.  If any of your floors are uneven, fix them.  If there are any pets around you, be aware of where they are.  Review your medicines with your doctor. Some medicines can make you feel dizzy. This can increase your chance of falling. Ask your doctor what other things that you can do to help prevent falls. This information is not intended to replace advice given to you by your health care provider. Make sure you discuss any questions you have with your health care provider. Document Released: 01/10/2009 Document Revised: 08/22/2015 Document Reviewed: 04/20/2014 Elsevier Interactive Patient Education  2017 [employer].

## 2020-05-31 NOTE — Progress Notes (Signed)
I connected with Deborah Gibson today by telephone and verified that I am speaking with the correct person using two identifiers. Location patient: home Location provider: work Persons participating in the virtual visit: Jaleah, Lefevre LPN.   I discussed the limitations, risks, security and privacy concerns of performing an evaluation and management service by telephone and the availability of in person appointments. I also discussed with the patient that there may be a patient responsible charge related to this service. The patient expressed understanding and verbally consented to this telephonic visit.    Interactive audio and video telecommunications were attempted between this provider and patient, however failed, due to patient having technical difficulties OR patient did not have access to video capability.  We continued and completed visit with audio only.     Vital signs may be patient reported or missing.  Subjective:   Deborah Gibson is a 87 y.o. female who presents for Medicare Annual (Subsequent) preventive examination.  Review of Systems     Cardiac Risk Factors include: advanced age (>11men, >22 women);dyslipidemia;hypertension;sedentary lifestyle     Objective:    Today's Vitals   05/31/20 0941  Weight: 175 lb (79.4 kg)  Height: 5' 5.5" (1.664 m)   Body mass index is 28.68 kg/m.  Advanced Directives 05/31/2020 05/24/2019 11/09/2017 10/27/2017 06/30/2017 10/14/2016 03/12/2016  Does Patient Have a Medical Advance Directive? No No No No No No No  Would patient like information on creating a medical advance directive? - - - No - Patient declined No - Patient declined Yes (MAU/Ambulatory/Procedural Areas - Information given) No - Patient declined    Current Medications (verified) Outpatient Encounter Medications as of 05/31/2020  Medication Sig  . aspirin EC 81 MG tablet Take 81 mg by mouth daily.  Marland Kitchen CALCIUM MAGNESIUM ZINC PO Take 1 tablet by mouth daily.   . carvedilol (COREG) 25 MG tablet Take 1 tablet (25 mg total) by mouth 2 (two) times daily with a meal.  . cetirizine (ZYRTEC) 10 MG tablet Take 10 mg by mouth daily as needed.   . COD LIVER OIL PO Take by mouth daily.   . Coenzyme Q10 (COQ10) 200 MG CAPS Take 1 capsule by mouth daily.   . COLLAGEN PO Take by mouth.  . hydrochlorothiazide (HYDRODIURIL) 25 MG tablet Take 1 tablet (25 mg total) by mouth daily.  . NON FORMULARY daily. CBD oil and capsules  . pantoprazole (PROTONIX) 40 MG tablet TAKE 1 TABLET BY MOUTH EVERY DAY  . Sodium Chloride-Xylitol (XLEAR SINUS CARE SPRAY NA) Place into the nose as directed.  . TURMERIC CURCUMIN PO Take by mouth daily.  Marland Kitchen VITAMIN A PO Take 300 mg by mouth daily.  . vitamin B-12 (CYANOCOBALAMIN) 500 MCG tablet Take 500 mcg by mouth daily.  Marland Kitchen VITAMIN D, ERGOCALCIFEROL, PO Take by mouth.  . meloxicam (MOBIC) 7.5 MG tablet TAKE 1 TABLET BY MOUTH EVERY DAY (Patient not taking: Reported on 05/31/2020)   No facility-administered encounter medications on file as of 05/31/2020.    Allergies (verified) Scopolamine, Amlodipine, Amlodipine, Benazepril, Codeine, Crestor [rosuvastatin], Lovastatin, Scopolamine, Statins, and Zocor [simvastatin]   History: Past Medical History:  Diagnosis Date  . Arthritis   . Cancer ([place])    skin ca  . GERD (gastroesophageal reflux disease)   . Hyperlipidemia   . Hypertension   . Seasonal allergies    Past Surgical History:  Procedure Laterality Date  . ABDOMINAL HYSTERECTOMY    . BLADDER SUSPENSION    . CARDIAC  [place], [place]  . CATARACT EXTRACTION    . CORONARY ANGIOPLASTY  2016  . CYSTOCELE REPAIR    . ESOPHAGOGASTRODUODENOSCOPY (EGD) WITH PROPOFOL N/A 03/12/2016   Procedure: ESOPHAGOGASTRODUODENOSCOPY (EGD) WITH PROPOFOL;  Surgeon: Jonathon Bellows, MD;  Location: [facility] ENDOSCOPY;  Service: Endoscopy;  Laterality: N/A;  . EYE SURGERY     cataract surgery  . rectal vaginal fistula repair    .  TONSILLECTOMY    . VAGINAL HYSTERECTOMY     Family History  Problem Relation Age of Onset  . Hypertension Mother   . Heart failure Mother   . Hyperlipidemia Mother   . Stroke Mother   . Cancer Sister   . Heart disease Sister   . Hypertension Sister   . Heart disease Father   . Diabetes Brother   . Heart disease Brother        pacer/defiib  . Hypertension Brother   . Heart attack Brother   . Heart disease Brother        CABG  . Leukemia Daughter    Social History   Socioeconomic History  . Marital status: Widowed    Spouse name: Not on file  . Number of children: Not on file  . Years of education: Not on file  . Highest education level: Not on file  Occupational History  . Not on file  Tobacco Use  . Smoking status: Former Smoker    Packs/day: 3.00    Years: 23.00    Pack years: 69.00    Types: Cigarettes    Quit date: 04/15/1978    Years since quitting: 42.1  . Smokeless tobacco: Never Used  Vaping Use  . Vaping Use: Never used  Substance and Sexual Activity  . Alcohol use: No    Alcohol/week: 0.0 standard drinks  . Drug use: No  . Sexual activity: Not on file  Other Topics Concern  . Not on file  Social History Narrative   ** Merged History Encounter **       Social Determinants of Health   Financial Resource Strain: Low Risk   . Difficulty of Paying Living Expenses: Not hard at all  Food Insecurity: No Food Insecurity  . Worried About Charity fundraiser in the Last Year: Never true  . Ran Out of Food in the Last Year: Never true  Transportation Needs: No Transportation Needs  . Lack of Transportation (Medical): No  . Lack of Transportation (Non-Medical): No  Physical Activity: Inactive  . Days of Exercise per Week: 0 days  . Minutes of Exercise per Session: 0 min  Stress: No Stress Concern Present  . Feeling of Stress : Not at all  Social Connections: Not on file    Tobacco Counseling Counseling given: Not Answered   Clinical  Intake:  Pre-visit preparation completed: Yes  Pain : No/denies pain     Nutritional Status: BMI 25 -29 Overweight Nutritional Risks: None Diabetes: No  How often do you need to have someone help you when you read instructions, pamphlets, or other written materials from your doctor or pharmacy?: 1 - Never What is the last grade level you completed in school?: some college  Diabetic? no  Interpreter Needed?: No  Information entered by :: NAllen LPN   Activities of Daily Living In your present state of health, do you have any difficulty performing the following activities: 05/31/2020  Hearing? N  Vision? N  Difficulty concentrating or making decisions? N  Walking or  climbing stairs? N  Dressing or bathing? N  Doing errands, shopping? N  Preparing Food and eating ? N  Using the Toilet? N  In the past six months, have you accidently leaked urine? Y  Comment wears a pad  Do you have problems with loss of bowel control? Y  Managing your Medications? N  Managing your Finances? N  Housekeeping or managing your Housekeeping? N  Some recent data might be hidden    Patient Care Team: Charlynne Cousins, MD as PCP - General [name], MD (Dermatology)  Indicate any recent Medical Services you may have received from other than [facility] providers in the past year (date may be approximate).     Assessment:   This is a routine wellness examination for [place].  Hearing/Vision screen  Hearing Screening   125Hz  250Hz  500Hz  1000Hz  2000Hz  3000Hz  4000Hz  6000Hz  8000Hz   Right ear:           Left ear:           Vision Screening Comments: No regular eye exams, Dr. Ellin Mayhew  Dietary issues and exercise activities discussed: Current Exercise Habits: The patient does not participate in regular exercise at present  Goals    . DIET - INCREASE WATER INTAKE     Recommend drinking at least 6-8 glasses of water a day     . Increase water intake     Recommend drinking at least 3-4 glasses a  day.     . Patient Stated     05/31/2020, wants to walk better      Depression Screen PHQ 2/9 Scores 05/31/2020 05/24/2019 10/27/2017 09/14/2017 02/17/2017 10/14/2016 07/22/2016  PHQ - 2 Score 0 0 0 0 0 0 0    Fall Risk Fall Risk  05/31/2020 05/24/2019 10/27/2017 09/14/2017 02/17/2017  Falls in the past year? 0 0 No No No  Number falls in past yr: - 0 - - -  Injury with Fall? - 0 - - -  Risk for fall due to : Medication side effect - - - -  Follow up Falls evaluation completed;Education provided;Falls prevention discussed - - - -    FALL RISK PREVENTION PERTAINING TO THE HOME:  Any stairs in or around the home? Yes  If so, are there any without handrails? No  Home free of loose throw rugs in walkways, pet beds, electrical cords, etc? Yes  Adequate lighting in your home to reduce risk of falls? Yes   ASSISTIVE DEVICES UTILIZED TO PREVENT FALLS:  Life alert? No  Use of a cane, walker or w/c? No  Grab bars in the bathroom? No  Shower chair or bench in shower? No  Elevated toilet seat or a handicapped toilet? Yes   TIMED UP AND GO:  Was the test performed? No .     Cognitive Function:     6CIT Screen 05/31/2020 10/27/2017 10/14/2016  What Year? 0 points 0 points 0 points  What month? 0 points 0 points 0 points  What time? 0 points 0 points 0 points  Count back from 20 0 points 0 points 0 points  Months in reverse 0 points 0 points 0 points  Repeat phrase 0 points 0 points 0 points  Total Score 0 0 0    Immunizations Immunization History  Administered Date(s) Administered  . Influenza, High Dose Seasonal PF 01/31/2016, 01/27/2017, 01/13/2018, 02/18/2019  . Influenza,inj,Quad PF,6+ Mos 01/15/2015  . Influenza-Unspecified 01/10/2014  . Pneumococcal Conjugate-13 09/13/2013  . Pneumococcal-Unspecified 03/30/2005    TDAP  status: Due, Education has been provided regarding the importance of this vaccine. Advised may receive this vaccine at local pharmacy or Health Dept. Aware to  provide a copy of the vaccination record if obtained from local pharmacy or Health Dept. Verbalized acceptance and understanding.  Flu Vaccine status: Due, Education has been provided regarding the importance of this vaccine. Advised may receive this vaccine at local pharmacy or Health Dept. Aware to provide a copy of the vaccination record if obtained from local pharmacy or Health Dept. Verbalized acceptance and understanding.  Pneumococcal vaccine status: Up to date  Covid-19 vaccine status: Completed vaccines  Qualifies for Shingles Vaccine? Yes   Zostavax completed No   Shingrix Completed?: No.    Education has been provided regarding the importance of this vaccine. Patient has been advised to call insurance company to determine out of pocket expense if they have not yet received this vaccine. Advised may also receive vaccine at local pharmacy or Health Dept. Verbalized acceptance and understanding.  Screening Tests Health Maintenance  Topic Date Due  . COVID-19 Vaccine (1) Never done  . TETANUS/TDAP  Never done  . INFLUENZA VACCINE  10/29/2019  . DEXA SCAN  Completed  . PNA vac Low Risk Adult  Completed  . HPV VACCINES  Aged Out    Health Maintenance  Health Maintenance Due  Topic Date Due  . COVID-19 Vaccine (1) Never done  . TETANUS/TDAP  Never done  . INFLUENZA VACCINE  10/29/2019    Colorectal cancer screening: No longer required.   Mammogram status: No longer required due to age.  Bone Density status: Completed 7/10/215.   Lung Cancer Screening: (Low Dose CT Chest recommended if Age 47-80 years, 30 pack-year currently smoking OR have quit w/in 15years.) does not qualify.   Lung Cancer Screening Referral: no  Additional Screening:  Hepatitis C Screening: does not qualify;   Vision Screening: Recommended annual ophthalmology exams for early detection of glaucoma and other disorders of the eye. Is the patient up to date with their annual eye exam?  No  Who is  the provider or what is the name of the office in which the patient attends annual eye exams? Dr. Ellin Mayhew If pt is not established with a provider, would they like to be referred to a provider to establish care? No .   Dental Screening: Recommended annual dental exams for proper oral hygiene  Community Resource Referral / Chronic Care Management: CRR required this visit?  No   CCM required this visit?  No      Plan:     I have personally reviewed and noted the following in the patient's chart:   . Medical and social history . Use of alcohol, tobacco or illicit drugs  . Current medications and supplements . Functional ability and status . Nutritional status . Physical activity . Advanced directives . List of other physicians . Hospitalizations, surgeries, and ER visits in previous 12 months . Vitals . Screenings to include cognitive, depression, and falls . Referrals and appointments  In addition, I have reviewed and discussed with patient certain preventive protocols, quality metrics, and best practice recommendations. A written personalized care plan for preventive services as well as general preventive health recommendations were provided to patient.     Kellie Simmering, LPN   08/29/7033   Nurse Notes:

## 2020-06-26 ENCOUNTER — Telehealth: Payer: Self-pay

## 2020-06-26 ENCOUNTER — Other Ambulatory Visit: Payer: Self-pay | Admitting: Nurse Practitioner

## 2020-06-26 DIAGNOSIS — I10 Essential (primary) hypertension: Secondary | ICD-10-CM

## 2020-06-26 NOTE — Telephone Encounter (Signed)
Called pt to schedule appt for med refill. Pt is wanting to come in earlier for lab work. Please advise.

## 2020-06-26 NOTE — Telephone Encounter (Signed)
Requested medications are due for refill today.  yes  Requested medications are on the active medications list.  yes  Last refill. 05/31/2019  Future visit scheduled.   In 11 months  Notes to clinic.  More than 3 months overdue for OV.

## 2020-06-26 NOTE — Telephone Encounter (Signed)
Called pt scheduled appt pt has enough to last until the appt

## 2020-07-02 ENCOUNTER — Other Ambulatory Visit: Payer: Self-pay

## 2020-07-02 ENCOUNTER — Telehealth: Payer: Self-pay

## 2020-07-02 ENCOUNTER — Ambulatory Visit
Admission: RE | Admit: 2020-07-02 | Discharge: 2020-07-02 | Disposition: A | Discharge status: Home / Self Care | Payer: Medicare HMO | Source: Ambulatory Visit | Attending: Internal Medicine | Admitting: Internal Medicine

## 2020-07-02 ENCOUNTER — Ambulatory Visit
Admission: RE | Admit: 2020-07-02 | Discharge: 2020-07-02 | Disposition: A | Discharge status: Home / Self Care | Payer: Medicare HMO | Attending: Internal Medicine | Admitting: Internal Medicine

## 2020-07-02 ENCOUNTER — Encounter: Payer: Self-pay | Admitting: Internal Medicine

## 2020-07-02 ENCOUNTER — Ambulatory Visit (INDEPENDENT_AMBULATORY_CARE_PROVIDER_SITE_OTHER): Payer: Medicare HMO | Admitting: Internal Medicine

## 2020-07-02 ENCOUNTER — Encounter: Payer: Medicare HMO | Admitting: Nurse Practitioner

## 2020-07-02 VITALS — BP 176/69 | HR 65 | Temp 97.8°F | Ht 64.76 in | Wt 176.4 lb

## 2020-07-02 DIAGNOSIS — M50322 Other cervical disc degeneration at C5-C6 level: Secondary | ICD-10-CM | POA: Diagnosis not present

## 2020-07-02 DIAGNOSIS — R200 Anesthesia of skin: Secondary | ICD-10-CM

## 2020-07-02 DIAGNOSIS — M47816 Spondylosis without myelopathy or radiculopathy, lumbar region: Secondary | ICD-10-CM | POA: Diagnosis not present

## 2020-07-02 DIAGNOSIS — E785 Hyperlipidemia, unspecified: Secondary | ICD-10-CM | POA: Diagnosis not present

## 2020-07-02 DIAGNOSIS — R350 Frequency of micturition: Secondary | ICD-10-CM

## 2020-07-02 DIAGNOSIS — I10 Essential (primary) hypertension: Secondary | ICD-10-CM | POA: Diagnosis not present

## 2020-07-02 DIAGNOSIS — I700 Atherosclerosis of aorta: Secondary | ICD-10-CM | POA: Diagnosis not present

## 2020-07-02 DIAGNOSIS — R202 Paresthesia of skin: Secondary | ICD-10-CM | POA: Diagnosis not present

## 2020-07-02 DIAGNOSIS — M47817 Spondylosis without myelopathy or radiculopathy, lumbosacral region: Secondary | ICD-10-CM | POA: Diagnosis not present

## 2020-07-02 DIAGNOSIS — M4802 Spinal stenosis, cervical region: Secondary | ICD-10-CM | POA: Diagnosis not present

## 2020-07-02 DIAGNOSIS — M50323 Other cervical disc degeneration at C6-C7 level: Secondary | ICD-10-CM | POA: Diagnosis not present

## 2020-07-02 DIAGNOSIS — R739 Hyperglycemia, unspecified: Secondary | ICD-10-CM | POA: Diagnosis not present

## 2020-07-02 LAB — URINALYSIS, ROUTINE W REFLEX MICROSCOPIC
Bilirubin, UA: NEGATIVE
Glucose, UA: NEGATIVE
Nitrite, UA: NEGATIVE
RBC, UA: NEGATIVE
Specific Gravity, UA: 1.015 (ref 1.005–1.030)
Urobilinogen, Ur: 1 mg/dL (ref 0.2–1.0)
pH, UA: 6.5 (ref 5.0–7.5)

## 2020-07-02 LAB — MICROSCOPIC EXAMINATION

## 2020-07-02 LAB — BAYER DCA HB A1C WAIVED: HB A1C (BAYER DCA - WAIVED): 6.1 % (ref <7.0)

## 2020-07-02 MED ORDER — SULFAMETHOXAZOLE-TRIMETHOPRIM 800-160 MG PO TABS: 1.0000 | ORAL_TABLET | Freq: Two times a day (BID) | ORAL | 0 refills | Status: AC

## 2020-07-02 NOTE — Telephone Encounter (Signed)
Called patient and informed her that her urine showed an infection, advised Bactrim DS [id number] was sent in and to take as prescribed.

## 2020-07-02 NOTE — Progress Notes (Deleted)
There were no vitals taken for this visit.   Subjective:    Patient ID: Deborah Gibson, female    DOB: May 02, 1931, 87 y.o.   MRN: [id number]  HPI: Deborah Gibson is a 87 y.o. female presenting on 07/02/2020 for comprehensive medical examination. Current medical complaints include:{Blank single:19197::"none","***"}  She currently lives with: Menopausal Symptoms: {Blank single:19197::"yes","no"}  HYPERTENSION / [place] Satisfied with current treatment? {Blank single:19197::"yes","no"} Duration of hypertension: {Blank single:19197::"chronic","months","years"} BP monitoring frequency: {Blank single:19197::"not checking","rarely","daily","weekly","monthly","a few times a day","a few times a week","a few times a month"} BP range:  BP medication side effects: {Blank single:19197::"yes","no"} Past BP meds: {Blank OIBBCWUG:89169::"IHWT","UUEKCMKLKJ","ZPHXTAVWPV/XYIAXKPVVZ","SMOLMBEM","LJQGBEEFEO","FHQRFXJOIT/GPQD","IYMEBRAXEN (bystolic)","carvedilol","chlorthalidone","clonidine","diltiazem","exforge HCT","HCTZ","irbesartan (avapro)","labetalol","lisinopril","lisinopril-HCTZ","losartan (cozaar)","methyldopa","nifedipine","olmesartan (benicar)","olmesartan-HCTZ","quinapril","ramipril","spironalactone","tekturna","valsartan","valsartan-HCTZ","verapamil"} Duration of hyperlipidemia: {Blank single:19197::"chronic","months","years"} Cholesterol medication side effects: {Blank single:19197::"yes","no"} Cholesterol supplements: {Blank multiple:19196::"none","fish oil","niacin","red yeast rice"} Past cholesterol medications: {Blank multiple:19196::"none","atorvastain (lipitor)","lovastatin (mevacor)","pravastatin (pravachol)","rosuvastatin (crestor)","simvastatin (zocor)","vytorin","fenofibrate (tricor)","gemfibrozil","ezetimide (zetia)","niaspan","lovaza"} Medication compliance: {Blank single:19197::"excellent compliance","good compliance","fair compliance","poor compliance"} Aspirin: {Blank  single:19197::"yes","no"} Recent stressors: {Blank single:19197::"yes","no"} Recurrent headaches: {Blank single:19197::"yes","no"} Visual changes: {Blank single:19197::"yes","no"} Palpitations: {Blank single:19197::"yes","no"} Dyspnea: {Blank single:19197::"yes","no"} Chest pain: {Blank single:19197::"yes","no"} Lower extremity edema: {Blank single:19197::"yes","no"} Dizzy/lightheaded: {Blank single:19197::"yes","no"}  Depression Screen done today and results listed below:  Depression screen [facility] 2/9 05/31/2020 05/24/2019 10/27/2017 09/14/2017 02/17/2017  Decreased Interest 0 0 0 0 0  Down, Depressed, Hopeless 0 0 0 0 0  PHQ - 2 Score 0 0 0 0 0    The patient {has/does not have:19849} a history of falls. I {did/did not:19850} complete a risk assessment for falls. A plan of care for falls {was/was not:19852} documented.   Past Medical History:  Past Medical History:  Diagnosis Date  . Arthritis   . Cancer ([place])    skin ca  . GERD (gastroesophageal reflux disease)   . Hyperlipidemia   . Hypertension   . Seasonal allergies     Surgical History:  Past Surgical History:  Procedure Laterality Date  . ABDOMINAL HYSTERECTOMY    . BLADDER SUSPENSION    . [place], [place]  . CATARACT EXTRACTION    . CORONARY ANGIOPLASTY  2016  . CYSTOCELE REPAIR    . ESOPHAGOGASTRODUODENOSCOPY (EGD) WITH PROPOFOL N/A 03/12/2016   Procedure: ESOPHAGOGASTRODUODENOSCOPY (EGD) WITH PROPOFOL;  Surgeon: Jonathon Bellows, MD;  Location: [facility] ENDOSCOPY;  Service: Endoscopy;  Laterality: N/A;  . EYE SURGERY     cataract surgery  . rectal vaginal fistula repair    . TONSILLECTOMY    . VAGINAL HYSTERECTOMY      Medications:  Current Outpatient Medications on File Prior to Visit  Medication Sig  . aspirin EC 81 MG tablet Take 81 mg by mouth daily.  Marland Kitchen CALCIUM MAGNESIUM ZINC PO Take 1 tablet by mouth daily.  . carvedilol (COREG) 25 MG tablet Take 1 tablet (25 mg total) by mouth 2  (two) times daily with a meal.  . cetirizine (ZYRTEC) 10 MG tablet Take 10 mg by mouth daily as needed.   . COD LIVER OIL PO Take by mouth daily.   . Coenzyme Q10 (COQ10) 200 MG CAPS Take 1 capsule by mouth daily.   . COLLAGEN PO Take by mouth.  . hydrochlorothiazide (HYDRODIURIL) 25 MG tablet Take 1 tablet (25 mg total) by mouth daily.  . meloxicam (MOBIC) 7.5 MG tablet TAKE 1 TABLET BY MOUTH EVERY DAY (Patient not taking: Reported on 05/31/2020)  . NON FORMULARY daily. CBD oil and capsules  . pantoprazole (PROTONIX) 40 MG tablet TAKE 1 TABLET BY MOUTH EVERY DAY  . Sodium Chloride-Xylitol (XLEAR SINUS CARE SPRAY  NA) Place into the nose as directed.  . TURMERIC CURCUMIN PO Take by mouth daily.  Marland Kitchen VITAMIN A PO Take 300 mg by mouth daily.  . vitamin B-12 (CYANOCOBALAMIN) 500 MCG tablet Take 500 mcg by mouth daily.  Marland Kitchen VITAMIN D, ERGOCALCIFEROL, PO Take by mouth.   No current facility-administered medications on file prior to visit.    Allergies:  Allergies  Allergen Reactions  . Scopolamine Anaphylaxis  . Amlodipine   . Amlodipine Swelling  . Benazepril Cough  . Codeine     Sick on stomach   . Crestor [Rosuvastatin] Other (See Comments)    myalgias  . Lovastatin Other (See Comments)    Myalgias   . Scopolamine   . Statins     Legs ache   . Zocor [Simvastatin] Other (See Comments)    myalgias    Social History:  Social History   Socioeconomic History  . Marital status: Widowed    Spouse name: Not on file  . Number of children: Not on file  . Years of education: Not on file  . Highest education level: Not on file  Occupational History  . Not on file  Tobacco Use  . Smoking status: Former Smoker    Packs/day: 3.00    Years: 23.00    Pack years: 69.00    Types: Cigarettes    Quit date: 04/15/1978    Years since quitting: 42.2  . Smokeless tobacco: Never Used  Vaping Use  . Vaping Use: Never used  Substance and Sexual Activity  . Alcohol use: No    Alcohol/week:  0.0 standard drinks  . Drug use: No  . Sexual activity: Not on file  Other Topics Concern  . Not on file  Social History Narrative   ** Merged History Encounter **       Social Determinants of Health   Financial Resource Strain: Low Risk   . Difficulty of Paying Living Expenses: Not hard at all  Food Insecurity: No Food Insecurity  . Worried About Charity fundraiser in the Last Year: Never true  . Ran Out of Food in the Last Year: Never true  Transportation Needs: No Transportation Needs  . Lack of Transportation (Medical): No  . Lack of Transportation (Non-Medical): No  Physical Activity: Inactive  . Days of Exercise per Week: 0 days  . Minutes of Exercise per Session: 0 min  Stress: No Stress Concern Present  . Feeling of Stress : Not at all  Social Connections: Not on file  Intimate Partner Violence: Not on file   Social History   Tobacco Use  Smoking Status Former Smoker  . Packs/day: 3.00  . Years: 23.00  . Pack years: 69.00  . Types: Cigarettes  . Quit date: 04/15/1978  . Years since quitting: 42.2  Smokeless Tobacco Never Used   Social History   Substance and Sexual Activity  Alcohol Use No  . Alcohol/week: 0.0 standard drinks    Family History:  Family History  Problem Relation Age of Onset  . Hypertension Mother   . Heart failure Mother   . Hyperlipidemia Mother   . Stroke Mother   . Cancer Sister   . Heart disease Sister   . Hypertension Sister   . Heart disease Father   . Diabetes Brother   . Heart disease Brother        pacer/defiib  . Hypertension Brother   . Heart attack Brother   . Heart disease Brother  CABG  . Leukemia Daughter     Past medical history, surgical history, medications, allergies, family history and social history reviewed with patient today and changes made to appropriate areas of the chart.   ROS All other ROS negative except what is listed above and in the HPI.      Objective:    There were no vitals  taken for this visit.  Wt Readings from Last 3 Encounters:  05/31/20 175 lb (79.4 kg)  05/31/19 184 lb 9.6 oz (83.7 kg)  05/24/19 182 lb (82.6 kg)    Physical Exam  Results for orders placed or performed in visit on 05/31/19  Comprehensive metabolic panel  Result Value Ref Range   Glucose 85 65 - 99 mg/dL   BUN 20 8 - 27 mg/dL   Creatinine, Ser 0.88 0.57 - 1.00 mg/dL   GFR calc non Af Amer 60 >59 mL/min/1.73   GFR calc Af Amer 69 >59 mL/min/1.73   BUN/Creatinine Ratio 23 12 - 28   Sodium 140 134 - 144 mmol/L   Potassium 4.2 3.5 - 5.2 mmol/L   Chloride 102 96 - 106 mmol/L   CO2 22 20 - 29 mmol/L   Calcium 9.6 8.7 - 10.3 mg/dL   Total Protein 7.2 6.0 - 8.5 g/dL   Albumin 4.5 3.6 - 4.6 g/dL   Globulin, Total 2.7 1.5 - 4.5 g/dL   Albumin/Globulin Ratio 1.7 1.2 - 2.2   Bilirubin Total 0.6 0.0 - 1.2 mg/dL   Alkaline Phosphatase 71 39 - 117 IU/L   AST 36 0 - 40 IU/L   ALT 33 (H) 0 - 32 IU/L  Lipid Panel w/o Chol/HDL Ratio  Result Value Ref Range   Cholesterol, Total 252 (H) 100 - 199 mg/dL   Triglycerides 290 (H) 0 - 149 mg/dL   HDL 47 >39 mg/dL   VLDL Cholesterol Cal 53 (H) 5 - 40 mg/dL   LDL Chol Calc (NIH) 152 (H) 0 - 99 mg/dL  TSH  Result Value Ref Range   TSH 2.430 0.450 - 4.500 uIU/mL  CBC With Differential/Platelet  Result Value Ref Range   WBC 8.1 3.4 - 10.8 x10E3/uL   RBC 4.34 3.77 - 5.28 x10E6/uL   Hemoglobin 13.5 11.1 - 15.9 g/dL   Hematocrit 38.5 34.0 - 46.6 %   MCV 89 79 - 97 fL   MCH 31.1 26.6 - 33.0 pg   MCHC 35.1 31.5 - 35.7 g/dL   RDW 13.3 11.7 - 15.4 %   Platelets 181 150 - 450 x10E3/uL   Neutrophils 46 Not Estab. %   Lymphs 41 Not Estab. %   MID 13 Not Estab. %   Neutrophils Absolute 3.7 1.4 - 7.0 x10E3/uL   Lymphocytes Absolute 3.3 (H) 0.7 - 3.1 x10E3/uL   MID (Absolute) 1.1 0.1 - 1.6 X10E3/uL      Assessment & Plan:   Problem List Items Addressed This Visit      Cardiovascular and Mediastinum   Essential hypertension - Primary   Coronary  artery disease   PVD (peripheral vascular disease) ([place])     Other   Hyperlipidemia    Other Visit Diagnoses    Annual physical exam           Follow up plan: No follow-ups on file.   LABORATORY TESTING:  - Pap smear: not applicable  IMMUNIZATIONS:   - Tdap: Tetanus vaccination status reviewed: tetanus status unknown to the patient. - Influenza: Postponed to flu season - Pneumovax: Up  to date - Prevnar: Up to date - HPV: Not applicable - Zostavax vaccine: {Blank single:19197::"Up to date","Administered today","Not applicable","Refused","Given elsewhere"}  SCREENING: -Mammogram: {Blank single:19197::"Up to date","Ordered today","Not applicable","Refused","Done elsewhere"}  - Colonoscopy: {Blank single:19197::"Up to date","Ordered today","Not applicable","Refused","Done elsewhere"}  - Bone Density: {Blank single:19197::"Up to date","Ordered today","Not applicable","Refused","Done elsewhere"}  -Hearing Test: {Blank single:19197::"Up to date","Ordered today","Not applicable","Refused","Done elsewhere"}  -Spirometry: {Blank single:19197::"Up to date","Ordered today","Not applicable","Refused","Done elsewhere"}   PATIENT COUNSELING:   Advised to take 1 mg of folate supplement per day if capable of pregnancy.   Sexuality: Discussed sexually transmitted diseases, partner selection, use of condoms, avoidance of unintended pregnancy  and contraceptive alternatives.   Advised to avoid cigarette smoking.  I discussed with the patient that most people either abstain from alcohol or drink within safe limits (<=14/week and <=4 drinks/occasion for males, <=7/weeks and <= 3 drinks/occasion for females) and that the risk for alcohol disorders and other health effects rises proportionally with the number of drinks per week and how often a drinker exceeds daily limits.  Discussed cessation/primary prevention of drug use and availability of treatment for abuse.   Diet: Encouraged to adjust  caloric intake to maintain  or achieve ideal body weight, to reduce intake of dietary saturated fat and total fat, to limit sodium intake by avoiding high sodium foods and not adding table salt, and to maintain adequate dietary potassium and calcium preferably from fresh fruits, vegetables, and low-fat dairy products.    stressed the importance of regular exercise  Injury prevention: Discussed safety belts, safety helmets, smoke detector, smoking near bedding or upholstery.   Dental health: Discussed importance of regular tooth brushing, flossing, and dental visits.    NEXT PREVENTATIVE PHYSICAL DUE IN 1 YEAR. No follow-ups on file.

## 2020-07-02 NOTE — Progress Notes (Signed)
BP (!) 176/69   Pulse 65   Temp 97.8 F (36.6 C) (Oral)   Ht 5' 4.76" (1.645 m)   Wt 176 lb 6.4 oz (80 kg)   SpO2 98%   BMI 29.57 kg/m    Subjective:    Patient ID: Deborah Gibson, female    DOB: 11-09-1931, 87 y.o.   MRN: [id number]  HPI: Deborah Gibson is a 87 y.o. female  Pt is here to establish care  She says she had some left arm pain and some in her chest. She says she had a massage therapist - left leg hurts and has some cramps and tingling and numbness in toes and hands x 5 yrs or so gotten worse x 2 months.  Urinary Frequency  This is a recurrent (is on hctz, has some urgency and hesitancy ) problem. The current episode started 1 to 4 weeks ago (had A hysterectomy in the past and says her bladder was reconstructed - now feels like she has incontinence sometimes as well - ). The problem has been gradually worsening. The quality of the pain is described as aching. The pain is at a severity of 5/10. The pain is moderate. There has been no fever. Associated symptoms include frequency, hesitancy and urgency. Pertinent negatives include no chills, discharge, flank pain, hematuria, nausea, possible pregnancy, sweats or vomiting.    Chief Complaint  Patient presents with  . Annual Exam    Relevant past medical, surgical, family and social history reviewed and updated as indicated. Interim medical history since our last visit reviewed. Allergies and medications reviewed and updated.  Review of Systems  Constitutional: Negative for chills.  Gastrointestinal: Negative for nausea and vomiting.  Genitourinary: Positive for frequency, hesitancy and urgency. Negative for flank pain and hematuria.    Per HPI unless specifically indicated above     Objective:    BP (!) 176/69   Pulse 65   Temp 97.8 F (36.6 C) (Oral)   Ht 5' 4.76" (1.645 m)   Wt 176 lb 6.4 oz (80 kg)   SpO2 98%   BMI 29.57 kg/m   Wt Readings from Last 3 Encounters:  07/02/20 176 lb 6.4 oz (80 kg)   05/31/20 175 lb (79.4 kg)  05/31/19 184 lb 9.6 oz (83.7 kg)    Physical Exam Vitals and nursing note reviewed.  Constitutional:      General: She is not in acute distress.    Appearance: Normal appearance. She is not ill-appearing or diaphoretic.  HENT:     Head: Normocephalic and atraumatic.     Right Ear: Tympanic membrane and external ear normal. There is no impacted cerumen.     Left Ear: External ear normal.     Nose: No congestion or rhinorrhea.     Mouth/Throat:     Pharynx: No oropharyngeal exudate or posterior oropharyngeal erythema.  Eyes:     Conjunctiva/sclera: Conjunctivae normal.     Pupils: Pupils are equal, round, and reactive to light.  Cardiovascular:     Rate and Rhythm: Normal rate and regular rhythm.     Heart sounds: No murmur heard. No friction rub. No gallop.   Pulmonary:     Effort: No respiratory distress.     Breath sounds: No stridor. No wheezing or rhonchi.  Chest:     Chest wall: No tenderness.  Abdominal:     General: Abdomen is flat. Bowel sounds are normal. There is no distension.     Palpations: Abdomen  is soft. There is no mass.     Tenderness: There is no abdominal tenderness. There is no guarding.  Musculoskeletal:        General: No swelling or deformity.     Cervical back: Normal range of motion and neck supple. No rigidity or tenderness.     Right lower leg: No edema.     Left lower leg: No edema.  Skin:    General: Skin is warm and dry.     Coloration: Skin is not jaundiced.     Findings: No erythema.  Neurological:     Mental Status: She is alert and oriented to person, place, and time. Mental status is at baseline.  Psychiatric:        Mood and Affect: Mood normal.        Behavior: Behavior normal.        Thought Content: Thought content normal.        Judgment: Judgment normal.     Results for orders placed or performed in visit on 05/31/19  Comprehensive metabolic panel  Result Value Ref Range   Glucose 85 65 - 99  mg/dL   BUN 20 8 - 27 mg/dL   Creatinine, Ser 0.88 0.57 - 1.00 mg/dL   GFR calc non Af Amer 60 >59 mL/min/1.73   GFR calc Af Amer 69 >59 mL/min/1.73   BUN/Creatinine Ratio 23 12 - 28   Sodium 140 134 - 144 mmol/L   Potassium 4.2 3.5 - 5.2 mmol/L   Chloride 102 96 - 106 mmol/L   CO2 22 20 - 29 mmol/L   Calcium 9.6 8.7 - 10.3 mg/dL   Total Protein 7.2 6.0 - 8.5 g/dL   Albumin 4.5 3.6 - 4.6 g/dL   Globulin, Total 2.7 1.5 - 4.5 g/dL   Albumin/Globulin Ratio 1.7 1.2 - 2.2   Bilirubin Total 0.6 0.0 - 1.2 mg/dL   Alkaline Phosphatase 71 39 - 117 IU/L   AST 36 0 - 40 IU/L   ALT 33 (H) 0 - 32 IU/L  Lipid Panel w/o Chol/HDL Ratio  Result Value Ref Range   Cholesterol, Total 252 (H) 100 - 199 mg/dL   Triglycerides 290 (H) 0 - 149 mg/dL   HDL 47 >39 mg/dL   VLDL Cholesterol Cal 53 (H) 5 - 40 mg/dL   LDL Chol Calc (NIH) 152 (H) 0 - 99 mg/dL  TSH  Result Value Ref Range   TSH 2.430 0.450 - 4.500 uIU/mL  CBC With Differential/Platelet  Result Value Ref Range   WBC 8.1 3.4 - 10.8 x10E3/uL   RBC 4.34 3.77 - 5.28 x10E6/uL   Hemoglobin 13.5 11.1 - 15.9 g/dL   Hematocrit 38.5 34.0 - 46.6 %   MCV 89 79 - 97 fL   MCH 31.1 26.6 - 33.0 pg   MCHC 35.1 31.5 - 35.7 g/dL   RDW 13.3 11.7 - 15.4 %   Platelets 181 150 - 450 x10E3/uL   Neutrophils 46 Not Estab. %   Lymphs 41 Not Estab. %   MID 13 Not Estab. %   Neutrophils Absolute 3.7 1.4 - 7.0 x10E3/uL   Lymphocytes Absolute 3.3 (H) 0.7 - 3.1 x10E3/uL   MID (Absolute) 1.1 0.1 - 1.6 X10E3/uL        Current Outpatient Medications:  .  aspirin EC 81 MG tablet, Take 81 mg by mouth daily., Disp: , Rfl:  .  CALCIUM MAGNESIUM ZINC PO, Take 1 tablet by mouth daily., Disp: , Rfl:  .  carvedilol (COREG) 25 MG tablet, Take 1 tablet (25 mg total) by mouth 2 (two) times daily with a meal., Disp: 180 tablet, Rfl: 4 .  cetirizine (ZYRTEC) 10 MG tablet, Take 10 mg by mouth daily as needed. , Disp: , Rfl:  .  COD LIVER OIL PO, Take by mouth daily. , Disp: ,  Rfl:  .  Coenzyme Q10 (COQ10) 200 MG CAPS, Take 1 capsule by mouth daily. , Disp: , Rfl:  .  COLLAGEN PO, Take by mouth., Disp: , Rfl:  .  hydrochlorothiazide (HYDRODIURIL) 25 MG tablet, Take 1 tablet (25 mg total) by mouth daily., Disp: 90 tablet, Rfl: 4 .  meloxicam (MOBIC) 7.5 MG tablet, TAKE 1 TABLET BY MOUTH EVERY DAY, Disp: 90 tablet, Rfl: 0 .  NON FORMULARY, daily. CBD oil and capsules, Disp: , Rfl:  .  pantoprazole (PROTONIX) 40 MG tablet, TAKE 1 TABLET BY MOUTH EVERY DAY, Disp: 90 tablet, Rfl: 0 .  TURMERIC CURCUMIN PO, Take by mouth daily., Disp: , Rfl:  .  VITAMIN A PO, Take 300 mg by mouth daily., Disp: , Rfl:  .  vitamin B-12 (CYANOCOBALAMIN) 500 MCG tablet, Take 500 mcg by mouth daily., Disp: , Rfl:  .  VITAMIN D, ERGOCALCIFEROL, PO, Take by mouth., Disp: , Rfl:  .  Sodium Chloride-Xylitol (XLEAR SINUS CARE SPRAY NA), Place into the nose as directed. (Patient not taking: Reported on 07/02/2020), Disp: , Rfl:     Assessment & Plan:   1. Ur freq  ;will check UA - is positive for  ? Incontinence sec to urge vs stress  Will need to refer to urology for such     2. Numbness :in left upper and lower ext will check Xrays of L - C spine.  Check a1c.   3.HTN is on hctz and coreg  for such  Continue current meds.  Medication compliance emphasised. pt advised to keep Bp logs. Pt verbalised understanding of the same. Pt to have a low salt diet . Exercise to reach a goal of at least 150 mins a week.  lifestyle modifications explained and pt understands importance of the above.   Orders Placed This Encounter  Procedures  . DG Cervical Spine Complete    Order Specific Question:   Reason for Exam (SYMPTOM  OR DIAGNOSIS REQUIRED)    Answer:   left arm numbness    Order Specific Question:   Preferred imaging location?    Answer:   [facility]  . DG Lumbar Spine Complete    For back pain    Order Specific Question:   Reason for Exam (SYMPTOM  OR DIAGNOSIS REQUIRED)    Answer:    lower leg numbness    Order Specific Question:   Preferred imaging location?    Answer:   [facility]  . CBC with Differential/Platelet  . Comprehensive metabolic panel  . Lipid panel  . TSH  . Urinalysis, Routine w reflex microscopic  . Bayer DCA Hb A1c Waived  . Ambulatory referral to Urology    Referral Priority:   Urgent    Referral Type:   Consultation    Referral Reason:   Specialty Services Required    Requested Specialty:   Urology    Number of Visits Requested:   1       Problem List Items Addressed This Visit      Cardiovascular and Mediastinum   Essential hypertension - Primary   Relevant Orders   Ambulatory referral to Urology  CBC with Differential/Platelet (Completed)   Comprehensive metabolic panel (Completed)   Lipid panel (Completed)   TSH (Completed)   Urinalysis, Routine w reflex microscopic (Completed)    Other Visit Diagnoses    Numbness and tingling       Relevant Orders   DG Cervical Spine Complete (Completed)   DG Lumbar Spine Complete (Completed)   Bayer DCA Hb A1c Waived (Completed)   Urine frequency       Relevant Orders   Urine Culture       Follow up plan: No follow-ups on file.  of  note from last visit obtain results of labs as of 07/03/2020.  Patient A1c at normal range at 6.1 has prediabetes.  Will need to control diet. Obtain results from x-rays as well will need to work inpatients to go over labs and x-rays. Of note triglycerides very high will add fenofibrate patient will be informed of the above.

## 2020-07-03 LAB — LIPID PANEL
Chol/HDL Ratio: 5.2 ratio — ABNORMAL HIGH (ref 0.0–4.4)
Cholesterol, Total: 228 mg/dL — ABNORMAL HIGH (ref 100–199)
HDL: 44 mg/dL (ref >39)
LDL Chol Calc (NIH): 117 mg/dL — ABNORMAL HIGH (ref 0–99)
Triglycerides: 386 mg/dL — ABNORMAL HIGH (ref 0–149)
VLDL Cholesterol Cal: 67 mg/dL — ABNORMAL HIGH (ref 5–40)

## 2020-07-03 LAB — TSH: TSH: 2.85 u[IU]/mL (ref 0.450–4.500)

## 2020-07-03 LAB — CBC WITH DIFFERENTIAL/PLATELET
Basophils Absolute: 0.1 10*3/uL (ref 0.0–0.2)
Basos: 1 %
EOS (ABSOLUTE): 0.3 10*3/uL (ref 0.0–0.4)
Eos: 5 %
Hematocrit: 41.2 % (ref 34.0–46.6)
Hemoglobin: 14.1 g/dL (ref 11.1–15.9)
Immature Grans (Abs): 0 10*3/uL (ref 0.0–0.1)
Immature Granulocytes: 0 %
Lymphocytes Absolute: 2.4 10*3/uL (ref 0.7–3.1)
Lymphs: 32 %
MCH: 30.8 pg (ref 26.6–33.0)
MCHC: 34.2 g/dL (ref 31.5–35.7)
MCV: 90 fL (ref 79–97)
Monocytes Absolute: 0.7 10*3/uL (ref 0.1–0.9)
Monocytes: 9 %
Neutrophils Absolute: 4.1 10*3/uL (ref 1.4–7.0)
Neutrophils: 53 %
Platelets: 206 10*3/uL (ref 150–450)
RBC: 4.58 x10E6/uL (ref 3.77–5.28)
RDW: 13.2 % (ref 11.7–15.4)
WBC: 7.6 10*3/uL (ref 3.4–10.8)

## 2020-07-03 LAB — COMPREHENSIVE METABOLIC PANEL
ALT: 36 IU/L — ABNORMAL HIGH (ref 0–32)
AST: 47 IU/L — ABNORMAL HIGH (ref 0–40)
Albumin/Globulin Ratio: 1.7 (ref 1.2–2.2)
Albumin: 4.6 g/dL (ref 3.6–4.6)
Alkaline Phosphatase: 83 IU/L (ref 44–121)
BUN/Creatinine Ratio: 17 (ref 12–28)
BUN: 17 mg/dL (ref 8–27)
Bilirubin Total: 0.6 mg/dL (ref 0.0–1.2)
CO2: 21 mmol/L (ref 20–29)
Calcium: 9.6 mg/dL (ref 8.7–10.3)
Chloride: 100 mmol/L (ref 96–106)
Creatinine, Ser: 1 mg/dL (ref 0.57–1.00)
Globulin, Total: 2.7 g/dL (ref 1.5–4.5)
Glucose: 109 mg/dL — ABNORMAL HIGH (ref 65–99)
Potassium: 4.4 mmol/L (ref 3.5–5.2)
Sodium: 139 mmol/L (ref 134–144)
Total Protein: 7.3 g/dL (ref 6.0–8.5)
eGFR: 55 mL/min/{1.73_m2} — ABNORMAL LOW (ref >59)

## 2020-07-03 MED ORDER — FENOFIBRATE 145 MG PO TABS: 145.0000 mg | ORAL_TABLET | Freq: Every day | ORAL | 5 refills | Status: DC

## 2020-07-04 ENCOUNTER — Encounter: Payer: Self-pay | Admitting: Internal Medicine

## 2020-07-04 ENCOUNTER — Telehealth (INDEPENDENT_AMBULATORY_CARE_PROVIDER_SITE_OTHER): Payer: Medicare HMO | Admitting: Internal Medicine

## 2020-07-04 ENCOUNTER — Ambulatory Visit: Payer: Medicare HMO | Admitting: Internal Medicine

## 2020-07-04 ENCOUNTER — Telehealth: Payer: Self-pay

## 2020-07-04 VITALS — Ht 64.76 in | Wt 176.0 lb

## 2020-07-04 DIAGNOSIS — R200 Anesthesia of skin: Secondary | ICD-10-CM

## 2020-07-04 DIAGNOSIS — M1990 Unspecified osteoarthritis, unspecified site: Secondary | ICD-10-CM

## 2020-07-04 DIAGNOSIS — R202 Paresthesia of skin: Secondary | ICD-10-CM

## 2020-07-04 MED ORDER — GABAPENTIN 100 MG PO CAPS: 100.0000 mg | ORAL_CAPSULE | Freq: Every day | ORAL | 3 refills | Status: DC

## 2020-07-04 NOTE — Progress Notes (Signed)
There were no vitals taken for this visit.   Subjective:    Patient ID: Deborah Gibson, female    DOB: Oct 29, 1931, 87 y.o.   MRN: [id number]  HPI: Deborah Gibson is a 87 y.o. female  I connected with Deborah Gibson on 07/04/20 by a video enabled telemedicine application and verified that I am speaking with the correct person using two identifiers.  I discussed the limitations of evaluation and management by telemedicine. The patient expressed understanding and agreed to proceed. "I discussed the limitations of evaluation and management by telemedicine and the availability of in person appointments. The patient expressed understanding and agreed to proceed"      No chief complaint on file.   Relevant past medical, surgical, family and social history reviewed and updated as indicated. Interim medical history since our last visit reviewed. Allergies and medications reviewed and updated.  Review of Systems  Per HPI unless specifically indicated above     Objective:    There were no vitals taken for this visit.  Wt Readings from Last 3 Encounters:  07/02/20 176 lb 6.4 oz (80 kg)  05/31/20 175 lb (79.4 kg)  05/31/19 184 lb 9.6 oz (83.7 kg)    Physical Exam  Results for orders placed or performed in visit on 07/02/20  Microscopic Examination   Urine  Result Value Ref Range   WBC, UA 11-30 (A) 0 - 5 /hpf   RBC 0-2 0 - 2 /hpf   Epithelial Cells (non renal) 0-10 0 - 10 /hpf   Bacteria, UA Many (A) None seen/Few  CBC with Differential/Platelet  Result Value Ref Range   WBC 7.6 3.4 - 10.8 x10E3/uL   RBC 4.58 3.77 - 5.28 x10E6/uL   Hemoglobin 14.1 11.1 - 15.9 g/dL   Hematocrit 41.2 34.0 - 46.6 %   MCV 90 79 - 97 fL   MCH 30.8 26.6 - 33.0 pg   MCHC 34.2 31.5 - 35.7 g/dL   RDW 13.2 11.7 - 15.4 %   Platelets 206 150 - 450 x10E3/uL   Neutrophils 53 Not Estab. %   Lymphs 32 Not Estab. %   Monocytes 9 Not Estab. %   Eos 5 Not Estab. %   Basos 1 Not Estab. %    Neutrophils Absolute 4.1 1.4 - 7.0 x10E3/uL   Lymphocytes Absolute 2.4 0.7 - 3.1 x10E3/uL   Monocytes Absolute 0.7 0.1 - 0.9 x10E3/uL   EOS (ABSOLUTE) 0.3 0.0 - 0.4 x10E3/uL   Basophils Absolute 0.1 0.0 - 0.2 x10E3/uL   Immature Granulocytes 0 Not Estab. %   Immature Grans (Abs) 0.0 0.0 - 0.1 x10E3/uL  Comprehensive metabolic panel  Result Value Ref Range   Glucose 109 (H) 65 - 99 mg/dL   BUN 17 8 - 27 mg/dL   Creatinine, Ser 1.00 0.57 - 1.00 mg/dL   eGFR 55 (L) >59 mL/min/1.73   BUN/Creatinine Ratio 17 12 - 28   Sodium 139 134 - 144 mmol/L   Potassium 4.4 3.5 - 5.2 mmol/L   Chloride 100 96 - 106 mmol/L   CO2 21 20 - 29 mmol/L   Calcium 9.6 8.7 - 10.3 mg/dL   Total Protein 7.3 6.0 - 8.5 g/dL   Albumin 4.6 3.6 - 4.6 g/dL   Globulin, Total 2.7 1.5 - 4.5 g/dL   Albumin/Globulin Ratio 1.7 1.2 - 2.2   Bilirubin Total 0.6 0.0 - 1.2 mg/dL   Alkaline Phosphatase 83 44 - 121 IU/L   AST 47 (H)  0 - 40 IU/L   ALT 36 (H) 0 - 32 IU/L  Lipid panel  Result Value Ref Range   Cholesterol, Total 228 (H) 100 - 199 mg/dL   Triglycerides 386 (H) 0 - 149 mg/dL   HDL 44 >39 mg/dL   VLDL Cholesterol Cal 67 (H) 5 - 40 mg/dL   LDL Chol Calc (NIH) 117 (H) 0 - 99 mg/dL   Chol/HDL Ratio 5.2 (H) 0.0 - 4.4 ratio  TSH  Result Value Ref Range   TSH 2.850 0.450 - 4.500 uIU/mL  Urinalysis, Routine w reflex microscopic  Result Value Ref Range   Specific Gravity, UA 1.015 1.005 - 1.030   pH, UA 6.5 5.0 - 7.5   Color, UA Yellow Yellow   Appearance Ur Cloudy (A) Clear   Leukocytes,UA 2+ (A) Negative   Protein,UA 1+ (A) Negative/Trace   Glucose, UA Negative Negative   Ketones, UA Trace (A) Negative   RBC, UA Negative Negative   Bilirubin, UA Negative Negative   Urobilinogen, Ur 1.0 0.2 - 1.0 mg/dL   Nitrite, UA Negative Negative   Microscopic Examination See below:   Bayer DCA Hb A1c Waived  Result Value Ref Range   HB A1C (BAYER DCA - WAIVED) 6.1 <7.0 %        Current Outpatient Medications:  .   aspirin EC 81 MG tablet, Take 81 mg by mouth daily., Disp: , Rfl:  .  CALCIUM MAGNESIUM ZINC PO, Take 1 tablet by mouth daily., Disp: , Rfl:  .  carvedilol (COREG) 25 MG tablet, Take 1 tablet (25 mg total) by mouth 2 (two) times daily with a meal., Disp: 180 tablet, Rfl: 4 .  cetirizine (ZYRTEC) 10 MG tablet, Take 10 mg by mouth daily as needed. , Disp: , Rfl:  .  COD LIVER OIL PO, Take by mouth daily. , Disp: , Rfl:  .  Coenzyme Q10 (COQ10) 200 MG CAPS, Take 1 capsule by mouth daily. , Disp: , Rfl:  .  COLLAGEN PO, Take by mouth., Disp: , Rfl:  .  fenofibrate (TRICOR) 145 MG tablet, Take 1 tablet (145 mg total) by mouth daily., Disp: 30 tablet, Rfl: 5 .  hydrochlorothiazide (HYDRODIURIL) 25 MG tablet, Take 1 tablet (25 mg total) by mouth daily., Disp: 90 tablet, Rfl: 4 .  meloxicam (MOBIC) 7.5 MG tablet, TAKE 1 TABLET BY MOUTH EVERY DAY, Disp: 90 tablet, Rfl: 0 .  NON FORMULARY, daily. CBD oil and capsules, Disp: , Rfl:  .  pantoprazole (PROTONIX) 40 MG tablet, TAKE 1 TABLET BY MOUTH EVERY DAY, Disp: 90 tablet, Rfl: 0 .  Sodium Chloride-Xylitol (XLEAR SINUS CARE SPRAY NA), Place into the nose as directed. (Patient not taking: Reported on 07/02/2020), Disp: , Rfl:  .  sulfamethoxazole-trimethoprim (BACTRIM DS) 800-160 MG tablet, Take 1 tablet by mouth 2 (two) times daily for 5 days., Disp: 10 tablet, Rfl: 0 .  TURMERIC CURCUMIN PO, Take by mouth daily., Disp: , Rfl:  .  VITAMIN A PO, Take 300 mg by mouth daily., Disp: , Rfl:  .  vitamin B-12 (CYANOCOBALAMIN) 500 MCG tablet, Take 500 mcg by mouth daily., Disp: , Rfl:  .  VITAMIN D, ERGOCALCIFEROL, PO, Take by mouth., Disp: , Rfl:     Assessment & Plan:    LUMBAR SPINE - COMPLETE 4+ VIEW  FINDINGS: No fracture or spondylolisthesis is noted. Severe degenerative disc disease is noted at L4-5. Degenerative changes are seen involving posterior facet joints at L3-4, L4-5 and L5-S1.  IMPRESSION: Multilevel degenerative changes are noted. No  acute abnormality seen in the lumbar spine. Aortic Atherosclerosis (ICD10-I70.0).  1. Neuropathy sec to ? Severe arthritis  Will start pt on PT  And refer to ortho   2. Prediabetes - Lifestyle modifications advised to pt. A1c at 6.1  Portion control and avoiding high carb low fat diet advised.  Diet plan given to pt  exercise plan given and encouraged.   To increase exercise to 150 mins a week ie 21/2 hours a week. Pt verbalises understanding of the above.   3. HLD ; will start pt on fenofibrate. TG high at 386  recheck FLP, check LFT's work on diet, SE of meds explained to pt. low fat and high fiber diet explained to pt.   4. Mildly Elevated AST :  Ref. Range 07/02/2020 09:56  AST Latest Ref Range: 0 - 40 IU/L 47 (H)  ALT Latest Ref Range: 0 - 32 IU/L 36 (H)   Problem List Items Addressed This Visit   None      Follow up plan: No follow-ups on file.

## 2020-07-04 NOTE — Patient Instructions (Signed)
Paresthesia Paresthesia is a burning or prickling feeling. This feeling can happen in any part of the body. It often happens in the hands, arms, legs, or feet. Usually, it is not painful. In most cases, the feeling goes away in a short time and is not a sign of a serious problem. If you have paresthesia that lasts a long time, you need to be seen by your doctor. Follow these instructions at home: Alcohol use  Do not drink alcohol if: ? Your doctor tells you not to drink. ? You are pregnant, may be pregnant, or are planning to become pregnant.  If you drink alcohol: ? Limit how much you use to:  0-1 drink a day for women.  0-2 drinks a day for men. ? Be aware of how much alcohol is in your drink. In the [place] one drink equals one 12 oz bottle of beer (355 mL), one 5 oz glass of wine (148 mL), or one 1 oz glass of hard liquor (44 mL).   Nutrition  Eat a healthy diet. This includes: ? Eating foods that have a lot of fiber in them, such as fresh fruits and vegetables, whole grains, and beans. ? Limiting foods that have a lot of fat and processed sugars in them, such as fried or sweet foods.   General instructions  Take over-the-counter and prescription medicines only as told by your doctor.  Do not use any products that have nicotine or tobacco in them, such as cigarettes and e-cigarettes. If you need help quitting, ask your doctor.  If you have diabetes, work with your doctor to make sure your blood sugar stays in a healthy range.  If your feet feel numb: ? Check for redness, warmth, and swelling every day. ? Wear padded socks and comfortable shoes. These help protect your feet.  Keep all follow-up visits as told by your doctor. This is important. Contact a doctor if:  You have paresthesia that gets worse or does not go away.  Lose feeling (have numbness) after an injury.  Your burning or prickling feeling gets worse when you walk.  You have pain or cramps.  You feel dizzy  or pass out (faint).  You have a rash. Get help right away if you:  Feel weak or have new weakness in an arm or leg.  Have trouble walking or moving.  Have problems speaking, understanding, or seeing.  Feel confused.  Cannot control when you pee (urinate) or poop (have a bowel movement). Summary  Paresthesia is a burning or prickling feeling. It often happens in the hands, arms, legs, or feet.  In most cases, the feeling goes away in a short time and is not a sign of a serious problem.  If you have paresthesia that lasts a long time, you need to be seen by your doctor. This information is not intended to replace advice given to you by your health care provider. Make sure you discuss any questions you have with your health care provider. Document Revised: 12/26/2019 Document Reviewed: 12/26/2019 Elsevier Patient Education  2021 [employer]. Arthritis Arthritis means joint pain. It can also mean joint disease. A joint is a place where bones come together. There are more than 100 types of arthritis. What are the causes? This condition may be caused by:  Wear and tear of a joint. This is the most common cause.  A lot of acid in the blood, which leads to pain in the joint (gout).  Pain and swelling (inflammation)  in a joint.  Infection of a joint.  Injuries in the joint.  A reaction to medicines (allergy). In some cases, the cause may not be known. What are the signs or symptoms? Symptoms of this condition include:  Redness at a joint.  Swelling at a joint.  Stiffness at a joint.  Warmth coming from the joint.  A fever.  A feeling of being sick. How is this treated? This condition may be treated with:  Treating the cause, if it is known.  Rest.  Raising (elevating) the joint.  Putting cold or hot packs on the joint.  Medicines to treat symptoms and reduce pain and swelling.  Shots of medicines (cortisone) into the joint. You may also be told to make  changes in your life, such as doing exercises and losing weight. Follow these instructions at home: Medicines  Take over-the-counter and prescription medicines only as told by your doctor.  Do not take aspirin for pain if your doctor says that you may have gout. Activity  Rest your joint if your doctor tells you to.  Avoid activities that make the pain worse.  Exercise your joint regularly as told by your doctor. Try doing exercises like: ? Swimming. ? Water aerobics. ? Biking. ? Walking. Managing pain, stiffness, and swelling  If told, put ice on the affected area. ? Put ice in a plastic bag. ? Place a towel between your skin and the bag. ? Leave the ice on for 20 minutes, 2-3 times per day.  If your joint is swollen, raise (elevate) it above the level of your heart if told by your doctor.  If your joint feels stiff in the morning, try taking a warm shower.  If told, put heat on the affected area. Do this as often as told by your doctor. Use the heat source that your doctor recommends, such as a moist heat pack or a heating pad. If you have diabetes, do not apply heat without asking your doctor. To apply heat: ? Place a towel between your skin and the heat source. ? Leave the heat on for 20-30 minutes. ? Remove the heat if your skin turns bright red. This is very important if you are unable to feel pain, heat, or cold. You may have a greater risk of getting burned.      General instructions  Do not use any products that contain nicotine or tobacco, such as cigarettes, e-cigarettes, and chewing tobacco. If you need help quitting, ask your doctor.  Keep all follow-up visits as told by your doctor. This is important. Contact a doctor if:  The pain gets worse.  You have a fever. Get help right away if:  You have very bad pain in your joint.  You have swelling in your joint.  Your joint is red.  Many joints become painful and swollen.  You have very bad back  pain.  Your leg is very weak.  You cannot control your pee (urine) or poop (stool). Summary  Arthritis means joint pain. It can also mean joint disease. A joint is a place where bones come together.  The most common cause of this condition is wear and tear of a joint.  Symptoms of this condition include redness, swelling, or stiffness of the joint.  This condition is treated with rest, raising the joint, medicines, and putting cold or hot packs on the joint.  Follow your doctor's instructions about medicines, activity, exercises, and other home care treatments. This information is not  intended to replace advice given to you by your health care provider. Make sure you discuss any questions you have with your health care provider. Document Revised: 02/21/2018 Document Reviewed: 02/21/2018 Elsevier Patient Education  2021 [employer].

## 2020-07-05 ENCOUNTER — Telehealth: Payer: Self-pay

## 2020-07-05 LAB — URINE CULTURE

## 2020-07-05 NOTE — Telephone Encounter (Signed)
lvm that provider wants her to schedule for 1 month f.u and  A week before that apt come in for labs.

## 2020-07-05 NOTE — Telephone Encounter (Signed)
Patient is concerned about SE of gabapentin and gallstones - she had them 2-4 years ago. Advised patient would let her provider know that she is concerned and this possible SE and see if she thinks she is at risk and if she has other options with her treatment.

## 2020-07-05 NOTE — Telephone Encounter (Signed)
Patient had gallstone in the past and read gabapentin (NEURONTIN) 100 MG capsule can affect her health if taken, seeking clinical advice

## 2020-07-05 NOTE — Telephone Encounter (Signed)
FYI

## 2020-07-08 NOTE — Telephone Encounter (Signed)
Returned patient's call and informed her of Dr. Dawna Part answer that there is no evidence of such.

## 2020-07-08 NOTE — Telephone Encounter (Signed)
Reviewed physician's note with the patient however, she stated she'd rather not take any chances so she is not taking the gabapentin. Nor will she take the blood pressure medication as her readings over the last few days are as following 133/56  P. 65 155/67  P.62 Today 118/58 P.69 and 124/52 P.68.

## 2020-07-16 ENCOUNTER — Ambulatory Visit: Payer: Medicare HMO | Admitting: Internal Medicine

## 2020-07-23 NOTE — Telephone Encounter (Signed)
Patient was called and lab appt. Was made for this Friday to recheck her labs

## 2020-07-24 ENCOUNTER — Other Ambulatory Visit: Payer: Self-pay | Admitting: Nurse Practitioner

## 2020-07-24 DIAGNOSIS — I10 Essential (primary) hypertension: Secondary | ICD-10-CM

## 2020-07-24 NOTE — Telephone Encounter (Signed)
Requested medications are due for refill today.  yes  Requested medications are on the active medications list.  yes  Last refill. 05/31/2019  Future visit scheduled.   yes  Notes to clinic.  Rx is expired. 

## 2020-07-24 NOTE — Telephone Encounter (Signed)
Pt last apt on 07/16/2020 next apt on 07/26/2020 for labs and 08/02/2020 for office visit.

## 2020-07-26 ENCOUNTER — Other Ambulatory Visit: Payer: Medicare HMO

## 2020-07-26 ENCOUNTER — Other Ambulatory Visit: Payer: Self-pay

## 2020-07-26 DIAGNOSIS — R739 Hyperglycemia, unspecified: Secondary | ICD-10-CM | POA: Diagnosis not present

## 2020-07-26 DIAGNOSIS — R200 Anesthesia of skin: Secondary | ICD-10-CM

## 2020-07-26 DIAGNOSIS — R202 Paresthesia of skin: Secondary | ICD-10-CM | POA: Diagnosis not present

## 2020-07-26 DIAGNOSIS — M1990 Unspecified osteoarthritis, unspecified site: Secondary | ICD-10-CM

## 2020-07-26 DIAGNOSIS — Z136 Encounter for screening for cardiovascular disorders: Secondary | ICD-10-CM | POA: Diagnosis not present

## 2020-07-26 LAB — BAYER DCA HB A1C WAIVED: HB A1C (BAYER DCA - WAIVED): 6.1 % (ref <7.0)

## 2020-07-27 LAB — CBC WITH DIFFERENTIAL/PLATELET
Basophils Absolute: 0.1 10*3/uL (ref 0.0–0.2)
Basos: 1 %
EOS (ABSOLUTE): 0.3 10*3/uL (ref 0.0–0.4)
Eos: 4 %
Hematocrit: 41.4 % (ref 34.0–46.6)
Hemoglobin: 13.9 g/dL (ref 11.1–15.9)
Immature Grans (Abs): 0 10*3/uL (ref 0.0–0.1)
Immature Granulocytes: 0 %
Lymphocytes Absolute: 2.9 10*3/uL (ref 0.7–3.1)
Lymphs: 44 %
MCH: 30.6 pg (ref 26.6–33.0)
MCHC: 33.6 g/dL (ref 31.5–35.7)
MCV: 91 fL (ref 79–97)
Monocytes Absolute: 0.7 10*3/uL (ref 0.1–0.9)
Monocytes: 10 %
Neutrophils Absolute: 2.7 10*3/uL (ref 1.4–7.0)
Neutrophils: 41 %
Platelets: 183 10*3/uL (ref 150–450)
RBC: 4.54 x10E6/uL (ref 3.77–5.28)
RDW: 13.2 % (ref 11.7–15.4)
WBC: 6.6 10*3/uL (ref 3.4–10.8)

## 2020-07-27 LAB — LIPID PANEL
Chol/HDL Ratio: 5.6 ratio — ABNORMAL HIGH (ref 0.0–4.4)
Cholesterol, Total: 234 mg/dL — ABNORMAL HIGH (ref 100–199)
HDL: 42 mg/dL (ref >39)
LDL Chol Calc (NIH): 129 mg/dL — ABNORMAL HIGH (ref 0–99)
Triglycerides: 355 mg/dL — ABNORMAL HIGH (ref 0–149)
VLDL Cholesterol Cal: 63 mg/dL — ABNORMAL HIGH (ref 5–40)

## 2020-07-27 LAB — THYROID PANEL WITH TSH
Free Thyroxine Index: 2.4 (ref 1.2–4.9)
T3 Uptake Ratio: 26 % (ref 24–39)
T4, Total: 9.2 ug/dL (ref 4.5–12.0)
TSH: 5.11 u[IU]/mL — ABNORMAL HIGH (ref 0.450–4.500)

## 2020-07-27 LAB — COMPREHENSIVE METABOLIC PANEL
ALT: 38 IU/L — ABNORMAL HIGH (ref 0–32)
AST: 42 IU/L — ABNORMAL HIGH (ref 0–40)
Albumin/Globulin Ratio: 1.7 (ref 1.2–2.2)
Albumin: 4.4 g/dL (ref 3.6–4.6)
Alkaline Phosphatase: 76 IU/L (ref 44–121)
BUN/Creatinine Ratio: 25 (ref 12–28)
BUN: 23 mg/dL (ref 8–27)
Bilirubin Total: 0.4 mg/dL (ref 0.0–1.2)
CO2: 21 mmol/L (ref 20–29)
Calcium: 9.2 mg/dL (ref 8.7–10.3)
Chloride: 100 mmol/L (ref 96–106)
Creatinine, Ser: 0.91 mg/dL (ref 0.57–1.00)
Globulin, Total: 2.6 g/dL (ref 1.5–4.5)
Glucose: 113 mg/dL — ABNORMAL HIGH (ref 65–99)
Potassium: 4.2 mmol/L (ref 3.5–5.2)
Sodium: 139 mmol/L (ref 134–144)
Total Protein: 7 g/dL (ref 6.0–8.5)
eGFR: 61 mL/min/{1.73_m2} (ref >59)

## 2020-07-30 ENCOUNTER — Other Ambulatory Visit: Payer: Medicare HMO

## 2020-08-02 ENCOUNTER — Other Ambulatory Visit: Payer: Self-pay

## 2020-08-02 ENCOUNTER — Ambulatory Visit (INDEPENDENT_AMBULATORY_CARE_PROVIDER_SITE_OTHER): Payer: Medicare HMO | Admitting: Internal Medicine

## 2020-08-02 ENCOUNTER — Encounter: Payer: Self-pay | Admitting: Internal Medicine

## 2020-08-02 VITALS — BP 125/71 | HR 60 | Temp 97.8°F | Ht 64.76 in | Wt 173.0 lb

## 2020-08-02 DIAGNOSIS — Z1329 Encounter for screening for other suspected endocrine disorder: Secondary | ICD-10-CM

## 2020-08-02 DIAGNOSIS — E785 Hyperlipidemia, unspecified: Secondary | ICD-10-CM

## 2020-08-02 NOTE — Telephone Encounter (Signed)
erroneous error  

## 2020-08-02 NOTE — Patient Instructions (Signed)
Hypothyroidism  Hypothyroidism is when the thyroid gland does not make enough of certain hormones (it is underactive). The thyroid gland is a small gland located in the lower front part of the neck, just in front of the windpipe (trachea). This gland makes hormones that help control how the body uses food for energy (metabolism) as well as how the heart and brain function. These hormones also play a role in keeping your bones strong. When the thyroid is underactive, it produces too little of the hormones thyroxine (T4) and triiodothyronine (T3). What are the causes? This condition may be caused by:  Hashimoto's disease. This is a disease in which the body's disease-fighting system (immune system) attacks the thyroid gland. This is the most common cause.  Viral infections.  Pregnancy.  Certain medicines.  Birth defects.  Past radiation treatments to the head or neck for cancer.  Past treatment with radioactive iodine.  Past exposure to radiation in the environment.  Past surgical removal of part or all of the thyroid.  Problems with a gland in the center of the brain (pituitary gland).  Lack of enough iodine in the diet. What increases the risk? You are more likely to develop this condition if:  You are female.  You have a family history of thyroid conditions.  You use a medicine called lithium.  You take medicines that affect the immune system (immunosuppressants). What are the signs or symptoms? Symptoms of this condition include:  Feeling as though you have no energy (lethargy).  Not being able to tolerate cold.  Weight gain that is not explained by a change in diet or exercise habits.  Lack of appetite.  Dry skin.  Coarse hair.  Menstrual irregularity.  Slowing of thought processes.  Constipation.  Sadness or depression. How is this diagnosed? This condition may be diagnosed based on:  Your symptoms, your medical history, and a physical exam.  Blood  tests. You may also have imaging tests, such as an ultrasound or MRI. How is this treated? This condition is treated with medicine that replaces the thyroid hormones that your body does not make. After you begin treatment, it may take several weeks for symptoms to go away. Follow these instructions at home:  Take over-the-counter and prescription medicines only as told by your health care provider.  If you start taking any new medicines, tell your health care provider.  Keep all follow-up visits as told by your health care provider. This is important. ? As your condition improves, your dosage of thyroid hormone medicine may change. ? You will need to have blood tests regularly so that your health care provider can monitor your condition. Contact a health care provider if:  Your symptoms do not get better with treatment.  You are taking thyroid hormone replacement medicine and you: ? Sweat a lot. ? Have tremors. ? Feel anxious. ? Lose weight rapidly. ? Cannot tolerate heat. ? Have emotional swings. ? Have diarrhea. ? Feel weak. Get help right away if you have:  Chest pain.  An irregular heartbeat.  A rapid heartbeat.  Difficulty breathing. Summary  Hypothyroidism is when the thyroid gland does not make enough of certain hormones (it is underactive).  When the thyroid is underactive, it produces too little of the hormones thyroxine (T4) and triiodothyronine (T3).  The most common cause is Hashimoto's disease, a disease in which the body's disease-fighting system (immune system) attacks the thyroid gland. The condition can also be caused by viral infections, medicine, pregnancy, or   past radiation treatment to the head or neck.  Symptoms may include weight gain, dry skin, constipation, feeling as though you do not have energy, and not being able to tolerate cold.  This condition is treated with medicine to replace the thyroid hormones that your body does not make. This  information is not intended to replace advice given to you by your health care provider. Make sure you discuss any questions you have with your health care provider. Document Revised: 12/15/2019 Document Reviewed: 11/30/2019 Elsevier Patient Education  2021 [employer]  

## 2020-08-02 NOTE — Progress Notes (Signed)
BP 125/71   Pulse 60   Temp 97.8 F (36.6 C) (Oral)   Ht 5' 4.76" (1.645 m)   Wt 173 lb (78.5 kg)   SpO2 98%   BMI 29.00 kg/m    Subjective:    Patient ID: Deborah Gibson, female    DOB: 07/09/1931, 87 y.o.   MRN: [id number]  HPI: Deborah Gibson is a 87 y.o. female  Hypertension This is a chronic problem. Pertinent negatives include no palpitations. Identifiable causes of hypertension include a thyroid problem.  Hyperlipidemia This is a chronic problem. The problem is controlled.  Coronary Artery Disease Presents for follow-up visit. Pertinent negatives include no palpitations or weight gain. Risk factors include hyperlipidemia and hypertension.  Thyroid Problem Presents for initial visit. Symptoms include cold intolerance, diarrhea, dry skin, fatigue, hair loss, hoarse voice, nail problem and weight loss. Patient reports no anxiety, constipation, depressed mood, diaphoresis, heat intolerance, leg swelling, menstrual problem, palpitations, tremors, visual change or weight gain. Her past medical history is significant for hyperlipidemia.    Chief Complaint  Patient presents with  . Hypertension  . Hyperlipidemia  . Coronary Artery Disease  . PVD    Relevant past medical, surgical, family and social history reviewed and updated as indicated. Interim medical history since our last visit reviewed. Allergies and medications reviewed and updated.  Review of Systems  Constitutional: Positive for fatigue and weight loss. Negative for diaphoresis and weight gain.  HENT: Positive for hoarse voice.   Cardiovascular: Negative for palpitations.  Gastrointestinal: Positive for diarrhea. Negative for constipation.  Endocrine: Positive for cold intolerance. Negative for heat intolerance.  Genitourinary: Negative for menstrual problem.  Neurological: Negative for tremors.  Psychiatric/Behavioral: The patient is not nervous/anxious.     Per HPI unless specifically indicated  above     Objective:    BP 125/71   Pulse 60   Temp 97.8 F (36.6 C) (Oral)   Ht 5' 4.76" (1.645 m)   Wt 173 lb (78.5 kg)   SpO2 98%   BMI 29.00 kg/m   Wt Readings from Last 3 Encounters:  08/02/20 173 lb (78.5 kg)  07/04/20 176 lb (79.8 kg)  07/02/20 176 lb 6.4 oz (80 kg)    Physical Exam Vitals and nursing note reviewed.  Constitutional:      General: She is not in acute distress.    Appearance: Normal appearance. She is not ill-appearing or diaphoretic.  HENT:     Head: Normocephalic and atraumatic.     Right Ear: Tympanic membrane and external ear normal. There is no impacted cerumen.     Left Ear: External ear normal.     Nose: No congestion or rhinorrhea.     Mouth/Throat:     Pharynx: No oropharyngeal exudate or posterior oropharyngeal erythema.  Eyes:     Conjunctiva/sclera: Conjunctivae normal.     Pupils: Pupils are equal, round, and reactive to light.  Cardiovascular:     Rate and Rhythm: Normal rate and regular rhythm.     Heart sounds: No murmur heard. No friction rub. No gallop.   Pulmonary:     Effort: No respiratory distress.     Breath sounds: No stridor. No wheezing or rhonchi.  Chest:     Chest wall: No tenderness.  Abdominal:     General: Abdomen is flat. Bowel sounds are normal. There is no distension.     Palpations: Abdomen is soft. There is no mass.     Tenderness: There is no  abdominal tenderness. There is no guarding.  Musculoskeletal:        General: No swelling or deformity.     Cervical back: Normal range of motion and neck supple. No rigidity or tenderness.     Right lower leg: No edema.     Left lower leg: No edema.  Skin:    General: Skin is warm and dry.     Coloration: Skin is not jaundiced.     Findings: No erythema.  Neurological:     Mental Status: She is alert and oriented to person, place, and time. Mental status is at baseline.     Results for orders placed or performed in visit on 07/26/20  Lipid panel  Result  Value Ref Range   Cholesterol, Total 234 (H) 100 - 199 mg/dL   Triglycerides 355 (H) 0 - 149 mg/dL   HDL 42 >39 mg/dL   VLDL Cholesterol Cal 63 (H) 5 - 40 mg/dL   LDL Chol Calc (NIH) 129 (H) 0 - 99 mg/dL   Chol/HDL Ratio 5.6 (H) 0.0 - 4.4 ratio  CBC with Differential/Platelet  Result Value Ref Range   WBC 6.6 3.4 - 10.8 x10E3/uL   RBC 4.54 3.77 - 5.28 x10E6/uL   Hemoglobin 13.9 11.1 - 15.9 g/dL   Hematocrit 41.4 34.0 - 46.6 %   MCV 91 79 - 97 fL   MCH 30.6 26.6 - 33.0 pg   MCHC 33.6 31.5 - 35.7 g/dL   RDW 13.2 11.7 - 15.4 %   Platelets 183 150 - 450 x10E3/uL   Neutrophils 41 Not Estab. %   Lymphs 44 Not Estab. %   Monocytes 10 Not Estab. %   Eos 4 Not Estab. %   Basos 1 Not Estab. %   Neutrophils Absolute 2.7 1.4 - 7.0 x10E3/uL   Lymphocytes Absolute 2.9 0.7 - 3.1 x10E3/uL   Monocytes Absolute 0.7 0.1 - 0.9 x10E3/uL   EOS (ABSOLUTE) 0.3 0.0 - 0.4 x10E3/uL   Basophils Absolute 0.1 0.0 - 0.2 x10E3/uL   Immature Granulocytes 0 Not Estab. %   Immature Grans (Abs) 0.0 0.0 - 0.1 x10E3/uL  Bayer DCA Hb A1c Waived  Result Value Ref Range   HB A1C (BAYER DCA - WAIVED) 6.1 <7.0 %  Comprehensive metabolic panel  Result Value Ref Range   Glucose 113 (H) 65 - 99 mg/dL   BUN 23 8 - 27 mg/dL   Creatinine, Ser 0.91 0.57 - 1.00 mg/dL   eGFR 61 >59 mL/min/1.73   BUN/Creatinine Ratio 25 12 - 28   Sodium 139 134 - 144 mmol/L   Potassium 4.2 3.5 - 5.2 mmol/L   Chloride 100 96 - 106 mmol/L   CO2 21 20 - 29 mmol/L   Calcium 9.2 8.7 - 10.3 mg/dL   Total Protein 7.0 6.0 - 8.5 g/dL   Albumin 4.4 3.6 - 4.6 g/dL   Globulin, Total 2.6 1.5 - 4.5 g/dL   Albumin/Globulin Ratio 1.7 1.2 - 2.2   Bilirubin Total 0.4 0.0 - 1.2 mg/dL   Alkaline Phosphatase 76 44 - 121 IU/L   AST 42 (H) 0 - 40 IU/L   ALT 38 (H) 0 - 32 IU/L  Thyroid Panel With TSH  Result Value Ref Range   TSH 5.110 (H) 0.450 - 4.500 uIU/mL   T4, Total 9.2 4.5 - 12.0 ug/dL   T3 Uptake Ratio 26 24 - 39 %   Free Thyroxine Index 2.4  1.2 - 4.9  Current Outpatient Medications:  .  aspirin EC 81 MG tablet, Take 81 mg by mouth daily., Disp: , Rfl:  .  CALCIUM MAGNESIUM ZINC PO, Take 1 tablet by mouth daily., Disp: , Rfl:  .  carvedilol (COREG) 25 MG tablet, TAKE 1 TABLET (25 MG TOTAL) BY MOUTH 2 (TWO) TIMES DAILY WITH A MEAL., Disp: 180 tablet, Rfl: 4 .  cetirizine (ZYRTEC) 10 MG tablet, Take 10 mg by mouth daily as needed. , Disp: , Rfl:  .  COD LIVER OIL PO, Take by mouth daily. , Disp: , Rfl:  .  Coenzyme Q10 (COQ10) 200 MG CAPS, Take 1 capsule by mouth daily. , Disp: , Rfl:  .  COLLAGEN PO, Take by mouth., Disp: , Rfl:  .  hydrochlorothiazide (HYDRODIURIL) 25 MG tablet, Take 1 tablet (25 mg total) by mouth daily., Disp: 90 tablet, Rfl: 4 .  NON FORMULARY, daily. CBD oil and capsules, Disp: , Rfl:  .  pantoprazole (PROTONIX) 40 MG tablet, TAKE 1 TABLET BY MOUTH EVERY DAY, Disp: 90 tablet, Rfl: 0 .  TURMERIC CURCUMIN PO, Take by mouth daily., Disp: , Rfl:  .  VITAMIN A PO, Take 300 mg by mouth daily., Disp: , Rfl:  .  vitamin B-12 (CYANOCOBALAMIN) 500 MCG tablet, Take 500 mcg by mouth daily., Disp: , Rfl:  .  VITAMIN D, ERGOCALCIFEROL, PO, Take by mouth., Disp: , Rfl:     Assessment & Plan:  1. Neuropathy sec to ? Severe arthritis  Will start pt on PT  Referred to ortho   2. Prediabetes - Lifestyle modifications advised to pt. A1c at 6.1  Portion control and avoiding high carb low fat diet advised.  Diet plan given to pt  exercise plan given and encouraged.    To increase exercise to 150 mins a week ie 21/2 hours a week. Pt verbalises understanding of the above.   3. HLD ; will start pt on fenofibrate. TG high at 386  recheck FLP, check LFT's work on diet, SE of meds explained to pt. low fat and high fiber diet explained to pt.  Ref. Range 07/26/2020 08:14  Total CHOL/HDL Ratio Latest Ref Range: 0.0 - 4.4 ratio 5.6 (H)  Cholesterol, Total Latest Ref Range: 100 - 199 mg/dL 234 (H)  HDL Cholesterol Latest  Ref Range: >39 mg/dL 42  Triglycerides Latest Ref Range: 0 - 149 mg/dL 355 (H)  VLDL Cholesterol Cal Latest Ref Range: 5 - 40 mg/dL 63 (H)  LDL Chol Calc (NIH) Latest Ref Range: 0 - 99 mg/dL 129 (H)   4. Elevated TSH recheck in 6 weeks and if continues to be high will tx.     Ref. Range 07/26/2020 08:14  TSH Latest Ref Range: 0.450 - 4.500 uIU/mL 5.110 (H)  Thyroxine (T4) Latest Ref Range: 4.5 - 12.0 ug/dL 9.2  Free Thyroxine Index Latest Ref Range: 1.2 - 4.9  2.4  T3 Uptake Ratio Latest Ref Range: 24 - 39 % 26   Problem List Items Addressed This Visit   None      Follow up plan: Return in about 6 weeks (around 09/13/2020).

## 2020-08-05 ENCOUNTER — Encounter: Payer: Self-pay | Admitting: Internal Medicine

## 2020-08-14 DIAGNOSIS — M542 Cervicalgia: Secondary | ICD-10-CM | POA: Diagnosis not present

## 2020-08-14 DIAGNOSIS — M5459 Other low back pain: Secondary | ICD-10-CM | POA: Diagnosis not present

## 2020-08-19 ENCOUNTER — Other Ambulatory Visit: Payer: Self-pay | Admitting: Nurse Practitioner

## 2020-09-04 DIAGNOSIS — M545 Low back pain, unspecified: Secondary | ICD-10-CM | POA: Diagnosis not present

## 2020-09-04 DIAGNOSIS — M542 Cervicalgia: Secondary | ICD-10-CM | POA: Diagnosis not present

## 2020-09-06 ENCOUNTER — Other Ambulatory Visit: Payer: Medicare HMO

## 2020-09-06 ENCOUNTER — Other Ambulatory Visit: Payer: Self-pay

## 2020-09-06 DIAGNOSIS — E785 Hyperlipidemia, unspecified: Secondary | ICD-10-CM

## 2020-09-06 DIAGNOSIS — Z1329 Encounter for screening for other suspected endocrine disorder: Secondary | ICD-10-CM | POA: Diagnosis not present

## 2020-09-07 LAB — LIPID PANEL
Chol/HDL Ratio: 6.1 ratio — ABNORMAL HIGH (ref 0.0–4.4)
Cholesterol, Total: 243 mg/dL — ABNORMAL HIGH (ref 100–199)
HDL: 40 mg/dL (ref >39)
LDL Chol Calc (NIH): 101 mg/dL — ABNORMAL HIGH (ref 0–99)
Triglycerides: 601 mg/dL (ref 0–149)
VLDL Cholesterol Cal: 102 mg/dL — ABNORMAL HIGH (ref 5–40)

## 2020-09-07 LAB — T4, FREE: Free T4: 1.25 ng/dL (ref 0.82–1.77)

## 2020-09-07 LAB — TSH: TSH: 3.21 u[IU]/mL (ref 0.450–4.500)

## 2020-09-09 ENCOUNTER — Telehealth: Payer: Self-pay

## 2020-09-09 NOTE — Telephone Encounter (Signed)
Called patient, will be here tomorrow at 1pm, Patient will call with any confilcts

## 2020-09-10 ENCOUNTER — Ambulatory Visit: Payer: Self-pay | Admitting: Internal Medicine

## 2020-09-12 DIAGNOSIS — M542 Cervicalgia: Secondary | ICD-10-CM | POA: Diagnosis not present

## 2020-09-12 DIAGNOSIS — M545 Low back pain, unspecified: Secondary | ICD-10-CM | POA: Diagnosis not present

## 2020-09-13 ENCOUNTER — Other Ambulatory Visit: Payer: Self-pay

## 2020-09-13 ENCOUNTER — Encounter: Payer: Self-pay | Admitting: Internal Medicine

## 2020-09-13 ENCOUNTER — Ambulatory Visit (INDEPENDENT_AMBULATORY_CARE_PROVIDER_SITE_OTHER): Payer: Medicare HMO | Admitting: Internal Medicine

## 2020-09-13 VITALS — BP 111/67 | HR 60 | Temp 98.1°F | Ht 64.76 in | Wt 172.0 lb

## 2020-09-13 DIAGNOSIS — I2510 Atherosclerotic heart disease of native coronary artery without angina pectoris: Secondary | ICD-10-CM

## 2020-09-13 DIAGNOSIS — E781 Pure hyperglyceridemia: Secondary | ICD-10-CM

## 2020-09-13 MED ORDER — FENOFIBRATE 160 MG PO TABS: 160.0000 mg | ORAL_TABLET | Freq: Every day | ORAL | 5 refills | Status: DC

## 2020-09-13 NOTE — Progress Notes (Signed)
BP 111/67   Pulse 60   Temp 98.1 F (36.7 C) (Oral)   Ht 5' 4.76" (1.645 m)   Wt 172 lb (78 kg)   SpO2 96%   BMI 28.83 kg/m    Subjective:    Patient ID: Deborah Gibson, female    DOB: Dec 09, 1931, 87 y.o.   MRN: [id number]  Chief Complaint  Patient presents with  . Hyperlipidemia    Here today for HLD, Triglycerides are at 601  . Coronary Artery Disease  . Hypertension    HPI: Deborah Gibson is a 87 y.o. female  Hyperlipidemia Pertinent negatives include no chest pain, myalgias or shortness of breath.  Coronary Artery Disease Pertinent negatives include no chest pain, chest tightness, dizziness, leg swelling, palpitations or shortness of breath. Risk factors include hyperlipidemia and hypertension.  Hypertension Pertinent negatives include no chest pain, headaches, palpitations or shortness of breath.  Shoulder Pain  This is a new (aleve helps the pain) problem. Pertinent negatives include no fever or numbness.   Chief Complaint  Patient presents with  . Hyperlipidemia    Here today for HLD, Triglycerides are at 601  . Coronary Artery Disease  . Hypertension    Relevant past medical, surgical, family and social history reviewed and updated as indicated. Interim medical history since our last visit reviewed. Allergies and medications reviewed and updated.  Review of Systems  Constitutional:  Negative for activity change, appetite change, chills, fatigue and fever.  HENT:  Negative for congestion, ear discharge, ear pain and facial swelling.   Eyes:  Negative for pain and itching.  Respiratory:  Negative for cough, chest tightness, shortness of breath and wheezing.   Cardiovascular:  Negative for chest pain, palpitations and leg swelling.  Gastrointestinal:  Negative for abdominal distention, abdominal pain, blood in stool, constipation, diarrhea, nausea and vomiting.  Endocrine: Negative for cold intolerance, heat intolerance, polydipsia, polyphagia and  polyuria.  Genitourinary:  Negative for difficulty urinating, dysuria, flank pain, frequency, hematuria and urgency.  Musculoskeletal:  Negative for arthralgias, gait problem, joint swelling and myalgias.  Skin:  Negative for color change, rash and wound.  Neurological:  Negative for dizziness, tremors, speech difficulty, weakness, light-headedness, numbness and headaches.  Hematological:  Does not bruise/bleed easily.  Psychiatric/Behavioral:  Negative for agitation, confusion, decreased concentration, sleep disturbance and suicidal ideas.    Per HPI unless specifically indicated above     Objective:    BP 111/67   Pulse 60   Temp 98.1 F (36.7 C) (Oral)   Ht 5' 4.76" (1.645 m)   Wt 172 lb (78 kg)   SpO2 96%   BMI 28.83 kg/m   Wt Readings from Last 3 Encounters:  09/13/20 172 lb (78 kg)  08/02/20 173 lb (78.5 kg)  07/04/20 176 lb (79.8 kg)    Physical Exam Vitals and nursing note reviewed.  Constitutional:      General: She is not in acute distress.    Appearance: Normal appearance. She is not ill-appearing or diaphoretic.  HENT:     Head: Normocephalic and atraumatic.     Right Ear: Tympanic membrane and external ear normal. There is no impacted cerumen.     Left Ear: External ear normal.     Nose: No congestion or rhinorrhea.     Mouth/Throat:     Pharynx: No oropharyngeal exudate or posterior oropharyngeal erythema.  Eyes:     Conjunctiva/sclera: Conjunctivae normal.     Pupils: Pupils are equal, round, and reactive to  light.  Cardiovascular:     Rate and Rhythm: Normal rate and regular rhythm.     Heart sounds: No murmur heard.   No friction rub. No gallop.  Pulmonary:     Effort: No respiratory distress.     Breath sounds: No stridor. No wheezing or rhonchi.  Chest:     Chest wall: No tenderness.  Abdominal:     General: Abdomen is flat. Bowel sounds are normal. There is no distension.     Palpations: Abdomen is soft. There is no mass.     Tenderness: There  is no abdominal tenderness. There is no guarding.  Musculoskeletal:        General: No swelling or deformity.     Cervical back: Normal range of motion and neck supple. No rigidity or tenderness.     Right lower leg: No edema.     Left lower leg: No edema.  Skin:    General: Skin is warm and dry.     Coloration: Skin is not jaundiced.     Findings: No erythema.  Neurological:     Mental Status: She is alert and oriented to person, place, and time. Mental status is at baseline.   Results for orders placed or performed in visit on 09/06/20  T4, free  Result Value Ref Range   Free T4 1.25 0.82 - 1.77 ng/dL  Lipid panel  Result Value Ref Range   Cholesterol, Total 243 (H) 100 - 199 mg/dL   Triglycerides 601 (HH) 0 - 149 mg/dL   HDL 40 >39 mg/dL   VLDL Cholesterol Cal 102 (H) 5 - 40 mg/dL   LDL Chol Calc (NIH) 101 (H) 0 - 99 mg/dL   Chol/HDL Ratio 6.1 (H) 0.0 - 4.4 ratio  TSH  Result Value Ref Range   TSH 3.210 0.450 - 4.500 uIU/mL        Current Outpatient Medications:  .  aspirin EC 81 MG tablet, Take 81 mg by mouth daily., Disp: , Rfl:  .  CALCIUM MAGNESIUM ZINC PO, Take 1 tablet by mouth daily., Disp: , Rfl:  .  carvedilol (COREG) 25 MG tablet, TAKE 1 TABLET (25 MG TOTAL) BY MOUTH 2 (TWO) TIMES DAILY WITH A MEAL., Disp: 180 tablet, Rfl: 4 .  cetirizine (ZYRTEC) 10 MG tablet, Take 10 mg by mouth daily as needed. , Disp: , Rfl:  .  COD LIVER OIL PO, Take by mouth daily. , Disp: , Rfl:  .  Coenzyme Q10 (COQ10) 200 MG CAPS, Take 1 capsule by mouth daily. , Disp: , Rfl:  .  COLLAGEN PO, Take by mouth., Disp: , Rfl:  .  hydrochlorothiazide (HYDRODIURIL) 25 MG tablet, Take 1 tablet (25 mg total) by mouth daily., Disp: 90 tablet, Rfl: 4 .  NON FORMULARY, daily. CBD oil and capsules, Disp: , Rfl:  .  pantoprazole (PROTONIX) 40 MG tablet, TAKE 1 TABLET BY MOUTH EVERY DAY, Disp: 90 tablet, Rfl: 1 .  TURMERIC CURCUMIN PO, Take by mouth daily., Disp: , Rfl:  .  VITAMIN A PO, Take 300 mg  by mouth daily., Disp: , Rfl:  .  vitamin B-12 (CYANOCOBALAMIN) 500 MCG tablet, Take 500 mcg by mouth daily., Disp: , Rfl:  .  VITAMIN D, ERGOCALCIFEROL, PO, Take by mouth., Disp: , Rfl:     Assessment & Plan:  HLD : TG at 601 will start pt on fenofibrate. recheck FLP, check LFT's work on diet, SE of meds explained to pt. low fat and high  fiber diet explained to pt.  CAD : CATH - x 2 , last one 2016 with minimal luminal irregularities Sees Dr .Rockey Situ , given left shoulder/ chest pain and high TG pt to fu with cards.   Coronary artery disease involving native coronary artery of native heart without angina pectoris Plan: EKG 12-Lead  Shoulder pain : is getting PT for such and back and neck continue , posture, ice packs and NSAIDs for her neck  4. Moles on face / arms has had carcinoma per pts verbal record.  Problem List Items Addressed This Visit   None    No orders of the defined types were placed in this encounter.    No orders of the defined types were placed in this encounter.    Follow up plan: No follow-ups on file.

## 2020-09-27 DIAGNOSIS — M545 Low back pain, unspecified: Secondary | ICD-10-CM | POA: Diagnosis not present

## 2020-09-27 DIAGNOSIS — M7061 Trochanteric bursitis, right hip: Secondary | ICD-10-CM | POA: Diagnosis not present

## 2020-09-27 DIAGNOSIS — M542 Cervicalgia: Secondary | ICD-10-CM | POA: Diagnosis not present

## 2020-10-09 DIAGNOSIS — M545 Low back pain, unspecified: Secondary | ICD-10-CM | POA: Diagnosis not present

## 2020-10-09 DIAGNOSIS — M5416 Radiculopathy, lumbar region: Secondary | ICD-10-CM | POA: Diagnosis not present

## 2020-10-09 DIAGNOSIS — M542 Cervicalgia: Secondary | ICD-10-CM | POA: Diagnosis not present

## 2020-10-09 DIAGNOSIS — G894 Chronic pain syndrome: Secondary | ICD-10-CM | POA: Diagnosis not present

## 2020-10-09 DIAGNOSIS — M7061 Trochanteric bursitis, right hip: Secondary | ICD-10-CM | POA: Diagnosis not present

## 2020-10-11 ENCOUNTER — Other Ambulatory Visit: Payer: Self-pay | Admitting: Nurse Practitioner

## 2020-10-11 DIAGNOSIS — I10 Essential (primary) hypertension: Secondary | ICD-10-CM

## 2020-10-12 NOTE — Telephone Encounter (Signed)
Requested Prescriptions  Pending Prescriptions Disp Refills  . hydrochlorothiazide (HYDRODIURIL) 25 MG tablet [Pharmacy Med Name: HYDROCHLOROTHIAZIDE 25 MG TAB] 90 tablet 3    Sig: TAKE 1 TABLET BY MOUTH EVERY DAY     Cardiovascular: Diuretics - Thiazide Passed - 10/11/2020  4:33 PM      Passed - Ca in normal range and within 360 days    Calcium  Date Value Ref Range Status  07/26/2020 9.2 8.7 - 10.3 mg/dL Final   Calcium, Total  Date Value Ref Range Status  05/01/2014 9.9 8.5 - 10.1 mg/dL Final         Passed - Cr in normal range and within 360 days    Creatinine  Date Value Ref Range Status  05/01/2014 0.87 0.60 - 1.30 mg/dL Final   Creatinine, Ser  Date Value Ref Range Status  07/26/2020 0.91 0.57 - 1.00 mg/dL Final         Passed - K in normal range and within 360 days    Potassium  Date Value Ref Range Status  07/26/2020 4.2 3.5 - 5.2 mmol/L Final  05/01/2014 4.0 3.5 - 5.1 mmol/L Final         Passed - Na in normal range and within 360 days    Sodium  Date Value Ref Range Status  07/26/2020 139 134 - 144 mmol/L Final  05/01/2014 142 136 - 145 mmol/L Final         Passed - Last BP in normal range    BP Readings from Last 1 Encounters:  09/13/20 111/67         Passed - Valid encounter within last 6 months    Recent Outpatient Visits          4 weeks ago Pure hypertriglyceridemia   [place] Vigg, Avanti, MD   2 months ago Hyperlipidemia, unspecified hyperlipidemia type   [facility] Vigg, Avanti, MD   3 months ago Numbness and tingling   [facility] Vigg, Avanti, MD   3 months ago Essential hypertension   [place] Vigg, Avanti, MD   1 year ago Annual physical exam   [place], Barbaraann Faster, NP      Future Appointments            In 1 week Vigg, Avanti, MD [facility]   In 1 week Gollan, Kathlene November, MD [place]   In 7 months   [employer] [employer], [place]

## 2020-10-15 DIAGNOSIS — M542 Cervicalgia: Secondary | ICD-10-CM | POA: Diagnosis not present

## 2020-10-18 ENCOUNTER — Other Ambulatory Visit: Payer: Self-pay

## 2020-10-18 ENCOUNTER — Other Ambulatory Visit: Payer: Medicare HMO

## 2020-10-18 DIAGNOSIS — E785 Hyperlipidemia, unspecified: Secondary | ICD-10-CM

## 2020-10-18 DIAGNOSIS — E782 Mixed hyperlipidemia: Secondary | ICD-10-CM | POA: Diagnosis not present

## 2020-10-18 DIAGNOSIS — I10 Essential (primary) hypertension: Secondary | ICD-10-CM

## 2020-10-18 DIAGNOSIS — M5416 Radiculopathy, lumbar region: Secondary | ICD-10-CM | POA: Diagnosis not present

## 2020-10-19 LAB — COMPREHENSIVE METABOLIC PANEL
ALT: 27 IU/L (ref 0–32)
AST: 38 IU/L (ref 0–40)
Albumin/Globulin Ratio: 1.5 (ref 1.2–2.2)
Albumin: 4.3 g/dL (ref 3.6–4.6)
Alkaline Phosphatase: 59 IU/L (ref 44–121)
BUN/Creatinine Ratio: 18 (ref 12–28)
BUN: 22 mg/dL (ref 8–27)
Bilirubin Total: 0.6 mg/dL (ref 0.0–1.2)
CO2: 24 mmol/L (ref 20–29)
Calcium: 9.5 mg/dL (ref 8.7–10.3)
Chloride: 98 mmol/L (ref 96–106)
Creatinine, Ser: 1.22 mg/dL — ABNORMAL HIGH (ref 0.57–1.00)
Globulin, Total: 2.8 g/dL (ref 1.5–4.5)
Glucose: 112 mg/dL — ABNORMAL HIGH (ref 65–99)
Potassium: 5.4 mmol/L — ABNORMAL HIGH (ref 3.5–5.2)
Sodium: 138 mmol/L (ref 134–144)
Total Protein: 7.1 g/dL (ref 6.0–8.5)
eGFR: 43 mL/min/{1.73_m2} — ABNORMAL LOW (ref >59)

## 2020-10-19 LAB — CBC WITH DIFFERENTIAL/PLATELET
Basophils Absolute: 0 10*3/uL (ref 0.0–0.2)
Basos: 1 %
EOS (ABSOLUTE): 0.3 10*3/uL (ref 0.0–0.4)
Eos: 5 %
Hematocrit: 37.2 % (ref 34.0–46.6)
Hemoglobin: 12.3 g/dL (ref 11.1–15.9)
Immature Grans (Abs): 0 10*3/uL (ref 0.0–0.1)
Immature Granulocytes: 0 %
Lymphocytes Absolute: 2.4 10*3/uL (ref 0.7–3.1)
Lymphs: 39 %
MCH: 30.1 pg (ref 26.6–33.0)
MCHC: 33.1 g/dL (ref 31.5–35.7)
MCV: 91 fL (ref 79–97)
Monocytes Absolute: 0.5 10*3/uL (ref 0.1–0.9)
Monocytes: 8 %
Neutrophils Absolute: 3 10*3/uL (ref 1.4–7.0)
Neutrophils: 47 %
Platelets: 197 10*3/uL (ref 150–450)
RBC: 4.09 x10E6/uL (ref 3.77–5.28)
RDW: 13 % (ref 11.7–15.4)
WBC: 6.3 10*3/uL (ref 3.4–10.8)

## 2020-10-19 LAB — LIPID PANEL W/O CHOL/HDL RATIO
Cholesterol, Total: 248 mg/dL — ABNORMAL HIGH (ref 100–199)
HDL: 43 mg/dL (ref >39)
LDL Chol Calc (NIH): 131 mg/dL — ABNORMAL HIGH (ref 0–99)
Triglycerides: 409 mg/dL — ABNORMAL HIGH (ref 0–149)
VLDL Cholesterol Cal: 74 mg/dL — ABNORMAL HIGH (ref 5–40)

## 2020-10-21 NOTE — Progress Notes (Signed)
TG high still , is on fenofibrate , consider lipitor next visit x 2 days.

## 2020-10-25 ENCOUNTER — Encounter: Payer: Self-pay | Admitting: Cardiovascular Disease

## 2020-10-25 ENCOUNTER — Encounter: Payer: Self-pay | Admitting: Internal Medicine

## 2020-10-25 ENCOUNTER — Other Ambulatory Visit: Payer: Self-pay | Admitting: Cardiovascular Disease

## 2020-10-25 ENCOUNTER — Ambulatory Visit: Payer: Medicare HMO | Admitting: Cardiovascular Disease

## 2020-10-25 ENCOUNTER — Other Ambulatory Visit: Payer: Self-pay

## 2020-10-25 ENCOUNTER — Ambulatory Visit (INDEPENDENT_AMBULATORY_CARE_PROVIDER_SITE_OTHER): Payer: Medicare HMO | Admitting: Internal Medicine

## 2020-10-25 VITALS — BP 130/58 | HR 61 | Ht 65.0 in | Wt 167.4 lb

## 2020-10-25 VITALS — BP 116/68 | HR 62 | Temp 97.9°F | Ht 64.96 in | Wt 165.2 lb

## 2020-10-25 DIAGNOSIS — I6503 Occlusion and stenosis of bilateral vertebral arteries: Secondary | ICD-10-CM

## 2020-10-25 DIAGNOSIS — I10 Essential (primary) hypertension: Secondary | ICD-10-CM

## 2020-10-25 DIAGNOSIS — R7303 Prediabetes: Secondary | ICD-10-CM

## 2020-10-25 DIAGNOSIS — T466X5A Adverse effect of antihyperlipidemic and antiarteriosclerotic drugs, initial encounter: Secondary | ICD-10-CM

## 2020-10-25 DIAGNOSIS — M791 Myalgia, unspecified site: Secondary | ICD-10-CM

## 2020-10-25 DIAGNOSIS — E781 Pure hyperglyceridemia: Secondary | ICD-10-CM | POA: Diagnosis not present

## 2020-10-25 DIAGNOSIS — I2510 Atherosclerotic heart disease of native coronary artery without angina pectoris: Secondary | ICD-10-CM

## 2020-10-25 DIAGNOSIS — I25118 Atherosclerotic heart disease of native coronary artery with other forms of angina pectoris: Secondary | ICD-10-CM

## 2020-10-25 DIAGNOSIS — I739 Peripheral vascular disease, unspecified: Secondary | ICD-10-CM

## 2020-10-25 DIAGNOSIS — I6523 Occlusion and stenosis of bilateral carotid arteries: Secondary | ICD-10-CM

## 2020-10-25 MED ORDER — EZETIMIBE 10 MG PO TABS: 10.0000 mg | ORAL_TABLET | Freq: Every day | ORAL | 3 refills | Status: DC

## 2020-10-25 MED ORDER — REPATHA SURECLICK 140 MG/ML ~~LOC~~ SOAJ: 140.0000 mg | SUBCUTANEOUS | 11 refills | Status: DC

## 2020-10-25 NOTE — Progress Notes (Signed)
BP 116/68   Pulse 62   Temp 97.9 F (36.6 C) (Oral)   Ht 5' 4.96" (1.65 m)   Wt 165 lb 3.2 oz (74.9 kg)   SpO2 98%   BMI 27.52 kg/m    Subjective:    Patient ID: Deborah Gibson, female    DOB: March 01, 1932, 87 y.o.   MRN: [id number]  Chief Complaint  Patient presents with  . Hyperlipidemia  . Hypertension    HPI: Deborah Gibson is a 87 y.o. female  Hyperlipidemia This is a chronic problem. The problem is controlled. Pertinent negatives include no chest pain, focal sensory loss, focal weakness, leg pain, myalgias or shortness of breath.  Hypertension This is a chronic problem. The current episode started more than 1 year ago. The problem is controlled. Pertinent negatives include no anxiety, blurred vision, chest pain, malaise/fatigue, neck pain, peripheral edema, PND or shortness of breath.    Chief Complaint  Patient presents with  . Hyperlipidemia  . Hypertension    Relevant past medical, surgical, family and social history reviewed and updated as indicated. Interim medical history since our last visit reviewed. Allergies and medications reviewed and updated.  Review of Systems  Constitutional:  Negative for malaise/fatigue.  Eyes:  Negative for blurred vision.  Respiratory:  Negative for shortness of breath.   Cardiovascular:  Negative for chest pain and PND.  Musculoskeletal:  Negative for myalgias and neck pain.  Neurological:  Negative for focal weakness.   Per HPI unless specifically indicated above     Objective:    BP 116/68   Pulse 62   Temp 97.9 F (36.6 C) (Oral)   Ht 5' 4.96" (1.65 m)   Wt 165 lb 3.2 oz (74.9 kg)   SpO2 98%   BMI 27.52 kg/m   Wt Readings from Last 3 Encounters:  10/25/20 165 lb 3.2 oz (74.9 kg)  09/13/20 172 lb (78 kg)  08/02/20 173 lb (78.5 kg)    Physical Exam Vitals and nursing note reviewed.  Constitutional:      General: She is not in acute distress.    Appearance: Normal appearance. She is not ill-appearing  or diaphoretic.  Eyes:     Conjunctiva/sclera: Conjunctivae normal.  Pulmonary:     Breath sounds: No rhonchi.  Abdominal:     General: Abdomen is flat. Bowel sounds are normal. There is no distension.     Palpations: Abdomen is soft. There is no mass.     Tenderness: There is no abdominal tenderness. There is no guarding.  Skin:    General: Skin is warm and dry.     Coloration: Skin is not jaundiced.     Findings: No erythema.  Neurological:     Mental Status: She is alert.    Results for orders placed or performed in visit on 10/18/20  CBC with Differential/Platelet  Result Value Ref Range   WBC 6.3 3.4 - 10.8 x10E3/uL   RBC 4.09 3.77 - 5.28 x10E6/uL   Hemoglobin 12.3 11.1 - 15.9 g/dL   Hematocrit 61.6 32.8 - 46.6 %   MCV 91 79 - 97 fL   MCH 30.1 26.6 - 33.0 pg   MCHC 33.1 31.5 - 35.7 g/dL   RDW 28.9 61.0 - 35.1 %   Platelets 197 150 - 450 x10E3/uL   Neutrophils 47 Not Estab. %   Lymphs 39 Not Estab. %   Monocytes 8 Not Estab. %   Eos 5 Not Estab. %   Basos 1  Not Estab. %   Neutrophils Absolute 3.0 1.4 - 7.0 x10E3/uL   Lymphocytes Absolute 2.4 0.7 - 3.1 x10E3/uL   Monocytes Absolute 0.5 0.1 - 0.9 x10E3/uL   EOS (ABSOLUTE) 0.3 0.0 - 0.4 x10E3/uL   Basophils Absolute 0.0 0.0 - 0.2 x10E3/uL   Immature Granulocytes 0 Not Estab. %   Immature Grans (Abs) 0.0 0.0 - 0.1 x10E3/uL  Comprehensive metabolic panel  Result Value Ref Range   Glucose 112 (H) 65 - 99 mg/dL   BUN 22 8 - 27 mg/dL   Creatinine, Ser 1.22 (H) 0.57 - 1.00 mg/dL   eGFR 43 (L) >59 mL/min/1.73   BUN/Creatinine Ratio 18 12 - 28   Sodium 138 134 - 144 mmol/L   Potassium 5.4 (H) 3.5 - 5.2 mmol/L   Chloride 98 96 - 106 mmol/L   CO2 24 20 - 29 mmol/L   Calcium 9.5 8.7 - 10.3 mg/dL   Total Protein 7.1 6.0 - 8.5 g/dL   Albumin 4.3 3.6 - 4.6 g/dL   Globulin, Total 2.8 1.5 - 4.5 g/dL   Albumin/Globulin Ratio 1.5 1.2 - 2.2   Bilirubin Total 0.6 0.0 - 1.2 mg/dL   Alkaline Phosphatase 59 44 - 121 IU/L   AST 38  0 - 40 IU/L   ALT 27 0 - 32 IU/L  Lipid Panel w/o Chol/HDL Ratio  Result Value Ref Range   Cholesterol, Total 248 (H) 100 - 199 mg/dL   Triglycerides 409 (H) 0 - 149 mg/dL   HDL 43 >39 mg/dL   VLDL Cholesterol Cal 74 (H) 5 - 40 mg/dL   LDL Chol Calc (NIH) 131 (H) 0 - 99 mg/dL        Current Outpatient Medications:  .  aspirin EC 81 MG tablet, Take 81 mg by mouth daily., Disp: , Rfl:  .  CALCIUM MAGNESIUM ZINC PO, Take 1 tablet by mouth daily., Disp: , Rfl:  .  carvedilol (COREG) 25 MG tablet, TAKE 1 TABLET (25 MG TOTAL) BY MOUTH 2 (TWO) TIMES DAILY WITH A MEAL., Disp: 180 tablet, Rfl: 4 .  cetirizine (ZYRTEC) 10 MG tablet, Take 10 mg by mouth daily as needed. , Disp: , Rfl:  .  COD LIVER OIL PO, Take by mouth daily. , Disp: , Rfl:  .  Coenzyme Q10 (COQ10) 200 MG CAPS, Take 1 capsule by mouth daily. , Disp: , Rfl:  .  COLLAGEN PO, Take by mouth., Disp: , Rfl:  .  hydrochlorothiazide (HYDRODIURIL) 25 MG tablet, TAKE 1 TABLET BY MOUTH EVERY DAY, Disp: 90 tablet, Rfl: 3 .  NON FORMULARY, daily. CBD oil and capsules, Disp: , Rfl:  .  pantoprazole (PROTONIX) 40 MG tablet, TAKE 1 TABLET BY MOUTH EVERY DAY, Disp: 90 tablet, Rfl: 1 .  TURMERIC CURCUMIN PO, Take by mouth daily., Disp: , Rfl:  .  VITAMIN A PO, Take 300 mg by mouth daily., Disp: , Rfl:  .  vitamin B-12 (CYANOCOBALAMIN) 500 MCG tablet, Take 500 mcg by mouth daily., Disp: , Rfl:  .  VITAMIN D, ERGOCALCIFEROL, PO, Take by mouth., Disp: , Rfl:  .  fenofibrate 160 MG tablet, Take 1 tablet (160 mg total) by mouth daily. (Patient not taking: Reported on 10/25/2020), Disp: 30 tablet, Rfl: 5    Assessment & Plan:  HLD  Not taking fenofibrate  failed statins - sivastain/ crestor/ fenofibrate. recheck FLP, check LFT's work on diet, SE of meds explained to pt. low fat and high fiber diet explained to pt.  Ref. Range 09/06/2020 08:29 10/18/2020 10:25  Total CHOL/HDL Ratio Latest Ref Range: 0.0 - 4.4 ratio 6.1 (H)   Cholesterol, Total  Latest Ref Range: 100 - 199 mg/dL 243 (H) 248 (H)  HDL Cholesterol Latest Ref Range: >39 mg/dL 40 43  Triglycerides Latest Ref Range: 0 - 149 mg/dL 601 (HH) 409 (H)  VLDL Cholesterol Cal Latest Ref Range: 5 - 40 mg/dL 102 (H) 74 (H)  LDL Chol Calc (NIH) Latest Ref Range: 0 - 99 mg/dL 101 (H) 131 (H)    2. CAD :  Previous cardiac catheterization in 2016 with minimal luminal irregularities.  3.  Elevated Creatine: needs to increase water intake, pt has only 2 - 3 bottles of such   4. PVD (peripheral vascular disease) Minimal disease  Problem List Items Addressed This Visit   None    No orders of the defined types were placed in this encounter.    No orders of the defined types were placed in this encounter.    Follow up plan: No follow-ups on file.

## 2020-10-25 NOTE — Patient Instructions (Addendum)
Medication Instructions:  - Your physician has recommended you make the following change in your medication:   1) START Repatha 140 mg- place 1 injection into the skin every two week  If you need a refill on your cardiac medications before your next appointment, please call your pharmacy.    Lab work: No new labs needed   If you have labs (blood work) drawn today and your tests are completely normal, you will receive your results only by: [place] (if you have MyChart) OR A paper copy in the mail If you have any lab test that is abnormal or we need to change your treatment, we will call you to review the results.   Testing/Procedures:  1) Carotid duplex: Your physician has requested that you have a carotid duplex. This test is an ultrasound of the carotid arteries in your neck. It looks at blood flow through these arteries that supply the brain with blood. Allow one hour for this exam. There are no restrictions or special instructions.  2) Lower extremity arterial duplex: Your physician has requested that you have a lower extremity arterial duplex. This test is an ultrasound of the arteries in the legs. It looks at arterial blood flow in the legs. Allow one hour for Lower Arterial scans. There are no restrictions or special instructions    Follow-Up: At [facility], you and your health needs are our priority.  As part of our continuing mission to provide you with exceptional heart care, we have created designated Provider Care Teams.  These Care Teams include your primary Cardiologist (physician) and Advanced Practice Providers (APPs -  Physician Assistants and Nurse Practitioners) who all work together to provide you with the care you need, when you need it.  You will need a follow up appointment in 12 months  Providers on your designated Care Team:   Murray Hodgkins, NP Christell Faith, PA-C Marrianne Mood, PA-C Cadence Kathlen Mody, [place]  Any Other Special Instructions Will  Be Listed Below (If Applicable).  COVID-19 Vaccine Information can be found at: [email or web address].uk For questions related to vaccine distribution or appointments, please email [email or web address] or call [phone].    REPATHA (Evolocumab) injection What is this medication? EVOLOCUMAB (e voe LOK ue mab) treats high cholesterol. It is used with lifestyle changes, like diet and exercise. It is used alone or with othermedicines. This medicine may be used for other purposes; ask your health care provider orpharmacist if you have questions. COMMON BRAND NAME(S): Repatha What should I tell my care team before I take this medication? They need to know if you have any of these conditions: an unusual or allergic reaction to evolocumab, other medicines, latex, foods, dyes, or preservatives pregnant or trying to get pregnant breast-feeding How should I use this medication? This medicine is injected under the skin. You will be taught how to prepare and give it. Take it as directed on the prescription label at the same time everyday. Keep taking it unless your health care provider tells you to stop. It is important that you put your used needles and syringes in a special sharps container. Do not put them in a trash can. If you do not have a sharpscontainer, call your pharmacist or health care provider to get one. This medicine comes with INSTRUCTIONS FOR USE. Ask your pharmacist for directions on how to use this medicine. Read the information carefully. Talk toyour pharmacist or health care provider if you have questions. Talk to your health care provider about the use  of this medicine in children. While it may be prescribed for children as young as 10 for selected conditions,precautions do apply. Overdosage: If you think you have taken too much of this medicine contact apoison control center or emergency room at once. NOTE: This medicine is only  for you. Do not share this medicine with others. What if I miss a dose? It is important not to miss any doses. Talk to your health care provider aboutwhat to do if you miss a dose. What may interact with this medication? Interactions are not expected. This list may not describe all possible interactions. Give your health care provider a list of all the medicines, herbs, non-prescription drugs, or dietary supplements you use. Also tell them if you smoke, drink alcohol, or use illegaldrugs. Some items may interact with your medicine. What should I watch for while using this medication? Visit your health care provider for regular checks on your progress. Tell your health care provider if your symptoms do not start to get better or if they getworse. You may need blood work while you are taking this drug. Do not wear the on-body infuser during an MRI. Taking this drug is only part of a total heart healthy program. Your health care provider may give you a special diet to follow. Avoid alcohol. Avoidsmoking. Ask your health care provider how much you should exercise. What side effects may I notice from receiving this medication? Side effects that you should report to your doctor or health care provider assoon as possible: allergic reactions (skin rash, itching or hives; swelling of the face, lips, or tongue) high blood sugar (increased hunger, thirst, or urination; unusually weak or tired, blurry vision) infection (fever, chills, cough, sore throat, pain, or trouble passing urine) Side effects that usually do not require medical attention (report to yourdoctor or health care provider if they continue or are bothersome): diarrhea muscle pain nausea pain, redness, or irritation at site where injected This list may not describe all possible side effects. Call your doctor for medical advice about side effects. You may report side effects to FDA at1-800-FDA-1088. Where should I keep my medication? Keep  out of the reach of children and pets. Store in a refrigerator or at room temperature between 20 and 25 degrees C (68and 77 degrees F). Refrigeration (preferred): Store it in the refrigerator. Do not freeze. Keep it in the original carton until you are ready to take it. Remove the dose from the carton about 30 minutes before it is time for you to take it. Throw away anyunused medicine after the expiration date. Room temperature: This medicine may be stored at room temperature for up to 30 days. Keep it in the original carton until you are ready to take it. If it is stored at room temperature, throw away any unused medicine after 30 days orafter it expires, whichever is first. Protect from light. Do not shake. Avoid exposure to extreme heat. To get rid of medicines that are no longer needed or have expired: Take the medicine to a medicine take-back program. Check with your pharmacy or law enforcement to find a location. If you cannot return the medicine, ask your pharmacist or health care provider how to get rid of this medicine safely. NOTE: This sheet is a summary. It may not cover all possible information. If you have questions about this medicine, talk to your doctor, pharmacist, orhealth care provider.  2022 Elsevier/Gold Standard (2019-03-08 12:26:14)
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
## 2020-10-25 NOTE — Telephone Encounter (Signed)
Pharmacy is requesting an Alternative for Praluent.

## 2020-10-25 NOTE — Patient Instructions (Signed)
High Cholesterol  High cholesterol is a condition in which the blood has high levels of a white, waxy substance similar to fat (cholesterol). The liver makes all the cholesterol that the body needs. The human body needs small amounts of cholesterol to help build cells. A person gets extra orexcess cholesterol from the food that he or she eats. The blood carries cholesterol from the liver to the rest of the body. If you have high cholesterol, deposits (plaques) may build up on the walls of your arteries. Arteries are the blood vessels that carry blood away from your heart. These plaques make the arteries narrowand stiff. Cholesterol plaques increase your risk for heart attack and stroke. Work withyour health care provider to keep your cholesterol levels in a healthy range. What increases the risk? The following factors may make you more likely to develop this condition: Eating foods that are high in animal fat (saturated fat) or cholesterol. Being overweight. Not getting enough exercise. A family history of high cholesterol (familial hypercholesterolemia). Use of tobacco products. Having diabetes. What are the signs or symptoms? There are no symptoms of this condition. How is this diagnosed? This condition may be diagnosed based on the results of a blood test. If you are older than 85 years of age, your health care provider may check your cholesterol levels every 4-6 years. You may be checked more often if you have high cholesterol or other risk factors for heart disease. The blood test for cholesterol measures: "Bad" cholesterol, or LDL cholesterol. This is the main type of cholesterol that causes heart disease. The desired level is less than 100 mg/dL. "Good" cholesterol, or HDL cholesterol. HDL helps protect against heart disease by cleaning the arteries and carrying the LDL to the liver for processing. The desired level for HDL is 60 mg/dL or higher. Triglycerides. These are fats that your  body can store or burn for energy. The desired level is less than 150 mg/dL. Total cholesterol. This measures the total amount of cholesterol in your blood and includes LDL, HDL, and triglycerides. The desired level is less than 200 mg/dL. How is this treated? This condition may be treated with: Diet changes. You may be asked to eat foods that have more fiber and less saturated fats or added sugar. Lifestyle changes. These may include regular exercise, maintaining a healthy weight, and quitting use of tobacco products. Medicines. These are given when diet and lifestyle changes have not worked. You may be prescribed a statin medicine to help lower your cholesterol levels. Follow these instructions at home: Eating and drinking  Eat a healthy, balanced diet. This diet includes: Daily servings of a variety of fresh, frozen, or canned fruits and vegetables. Daily servings of whole grain foods that are rich in fiber. Foods that are low in saturated fats and trans fats. These include poultry and fish without skin, lean cuts of meat, and low-fat dairy products. A variety of fish, especially oily fish that contain omega-3 fatty acids. Aim to eat fish at least 2 times a week. Avoid foods and drinks that have added sugar. Use healthy cooking methods, such as roasting, grilling, broiling, baking, poaching, steaming, and stir-frying. Do not fry your food except for stir-frying.  Lifestyle  Get regular exercise. Aim to exercise for a total of 150 minutes a week. Increase your activity level by doing activities such as gardening, walking, and taking the stairs. Do not use any products that contain nicotine or tobacco, such as cigarettes, e-cigarettes, and chewing tobacco.   If you need help quitting, ask your health care provider.  General instructions Take over-the-counter and prescription medicines only as told by your health care provider. Keep all follow-up visits as told by your health care provider.  This is important. Where to find more information American Heart Association: www.heart.org National Heart, Lung, and Blood Institute: https://wilson-eaton.com/ Contact a health care provider if: You have trouble achieving or maintaining a healthy diet or weight. You are starting an exercise program. You are unable to stop smoking. Get help right away if: You have chest pain. You have trouble breathing. You have any symptoms of a stroke. "BE FAST" is an easy way to remember the main warning signs of a stroke: B - Balance. Signs are dizziness, sudden trouble walking, or loss of balance. E - Eyes. Signs are trouble seeing or a sudden change in vision. F - Face. Signs are sudden weakness or numbness of the face, or the face or eyelid drooping on one side. A - Arms. Signs are weakness or numbness in an arm. This happens suddenly and usually on one side of the body. S - Speech. Signs are sudden trouble speaking, slurred speech, or trouble understanding what people say. T - Time. Time to call emergency services. Write down what time symptoms started. You have other signs of a stroke, such as: A sudden, severe headache with no known cause. Nausea or vomiting. Seizure. These symptoms may represent a serious problem that is an emergency. Do not wait to see if the symptoms will go away. Get medical help right away. Call your local emergency services (911 in the [place] Do not drive yourself to the hospital. Summary Cholesterol plaques increase your risk for heart attack and stroke. Work with your health care provider to keep your cholesterol levels in a healthy range. Eat a healthy, balanced diet, get regular exercise, and maintain a healthy weight. Do not use any products that contain nicotine or tobacco, such as cigarettes, e-cigarettes, and chewing tobacco. Get help right away if you have any symptoms of a stroke. This information is not intended to replace advice given to you by your health care  provider. Make sure you discuss any questions you have with your healthcare provider. Document Revised: 02/13/2019 Document Reviewed: 02/13/2019 Elsevier Patient Education  2022 [employer]. Dyslipidemia Dyslipidemia is an imbalance of waxy, fat-like substances (lipids) in the blood. The body needs lipids in small amounts. Dyslipidemia often involves a high level of cholesterol or triglycerides, which are types oflipids. Common forms of dyslipidemia include: High levels of LDL cholesterol. LDL is the type of cholesterol that causes fatty deposits (plaques) to build up in the blood vessels that carry blood away from your heart (arteries). Low levels of HDL cholesterol. HDL cholesterol is the type of cholesterol that protects against heart disease. High levels of HDL remove the LDL buildup from arteries. High levels of triglycerides. Triglycerides are a fatty substance in the blood that is linked to a buildup of plaques in the arteries. What are the causes? Primary dyslipidemia is caused by changes (mutations) in genes that are passed down through families (inherited). These mutations cause several types of dyslipidemia. Secondary dyslipidemia is caused by lifestyle choices and diseases that lead to dyslipidemia, such as: Eating a diet that is high in animal fat. Not getting enough exercise. Having diabetes, kidney disease, liver disease, or thyroid disease. Drinking large amounts of alcohol. Using certain medicines. What increases the risk? You are more likely to develop this condition if you  are an older man or if you are a woman who has gone through menopause. Other risk factors include: Having a family history of dyslipidemia. Taking certain medicines, including birth control pills, steroids, some diuretics, and beta-blockers. Smoking cigarettes. Eating a high-fat diet. Having certain medical conditions such as diabetes, polycystic ovary syndrome (PCOS), kidney disease, liver disease, or  hypothyroidism. Not exercising regularly. Being overweight or obese with too much belly fat. What are the signs or symptoms? In most cases, dyslipidemia does not usually cause any symptoms. In severe cases, very high lipid levels can cause: Fatty bumps under the skin (xanthomas). White or gray ring around the black center (pupil) of the eye. Very high triglyceride levels can cause inflammation of the pancreas (pancreatitis). How is this diagnosed? Your health care provider may diagnose dyslipidemia based on a routine blood test (fasting blood test). Because most people do not have symptoms of the condition, this blood testing (lipid profile) is done on adults age 27 and older and is repeated every 5 years. This test checks: Total cholesterol. This measures the total amount of cholesterol in your blood, including LDL cholesterol, HDL cholesterol, and triglycerides. A healthy number is below 200. LDL cholesterol. The target number for LDL cholesterol is different for each person, depending on individual risk factors. Ask your health care provider what your LDL cholesterol should be. HDL cholesterol. An HDL level of 60 or higher is best because it helps to protect against heart disease. A number below 16 for men or below 20 for women increases the risk for heart disease. Triglycerides. A healthy triglyceride number is below 150. If your lipid profile is abnormal, your health care provider may do other bloodtests. How is this treated? Treatment depends on the type of dyslipidemia that you have and your other risk factors for heart disease and stroke. Your health care provider will have atarget range for your lipid levels based on this information. For many people, this condition may be treated by lifestyle changes, such as diet and exercise. Your health care provider may recommend that you: Get regular exercise. Make changes to your diet. Quit smoking if you smoke. If diet changes and exercise do  not help you reach your goals, your health care provider may also prescribe medicine to lower lipids. The most commonly prescribed type of medicine lowers your LDL cholesterol (statin drug). If you have a high triglyceride level, your provider may prescribe another type of drug (fibrate) or an omega-3 fish oil supplement, or both. Follow these instructions at home:  Eating and drinking Follow instructions from your health care provider or dietitian about eating or drinking restrictions. Eat a healthy diet as told by your health care provider. This can help you reach and maintain a healthy weight, lower your LDL cholesterol, and raise your HDL cholesterol. This may include: Limiting your calories, if you are overweight. Eating more fruits, vegetables, whole grains, fish, and lean meats. Limiting saturated fat, trans fat, and cholesterol. If you drink alcohol: Limit how much you use. Be aware of how much alcohol is in your drink. In the [place] one drink equals one 12 oz bottle of beer (355 mL), one 5 oz glass of wine (148 mL), or one 1 oz glass of hard liquor (44 mL). Do not drink alcohol if: Your health care provider tells you not to drink. You are pregnant, may be pregnant, or are planning to become pregnant. Activity Get regular exercise. Start an exercise and strength training program as told by  your health care provider. Ask your health care provider what activities are safe for you. Your health care provider may recommend: 30 minutes of aerobic activity 4-6 days a week. Brisk walking is an example of aerobic activity. Strength training 2 days a week. General instructions Do not use any products that contain nicotine or tobacco, such as cigarettes, e-cigarettes, and chewing tobacco. If you need help quitting, ask your health care provider. Take over-the-counter and prescription medicines only as told by your health care provider. This includes supplements. Keep all follow-up visits as told  by your health care provider. Contact a health care provider if: You are: Having trouble sticking to your exercise or diet plan. Struggling to quit smoking or control your use of alcohol. Summary Dyslipidemia often involves a high level of cholesterol or triglycerides, which are types of lipids. Treatment depends on the type of dyslipidemia that you have and your other risk factors for heart disease and stroke. For many people, treatment starts with lifestyle changes, such as diet and exercise. Your health care provider may prescribe medicine to lower lipids. This information is not intended to replace advice given to you by your health care provider. Make sure you discuss any questions you have with your healthcare provider. Document Revised: 11/08/2017 Document Reviewed: 10/15/2017 Elsevier Patient Education  Center.

## 2020-10-28 NOTE — Telephone Encounter (Signed)
Pral[place] changed to [place]

## 2020-12-06 ENCOUNTER — Ambulatory Visit: Payer: Medicare HMO | Admitting: Internal Medicine

## 2020-12-10 ENCOUNTER — Other Ambulatory Visit: Payer: Self-pay

## 2020-12-10 ENCOUNTER — Other Ambulatory Visit: Payer: Medicare HMO

## 2020-12-10 DIAGNOSIS — R8271 Bacteriuria: Secondary | ICD-10-CM | POA: Diagnosis not present

## 2020-12-10 DIAGNOSIS — E781 Pure hyperglyceridemia: Secondary | ICD-10-CM

## 2020-12-10 DIAGNOSIS — I2510 Atherosclerotic heart disease of native coronary artery without angina pectoris: Secondary | ICD-10-CM

## 2020-12-10 DIAGNOSIS — R7303 Prediabetes: Secondary | ICD-10-CM

## 2020-12-10 LAB — MICROSCOPIC EXAMINATION

## 2020-12-10 LAB — URINALYSIS, ROUTINE W REFLEX MICROSCOPIC
Bilirubin, UA: NEGATIVE
Glucose, UA: NEGATIVE
Ketones, UA: NEGATIVE
Nitrite, UA: NEGATIVE
Protein,UA: NEGATIVE
Specific Gravity, UA: 1.015 (ref 1.005–1.030)
Urobilinogen, Ur: 1 mg/dL (ref 0.2–1.0)
pH, UA: 6 (ref 5.0–7.5)

## 2020-12-10 LAB — MICROALBUMIN, URINE WAIVED
Creatinine, Urine Waived: 200 mg/dL (ref 10–300)
Microalb, Ur Waived: 30 mg/L — ABNORMAL HIGH (ref 0–19)
Microalb/Creat Ratio: <30 mg/g (ref <30)

## 2020-12-10 LAB — BAYER DCA HB A1C WAIVED: HB A1C (BAYER DCA - WAIVED): 6 % — ABNORMAL HIGH (ref 4.8–5.6)

## 2020-12-11 LAB — BASIC METABOLIC PANEL
BUN/Creatinine Ratio: 14 (ref 12–28)
BUN: 13 mg/dL (ref 8–27)
CO2: 25 mmol/L (ref 20–29)
Calcium: 9.1 mg/dL (ref 8.7–10.3)
Chloride: 100 mmol/L (ref 96–106)
Creatinine, Ser: 0.95 mg/dL (ref 0.57–1.00)
Glucose: 121 mg/dL — ABNORMAL HIGH (ref 65–99)
Potassium: 4.8 mmol/L (ref 3.5–5.2)
Sodium: 139 mmol/L (ref 134–144)
eGFR: 58 mL/min/{1.73_m2} — ABNORMAL LOW (ref >59)

## 2020-12-11 LAB — LIPID PANEL
Chol/HDL Ratio: 5.1 ratio — ABNORMAL HIGH (ref 0.0–4.4)
Cholesterol, Total: 198 mg/dL (ref 100–199)
HDL: 39 mg/dL — ABNORMAL LOW (ref >39)
LDL Chol Calc (NIH): 115 mg/dL — ABNORMAL HIGH (ref 0–99)
Triglycerides: 254 mg/dL — ABNORMAL HIGH (ref 0–149)
VLDL Cholesterol Cal: 44 mg/dL — ABNORMAL HIGH (ref 5–40)

## 2020-12-11 LAB — VITAMIN B12: Vitamin B-12: 1145 pg/mL (ref 232–1245)

## 2020-12-15 LAB — URINE CULTURE

## 2020-12-16 MED ORDER — SULFAMETHOXAZOLE-TRIMETHOPRIM 800-160 MG PO TABS: 1.0000 | ORAL_TABLET | Freq: Two times a day (BID) | ORAL | 0 refills | Status: AC

## 2020-12-16 NOTE — Progress Notes (Signed)
Pt has a UTI pl let her know seing in bactrim x 7 days. Thnx.

## 2020-12-17 ENCOUNTER — Other Ambulatory Visit: Payer: Self-pay

## 2020-12-17 ENCOUNTER — Ambulatory Visit (INDEPENDENT_AMBULATORY_CARE_PROVIDER_SITE_OTHER): Payer: Medicare HMO | Admitting: Internal Medicine

## 2020-12-17 ENCOUNTER — Ambulatory Visit
Admission: RE | Admit: 2020-12-17 | Discharge: 2020-12-17 | Disposition: A | Discharge status: Home / Self Care | Payer: Medicare HMO | Source: Ambulatory Visit | Attending: Internal Medicine | Admitting: Internal Medicine

## 2020-12-17 ENCOUNTER — Encounter: Payer: Self-pay | Admitting: Internal Medicine

## 2020-12-17 ENCOUNTER — Ambulatory Visit
Admission: RE | Admit: 2020-12-17 | Discharge: 2020-12-17 | Disposition: A | Discharge status: Home / Self Care | Payer: Medicare HMO | Attending: Internal Medicine | Admitting: Internal Medicine

## 2020-12-17 VITALS — BP 105/57 | HR 66 | Temp 97.9°F | Ht 64.8 in | Wt 165.6 lb

## 2020-12-17 DIAGNOSIS — Z87440 Personal history of urinary (tract) infections: Secondary | ICD-10-CM

## 2020-12-17 DIAGNOSIS — E785 Hyperlipidemia, unspecified: Secondary | ICD-10-CM

## 2020-12-17 DIAGNOSIS — I739 Peripheral vascular disease, unspecified: Secondary | ICD-10-CM

## 2020-12-17 DIAGNOSIS — R0989 Other specified symptoms and signs involving the circulatory and respiratory systems: Secondary | ICD-10-CM | POA: Diagnosis not present

## 2020-12-17 DIAGNOSIS — N390 Urinary tract infection, site not specified: Secondary | ICD-10-CM | POA: Diagnosis not present

## 2020-12-17 DIAGNOSIS — R7303 Prediabetes: Secondary | ICD-10-CM

## 2020-12-17 DIAGNOSIS — Z23 Encounter for immunization: Secondary | ICD-10-CM | POA: Diagnosis not present

## 2020-12-17 DIAGNOSIS — E781 Pure hyperglyceridemia: Secondary | ICD-10-CM | POA: Diagnosis not present

## 2020-12-17 DIAGNOSIS — I10 Essential (primary) hypertension: Secondary | ICD-10-CM

## 2020-12-17 DIAGNOSIS — Z1329 Encounter for screening for other suspected endocrine disorder: Secondary | ICD-10-CM | POA: Diagnosis not present

## 2020-12-17 NOTE — Progress Notes (Signed)
BP (!) 105/57   Pulse 66   Temp 97.9 F (36.6 C) (Oral)   Ht 5' 4.8" (1.646 m)   Wt 165 lb 9.6 oz (75.1 kg)   SpO2 96%   BMI 27.73 kg/m    Subjective:    Patient ID: Deborah Gibson, female    DOB: 1931-05-15, 87 y.o.   MRN: [id number]  Chief Complaint  Patient presents with   Hyperlipidemia   Hypertension    HPI: Deborah Gibson is a 87 y.o. female  Says she wears a pad.  Dizziness per pt worse when she is bowling goes once a week. Belongs to a group of senior citizens.   Hyperlipidemia This is a chronic problem. The current episode started more than 1 year ago. Pertinent negatives include no chest pain.  Hypertension This is a chronic problem. Pertinent negatives include no chest pain, palpitations or sweats. Past treatments include direct vasodilators.  Urinary Tract Infection  This is a recurrent problem. The current episode started more than 1 month ago. The problem has been gradually improving. Quality: feels like she has a tingling in the urethra. Pain scale: feels like shes passing stones or something per pt. The pain is mild. There has been no fever. Associated symptoms include frequency. Pertinent negatives include no chills, hematuria, hesitancy, nausea or sweats.   Chief Complaint  Patient presents with   Hyperlipidemia   Hypertension    Relevant past medical, surgical, family and social history reviewed and updated as indicated. Interim medical history since our last visit reviewed. Allergies and medications reviewed and updated.  Review of Systems  Constitutional:  Negative for chills.  Cardiovascular: Negative.  Negative for chest pain, palpitations and leg swelling.  Gastrointestinal:  Negative for abdominal distention, abdominal pain and nausea.  Genitourinary:  Positive for frequency. Negative for hematuria and hesitancy.   Per HPI unless specifically indicated above     Objective:    BP (!) 105/57   Pulse 66   Temp 97.9 F (36.6 C) (Oral)    Ht 5' 4.8" (1.646 m)   Wt 165 lb 9.6 oz (75.1 kg)   SpO2 96%   BMI 27.73 kg/m   Wt Readings from Last 3 Encounters:  12/17/20 165 lb 9.6 oz (75.1 kg)  10/25/20 167 lb 6 oz (75.9 kg)  10/25/20 165 lb 3.2 oz (74.9 kg)    Physical Exam Vitals and nursing note reviewed.  Constitutional:      General: She is not in acute distress.    Appearance: Normal appearance. She is not ill-appearing or diaphoretic.  Eyes:     Conjunctiva/sclera: Conjunctivae normal.  Cardiovascular:     Rate and Rhythm: Normal rate and regular rhythm.  Pulmonary:     Effort: Pulmonary effort is normal.     Breath sounds: Normal breath sounds. No rhonchi.  Abdominal:     General: Abdomen is flat. Bowel sounds are normal. There is no distension.     Palpations: Abdomen is soft. There is no mass.     Tenderness: There is no abdominal tenderness. There is no guarding.  Skin:    General: Skin is warm and dry.     Coloration: Skin is not jaundiced.     Findings: No erythema.  Neurological:     Mental Status: She is alert.    Results for orders placed or performed in visit on 12/10/20  Microscopic Examination   BLD  Result Value Ref Range   WBC, UA 6-10 (A) 0 -  5 /hpf   RBC 0-2 0 - 2 /hpf   Epithelial Cells (non renal) 0-10 0 - 10 /hpf   Mucus, UA Present (A) Not Estab.   Bacteria, UA Moderate (A) None seen/Few  Urine Culture   Specimen: Urine   UR  Result Value Ref Range   Urine Culture, Routine Final report (A)    Organism ID, Bacteria Escherichia coli (A)    Antimicrobial Susceptibility Comment   Lipid panel  Result Value Ref Range   Cholesterol, Total 198 100 - 199 mg/dL   Triglycerides 254 (H) 0 - 149 mg/dL   HDL 39 (L) >39 mg/dL   VLDL Cholesterol Cal 44 (H) 5 - 40 mg/dL   LDL Chol Calc (NIH) 115 (H) 0 - 99 mg/dL   Chol/HDL Ratio 5.1 (H) 0.0 - 4.4 ratio  Vitamin B12  Result Value Ref Range   Vitamin B-12 1,145 232 - 1,245 pg/mL  Basic metabolic panel  Result Value Ref Range   Glucose  121 (H) 65 - 99 mg/dL   BUN 13 8 - 27 mg/dL   Creatinine, Ser 0.95 0.57 - 1.00 mg/dL   eGFR 58 (L) >59 mL/min/1.73   BUN/Creatinine Ratio 14 12 - 28   Sodium 139 134 - 144 mmol/L   Potassium 4.8 3.5 - 5.2 mmol/L   Chloride 100 96 - 106 mmol/L   CO2 25 20 - 29 mmol/L   Calcium 9.1 8.7 - 10.3 mg/dL  Bayer DCA Hb A1c Waived  Result Value Ref Range   HB A1C (BAYER DCA - WAIVED) 6.0 (H) 4.8 - 5.6 %  Urinalysis, Routine w reflex microscopic  Result Value Ref Range   Specific Gravity, UA 1.015 1.005 - 1.030   pH, UA 6.0 5.0 - 7.5   Color, UA Yellow Yellow   Appearance Ur Cloudy (A) Clear   Leukocytes,UA 1+ (A) Negative   Protein,UA Negative Negative/Trace   Glucose, UA Negative Negative   Ketones, UA Negative Negative   RBC, UA Trace (A) Negative   Bilirubin, UA Negative Negative   Urobilinogen, Ur 1.0 0.2 - 1.0 mg/dL   Nitrite, UA Negative Negative   Microscopic Examination See below:   Microalbumin, Urine Waived  Result Value Ref Range   Microalb, Ur Waived 30 (H) 0 - 19 mg/L   Creatinine, Urine Waived 200 10 - 300 mg/dL   Microalb/Creat Ratio <30 <30 mg/g        Current Outpatient Medications:    aspirin EC 81 MG tablet, Take 81 mg by mouth daily., Disp: , Rfl:    carvedilol (COREG) 25 MG tablet, TAKE 1 TABLET (25 MG TOTAL) BY MOUTH 2 (TWO) TIMES DAILY WITH A MEAL., Disp: 180 tablet, Rfl: 4   cetirizine (ZYRTEC) 10 MG tablet, Take 10 mg by mouth daily as needed. , Disp: , Rfl:    COD LIVER OIL PO, Take by mouth daily. , Disp: , Rfl:    Coenzyme Q10 (COQ10) 200 MG CAPS, Take 1 capsule by mouth daily. , Disp: , Rfl:    COLLAGEN PO, Take by mouth., Disp: , Rfl:    hydrochlorothiazide (HYDRODIURIL) 25 MG tablet, TAKE 1 TABLET BY MOUTH EVERY DAY, Disp: 90 tablet, Rfl: 3   NON FORMULARY, daily. CBD oil and capsules, Disp: , Rfl:    pantoprazole (PROTONIX) 40 MG tablet, TAKE 1 TABLET BY MOUTH EVERY DAY, Disp: 90 tablet, Rfl: 1   sulfamethoxazole-trimethoprim (BACTRIM DS) 800-160  MG tablet, Take 1 tablet by mouth 2 (two) times daily  for 5 days., Disp: 10 tablet, Rfl: 0   TURMERIC CURCUMIN PO, Take by mouth daily., Disp: , Rfl:    VITAMIN A PO, Take 300 mg by mouth daily., Disp: , Rfl:    vitamin B-12 (CYANOCOBALAMIN) 500 MCG tablet, Take 500 mcg by mouth daily., Disp: , Rfl:    VITAMIN D, ERGOCALCIFEROL, PO, Take by mouth., Disp: , Rfl:    Evolocumab (REPATHA SURECLICK) 144 MG/ML SOAJ, Inject 1 mL into the skin every 14 (fourteen) days. (Patient not taking: Reported on 12/17/2020), Disp: 6 mL, Rfl: 3   fenofibrate 160 MG tablet, Take 1 tablet (160 mg total) by mouth daily. (Patient not taking: Reported on 12/17/2020), Disp: 30 tablet, Rfl: 5    Assessment & Plan:  HLD  Declines fenofibrate. Says sh egets pain in her legs. Results for THU, BAGGETT (MRN [id number]) as of 12/17/2020 10:28  Ref. Range 09/06/2020 08:29 10/18/2020 10:25 12/10/2020 08:58 12/10/2020 09:04  Total CHOL/HDL Ratio Latest Ref Range: 0.0 - 4.4 ratio 6.1 (H)   5.1 (H)  Cholesterol, Total Latest Ref Range: 100 - 199 mg/dL 243 (H) 248 (H)  198  HDL Cholesterol Latest Ref Range: >39 mg/dL 40 43  39 (L)  MICROALB/CREAT RATIO Latest Ref Range: <30 mg/g   <30   Triglycerides Latest Ref Range: 0 - 149 mg/dL 601 (HH) 409 (H)  254 (H)  VLDL Cholesterol Cal Latest Ref Range: 5 - 40 mg/dL 102 (H) 74 (H)  44 (H)  LDL Chol Calc (NIH) Latest Ref Range: 0 - 99 mg/dL 101 (H) 131 (H)  115 (H)    2. Prediabetes A1c 6.0  Lifestyle modifications advised to pt.   Portion control and avoiding high carb low fat diet advised.  Diet plan given to pt   exercise plan given and encouraged.  To increase exercise to 150 mins a week ie 21/2 hours a week. Pt verbalises understanding of the above.   PAD : LE arterial stenosis on CT in 2019 To have an [place] per cards fu and mx per them   Coronary artery disease cath in  2016 with minimal luminal irregularities   Carotid stenosis to be evaluated for dizziness with  To have a  carotid US for such  Fu and mx per cards.  Pt DOENST take Chol meds. TG and LDL NOT at goal. Understands the risks but isnt willing to take statins / fenofibrate to help lower Chol.   Recurrent UTI :  Will refer to urology for such has a problem with incontinence.  Check Xray for ? Nephrolithiasis Consider Ct abdomen.  Problem List Items Addressed This Visit   None Visit Diagnoses     Need for influenza vaccination    -  Primary   Relevant Orders   Flu Vaccine QUAD High Dose(Fluad) (Completed)        Orders Placed This Encounter  Procedures   Flu Vaccine QUAD High Dose(Fluad)     No orders of the defined types were placed in this encounter.    Follow up plan: No follow-ups on file.

## 2020-12-18 NOTE — Progress Notes (Signed)
Please let pt know this was normal.

## 2020-12-19 ENCOUNTER — Other Ambulatory Visit: Payer: Self-pay | Admitting: Cardiovascular Disease

## 2020-12-19 ENCOUNTER — Ambulatory Visit: Payer: Medicare HMO

## 2020-12-19 ENCOUNTER — Other Ambulatory Visit: Payer: Self-pay

## 2020-12-19 ENCOUNTER — Ambulatory Visit (INDEPENDENT_AMBULATORY_CARE_PROVIDER_SITE_OTHER): Payer: Medicare HMO

## 2020-12-19 DIAGNOSIS — I739 Peripheral vascular disease, unspecified: Secondary | ICD-10-CM

## 2020-12-19 DIAGNOSIS — I6523 Occlusion and stenosis of bilateral carotid arteries: Secondary | ICD-10-CM

## 2020-12-23 ENCOUNTER — Encounter: Payer: Self-pay | Admitting: Internal Medicine

## 2020-12-23 ENCOUNTER — Telehealth: Payer: Self-pay

## 2020-12-23 NOTE — Telephone Encounter (Signed)
Able to reach pt regarding her recent carotid US Dr. Rockey Situ had a chance to review her results and advised   "Carotid ultrasound  Right Carotid: Velocities in the right ICA are consistent with a 40-59% stenosis.   Left Carotid: Velocities in the left ICA are consistent with a 60-79% stenosis.   Desperately need to get cholesterol down, need to follow-up on Repatha prescription "  Mrs. Raybuck very thankful for the phone call of her results, has an appt with Dr. Fletcher Anon 9/29 to discuss her results in more detail. Currently pt is not using her Repatha as it was too expensive, reports wanted to wait until her appt to discus with Dr. Fletcher Anon about options.  Advised on [place] application she could try for and for possible $5 savings card. Pt was provided the [phone] number to call to see what she may qualify for. Otherwise all questions and concerns were address with nothing further at this time. Will see at next schedule f/u appt this Thursday.

## 2020-12-24 NOTE — Addendum Note (Signed)
Addended by: Georgina Peer on: 12/24/2020 11:57 AM   Modules accepted: Orders

## 2020-12-26 ENCOUNTER — Ambulatory Visit (INDEPENDENT_AMBULATORY_CARE_PROVIDER_SITE_OTHER): Payer: Medicare HMO | Admitting: Cardiovascular Disease

## 2020-12-26 ENCOUNTER — Encounter: Payer: Self-pay | Admitting: Cardiovascular Disease

## 2020-12-26 ENCOUNTER — Other Ambulatory Visit: Payer: Self-pay

## 2020-12-26 VITALS — BP 138/60 | HR 72 | Ht 65.5 in | Wt 163.5 lb

## 2020-12-26 DIAGNOSIS — E781 Pure hyperglyceridemia: Secondary | ICD-10-CM

## 2020-12-26 DIAGNOSIS — I779 Disorder of arteries and arterioles, unspecified: Secondary | ICD-10-CM

## 2020-12-26 DIAGNOSIS — I739 Peripheral vascular disease, unspecified: Secondary | ICD-10-CM | POA: Diagnosis not present

## 2020-12-26 DIAGNOSIS — I2510 Atherosclerotic heart disease of native coronary artery without angina pectoris: Secondary | ICD-10-CM | POA: Diagnosis not present

## 2020-12-26 NOTE — Patient Instructions (Addendum)
Medication Instructions:  Your physician recommends that you continue on your current medications as directed. Please refer to the Current Medication list given to you today.   Please call [phone]. They may be able to assist you with getting Repatha at a lower cost or for no cost.   *If you need a refill on your cardiac medications before your next appointment, please call your pharmacy*   Lab Work: None ordered If you have labs (blood work) drawn today and your tests are completely normal, you will receive your results only by: [place] (if you have MyChart) OR A paper copy in the mail If you have any lab test that is abnormal or we need to change your treatment, we will call you to review the results.   Testing/Procedures: None ordered   Follow-Up: At [facility], you and your health needs are our priority.  As part of our continuing mission to provide you with exceptional heart care, we have created designated Provider Care Teams.  These Care Teams include your primary Cardiologist (physician) and Advanced Practice Providers (APPs -  Physician Assistants and Nurse Practitioners) who all work together to provide you with the care you need, when you need it.  We recommend signing up for the patient portal called "MyChart".  Sign up information is provided on this After Visit Summary.  MyChart is used to connect with patients for Virtual Visits (Telemedicine).  Patients are able to view lab/test results, encounter notes, upcoming appointments, etc.  Non-urgent messages can be sent to your provider as well.   To learn more about what you can do with MyChart, go to [email or web address].    Your next appointment:   6 month(s)  The format for your next appointment:   In Person  Provider:   You may see Kathlyn Sacramento, MD N or one of the following Advanced Practice Providers on your designated Care Team:   Murray Hodgkins, NP Christell Faith, PA-C Marrianne Mood,  PA-C Cadence Kathlen Mody, [place]   Other Instructions N/A

## 2020-12-26 NOTE — Progress Notes (Signed)
Cardiology Office Note   Date:  12/26/2020   ID:  [name], DOB Jul 25, 1931, MRN [id number]  PCP:  Deborah Cousins, MD  Cardiologist:  Dr. Rockey Situ  Chief Complaint  Patient presents with   Other    Positive arterial/carotid. Meds reviewed verbally with pt.      History of Present Illness: Deborah Gibson is a 87 y.o. female who was referred by Dr. Rockey Situ for evaluation of peripheral arterial disease. She has known history of essential hypertension, previous tobacco use, nonobstructive coronary artery disease on previous cardiac catheterization 2016, GERD and hyperlipidemia. She had previous CTA of the lower extremities in 2019 which showed moderate bilateral SFA disease.  She reports bilateral calf claudication that is worse on the left than the right.  This happens mostly if she walks uphill.  If she walks on a flat level, the pain usually happens after walking 1 block and forces her to stop and rest before she can resume walking again.  She has no rest pain or lower extremity ulceration. She denies chest pain or shortness of breath.  Recent carotid Doppler was reviewed and showed 40 to 59% stenosis on the right and 60 to 79% on the left. Lower extremity arterial Doppler showed an ABI of 0.77 on the right and 0.66 on the left.  Duplex showed significant distal right SFA stenosis and significant left SFA/popliteal artery stenosis.  Past Medical History:  Diagnosis Date   Arthritis    Cancer ([place])    skin ca   GERD (gastroesophageal reflux disease)    Hyperlipidemia    Hypertension    Seasonal allergies     Past Surgical History:  Procedure Laterality Date   BLADDER SUSPENSION     CARDIAC CATHETERIZATION     [place], [place]   CATARACT EXTRACTION     CORONARY ANGIOPLASTY  03/30/2014   CYSTOCELE REPAIR     ESOPHAGOGASTRODUODENOSCOPY (EGD) WITH PROPOFOL N/A 03/12/2016   Procedure: ESOPHAGOGASTRODUODENOSCOPY (EGD) WITH PROPOFOL;  Surgeon: Deborah Bellows, MD;   Location: [facility] ENDOSCOPY;  Service: Endoscopy;  Laterality: N/A;   EYE SURGERY     cataract surgery   rectal vaginal fistula repair     TONSILLECTOMY     TOTAL ABDOMINAL HYSTERECTOMY     VAGINAL HYSTERECTOMY       Current Outpatient Medications  Medication Sig Dispense Refill   aspirin EC 81 MG tablet Take 81 mg by mouth daily.     carvedilol (COREG) 25 MG tablet TAKE 1 TABLET (25 MG TOTAL) BY MOUTH 2 (TWO) TIMES DAILY WITH A MEAL. 180 tablet 4   cetirizine (ZYRTEC) 10 MG tablet Take 10 mg by mouth daily as needed.      COD LIVER OIL PO Take by mouth daily.      Coenzyme Q10 (COQ10) 200 MG CAPS Take 1 capsule by mouth daily.      COLLAGEN PO Take by mouth.     hydrochlorothiazide (HYDRODIURIL) 25 MG tablet TAKE 1 TABLET BY MOUTH EVERY DAY 90 tablet 3   NON FORMULARY daily. CBD oil and capsules     pantoprazole (PROTONIX) 40 MG tablet TAKE 1 TABLET BY MOUTH EVERY DAY 90 tablet 1   TURMERIC CURCUMIN PO Take by mouth daily.     VITAMIN A PO Take 300 mg by mouth daily.     vitamin B-12 (CYANOCOBALAMIN) 500 MCG tablet Take 500 mcg by mouth daily.     VITAMIN D, ERGOCALCIFEROL, PO Take by mouth.  No current facility-administered medications for this visit.    Allergies:   Scopolamine, Amlodipine, Amlodipine, Benazepril, Codeine, Crestor [rosuvastatin], Lovastatin, Scopolamine, Statins, and Zocor [simvastatin]    Social History:  The patient  reports that she quit smoking about 42 years ago. Her smoking use included cigarettes. She has a 69.00 pack-year smoking history. She has never used smokeless tobacco. She reports that she does not drink alcohol and does not use drugs.   Family History:  The patient's family history includes Cancer in her sister; Diabetes in her brother; Heart attack in her brother; Heart disease in her brother, brother, father, and sister; Heart failure in her mother; Hyperlipidemia in her mother; Hypertension in her brother, mother, and sister; Leukemia in her  daughter; Stroke in her mother.    ROS:  Please see the history of present illness.   Otherwise, review of systems are positive for .   All other systems are reviewed and negative.    PHYSICAL EXAM: VS:  BP 138/60 (BP Location: Left Arm, Patient Position: Sitting, Cuff Size: Normal)   Pulse 72   Ht 5' 5.5" (1.664 m)   Wt 163 lb 8 oz (74.2 kg)   SpO2 98%   BMI 26.79 kg/m  , BMI Body mass index is 26.79 kg/m. GEN: Well nourished, well developed, in no acute distress  HEENT: normal  Neck: no JVD, carotid bruits, or masses Cardiac: RRR; no murmurs, rubs, or gallops,no edema  Respiratory:  clear to auscultation bilaterally, normal work of breathing GI: soft, nontender, nondistended, + BS MS: no deformity or atrophy  Skin: warm and dry, no rash Neuro:  Strength and sensation are intact Psych: euthymic mood, full affect Vascular: Femoral pulses: +2 bilaterally.  Distal pulses are not palpable.   EKG:  EKG is not ordered today.    Recent Labs: 09/06/2020: TSH 3.210 10/18/2020: ALT 27; Hemoglobin 12.3; Platelets 197 12/10/2020: BUN 13; Creatinine, Ser 0.95; Potassium 4.8; Sodium 139    Lipid Panel    Component Value Date/Time   CHOL 198 12/10/2020 0904   CHOL 229 (H) 09/14/2017 1019   TRIG 254 (H) 12/10/2020 0904   TRIG 379 (H) 09/14/2017 1019   HDL 39 (L) 12/10/2020 0904   CHOLHDL 5.1 (H) 12/10/2020 0904   CHOLHDL 5.0 06/30/2017 0700   VLDL 76 (H) 09/14/2017 1019   LDLCALC 115 (H) 12/10/2020 0904      Wt Readings from Last 3 Encounters:  12/26/20 163 lb 8 oz (74.2 kg)  12/17/20 165 lb 9.6 oz (75.1 kg)  10/25/20 167 lb 6 oz (75.9 kg)       No flowsheet data found.    ASSESSMENT AND PLAN:  1.  Peripheral arterial disease: Severe bilateral leg claudication worse on the left side due to SFA/popliteal disease.  I discussed with her the natural history and management of claudication.  This is not a limb threatening situation.  I discussed with her the importance of  controlling her risk factors.  I advised her to start a 30-minute daily walking program to improve her claudication.  If symptoms do not improve with conservative therapy, angiography and revascularization can be considered although it should be left as a last resort considering her age.  2.  Carotid disease: Discussed results of most recent carotid Doppler with her.  No indication for revascularization at this time.  Repeat study in 1 year.  3.  Hyperlipidemia: She has intolerance to statins due to myalgia.  She was started on Repatha by Dr. Rockey Situ  but has not started the medication due to concerns about cost.  We will try to get some financial assistance from the company.  4.  Nonobstructive coronary artery disease: Currently with no anginal symptoms.    Disposition:   FU with me in 6 months  Signed,  Kathlyn Sacramento, MD  12/26/2020 4:32 PM    [facility]

## 2020-12-29 ENCOUNTER — Emergency Department
Admission: EM | Admit: 2020-12-29 | Discharge: 2020-12-29 | Disposition: A | Discharge status: Home / Self Care | Payer: Medicare HMO | Attending: Emergency Medicine | Admitting: Emergency Medicine

## 2020-12-29 ENCOUNTER — Other Ambulatory Visit: Payer: Self-pay

## 2020-12-29 ENCOUNTER — Emergency Department: Payer: Medicare HMO

## 2020-12-29 DIAGNOSIS — I959 Hypotension, unspecified: Secondary | ICD-10-CM | POA: Diagnosis not present

## 2020-12-29 DIAGNOSIS — R55 Syncope and collapse: Secondary | ICD-10-CM | POA: Insufficient documentation

## 2020-12-29 DIAGNOSIS — Z85828 Personal history of other malignant neoplasm of skin: Secondary | ICD-10-CM | POA: Diagnosis not present

## 2020-12-29 DIAGNOSIS — I2510 Atherosclerotic heart disease of native coronary artery without angina pectoris: Secondary | ICD-10-CM | POA: Diagnosis not present

## 2020-12-29 DIAGNOSIS — Z7982 Long term (current) use of aspirin: Secondary | ICD-10-CM | POA: Diagnosis not present

## 2020-12-29 DIAGNOSIS — Z79899 Other long term (current) drug therapy: Secondary | ICD-10-CM | POA: Diagnosis not present

## 2020-12-29 DIAGNOSIS — Z743 Need for continuous supervision: Secondary | ICD-10-CM | POA: Diagnosis not present

## 2020-12-29 DIAGNOSIS — I10 Essential (primary) hypertension: Secondary | ICD-10-CM | POA: Diagnosis not present

## 2020-12-29 DIAGNOSIS — R110 Nausea: Secondary | ICD-10-CM | POA: Diagnosis not present

## 2020-12-29 DIAGNOSIS — Z87891 Personal history of nicotine dependence: Secondary | ICD-10-CM | POA: Diagnosis not present

## 2020-12-29 DIAGNOSIS — R42 Dizziness and giddiness: Secondary | ICD-10-CM | POA: Diagnosis not present

## 2020-12-29 LAB — BASIC METABOLIC PANEL
Anion gap: 13 (ref 5–15)
BUN: 24 mg/dL — ABNORMAL HIGH (ref 8–23)
CO2: 23 mmol/L (ref 22–32)
Calcium: 9.7 mg/dL (ref 8.9–10.3)
Chloride: 100 mmol/L (ref 98–111)
Creatinine, Ser: 1.07 mg/dL — ABNORMAL HIGH (ref 0.44–1.00)
GFR, Estimated: 50 mL/min — ABNORMAL LOW (ref >60)
Glucose, Bld: 125 mg/dL — ABNORMAL HIGH (ref 70–99)
Potassium: 4.1 mmol/L (ref 3.5–5.1)
Sodium: 136 mmol/L (ref 135–145)

## 2020-12-29 LAB — CBC
HCT: 36.4 % (ref 36.0–46.0)
Hemoglobin: 13 g/dL (ref 12.0–15.0)
MCH: 31.6 pg (ref 26.0–34.0)
MCHC: 35.7 g/dL (ref 30.0–36.0)
MCV: 88.3 fL (ref 80.0–100.0)
Platelets: 209 10*3/uL (ref 150–400)
RBC: 4.12 MIL/uL (ref 3.87–5.11)
RDW: 12.1 % (ref 11.5–15.5)
WBC: 10.1 10*3/uL (ref 4.0–10.5)
nRBC: 0 % (ref 0.0–0.2)

## 2020-12-29 LAB — URINALYSIS, COMPLETE (UACMP) WITH MICROSCOPIC
Bacteria, UA: NONE SEEN
Bilirubin Urine: NEGATIVE
Glucose, UA: NEGATIVE mg/dL
Hgb urine dipstick: NEGATIVE
Ketones, ur: 5 mg/dL — AB
Leukocytes,Ua: NEGATIVE
Nitrite: NEGATIVE
Protein, ur: NEGATIVE mg/dL
Specific Gravity, Urine: 1.014 (ref 1.005–1.030)
pH: 5 (ref 5.0–8.0)

## 2020-12-29 LAB — TROPONIN I (HIGH SENSITIVITY)
Troponin I (High Sensitivity): 4 ng/L (ref <18)
Troponin I (High Sensitivity): 5 ng/L (ref <18)

## 2020-12-29 MED ORDER — LACTATED RINGERS IV BOLUS
1000.0000 mL | Freq: Once | INTRAVENOUS | Status: AC
  Administered 2020-12-29: 1000 mL via INTRAVENOUS

## 2020-12-29 NOTE — ED Provider Notes (Signed)
[facility] Emergency Department Provider Note  ____________________________________________   Event Date/Time   First MD Initiated Contact with Patient 12/29/20 1313     (approximate)  I have reviewed the triage vital signs and the nursing notes.   HISTORY  Chief Complaint Near Syncope   HPI Deborah Gibson is a 87 y.o. female with a past medical history of HTN, HDL, GERD, arthritis, CAD, PVD, and remote tobacco abuse who presents for assessment after a syncopal episode that occurred earlier today while she was at church.  Patient states she thinks she may have gotten a little dehydrated and she not had enough to eat or drink today.  States she does not think she fell reportedly slid down from a seated position.  She denies any headache, earache, sore throat, acute chest pain, acute back pain, abdominal pain, nausea, vomiting, diarrhea, dysuria, rash or recent falls or injuries or other recent passing out episodes.  She does note she recently got over a urinary tract infection.  She also notes he has had some soreness on her left rib cage for several months on and off but is not any different today.  No recent falls or injuries.  No new medication changes.  No other acute concerns at this time.         Past Medical History:  Diagnosis Date   Arthritis    Cancer ([place])    skin ca   GERD (gastroesophageal reflux disease)    Hyperlipidemia    Hypertension    Seasonal allergies     Patient Active Problem List   Diagnosis Date Noted   Statin intolerance 05/31/2019   Vertebral artery stenosis 06/30/2017   Osteoarthritis of right hip 03/03/2017   Advanced care planning/counseling discussion 02/17/2017   Calculus of gallbladder without cholecystitis without obstruction 02/24/2016   PVD (peripheral vascular disease) ([place]) 01/29/2016   Hoarseness of voice 11/22/2014   Coronary artery disease 05/23/2014   Hyperlipidemia 05/01/2014   Family history of  early CAD 05/01/2014   Essential hypertension 05/01/2014   Smoking history 05/01/2014   Esophageal dysmotility 05/01/2014   Gastroesophageal reflux disease without esophagitis 05/01/2014    Past Surgical History:  Procedure Laterality Date   [place]   CATARACT EXTRACTION     CORONARY ANGIOPLASTY  03/30/2014   CYSTOCELE REPAIR     ESOPHAGOGASTRODUODENOSCOPY (EGD) WITH PROPOFOL N/A 03/12/2016   Procedure: ESOPHAGOGASTRODUODENOSCOPY (EGD) WITH PROPOFOL;  Surgeon: Jonathon Bellows, MD;  Location: [facility] ENDOSCOPY;  Service: Endoscopy;  Laterality: N/A;   EYE SURGERY     cataract surgery   rectal vaginal fistula repair     TONSILLECTOMY     TOTAL ABDOMINAL HYSTERECTOMY     VAGINAL HYSTERECTOMY      Prior to Admission medications   Medication Sig Start Date End Date Taking? Authorizing Provider  aspirin EC 81 MG tablet Take 81 mg by mouth daily.    [provider]  carvedilol (COREG) 25 MG tablet TAKE 1 TABLET (25 MG TOTAL) BY MOUTH 2 (TWO) TIMES DAILY WITH A MEAL. 07/24/20   Vigg, Avanti, MD  cetirizine (ZYRTEC) 10 MG tablet Take 10 mg by mouth daily as needed.     [provider]  COD LIVER OIL PO Take by mouth daily.     [provider]  Coenzyme Q10 (COQ10) 200 MG CAPS Take 1 capsule by mouth daily.     [provider]  COLLAGEN PO Take by mouth.    [provider]  hydrochlorothiazide (HYDRODIURIL) 25 MG tablet TAKE 1 TABLET BY MOUTH EVERY DAY 10/12/20   Charlynne Cousins, MD  NON FORMULARY daily. CBD oil and capsules    [provider]  pantoprazole (PROTONIX) 40 MG tablet TAKE 1 TABLET BY MOUTH EVERY DAY 08/19/20   Cannady, Henrine Screws T, NP  TURMERIC CURCUMIN PO Take by mouth daily.    [provider]  VITAMIN A PO Take 300 mg by mouth daily.    [provider]  vitamin B-12 (CYANOCOBALAMIN) 500 MCG tablet Take 500 mcg by mouth daily.    [provider]   VITAMIN D, ERGOCALCIFEROL, PO Take by mouth.    [provider]    Allergies Scopolamine, Amlodipine, Amlodipine, Benazepril, Codeine, Crestor [rosuvastatin], Lovastatin, Scopolamine, Statins, and Zocor [simvastatin]  Family History  Problem Relation Age of Onset   Hypertension Mother    Heart failure Mother    Hyperlipidemia Mother    Stroke Mother    Heart disease Father    Cancer Sister    Heart disease Sister    Hypertension Sister    Diabetes Brother    Heart disease Brother        pacer/defiib   Hypertension Brother    Heart attack Brother    Heart disease Brother        CABG   Leukemia Daughter     Social History Social History   Tobacco Use   Smoking status: Former    Packs/day: 3.00    Years: 23.00    Pack years: 69.00    Types: Cigarettes    Quit date: 04/15/1978    Years since quitting: 42.7   Smokeless tobacco: Never  Vaping Use   Vaping Use: Never used  Substance Use Topics   Alcohol use: No    Alcohol/week: 0.0 standard drinks   Drug use: No    Review of Systems  Review of Systems  Constitutional:  Negative for chills and fever.  HENT:  Negative for sore throat.   Eyes:  Negative for pain.  Respiratory:  Negative for cough and stridor.   Cardiovascular:  Positive for chest pain (L chest on/off for several months, not any different today).  Gastrointestinal:  Negative for vomiting.  Genitourinary:  Negative for dysuria.  Musculoskeletal:  Negative for myalgias.  Skin:  Negative for rash.  Neurological:  Positive for loss of consciousness. Negative for seizures and headaches.  Psychiatric/Behavioral:  Negative for suicidal ideas.   All other systems reviewed and are negative.    ____________________________________________   PHYSICAL EXAM:  VITAL SIGNS: ED Triage Vitals [12/29/20 1123]  Enc Vitals Group     BP (!) 176/56     Pulse Rate (!) 58     Resp 18     Temp 98 F (36.7 C)     Temp src      SpO2 100 %     Weight       Height      Head Circumference      Peak Flow      Pain Score 4     Pain Loc      Pain Edu?      Excl. in [place]?    Vitals:   12/29/20 1400 12/29/20 1500  BP: 137/90 (!) 184/64  Pulse:  65  Resp: 16 16  Temp:    SpO2:  98%   Physical Exam Vitals and nursing note reviewed.  Constitutional:      General: She is not in acute distress.    Appearance: She is well-developed.  HENT:     Head: Normocephalic and atraumatic.     Right Ear: External ear normal.     Left Ear: External ear normal.     Nose: Nose normal.     Mouth/Throat:     Mouth: Mucous membranes are dry.  Eyes:     Conjunctiva/sclera: Conjunctivae normal.  Cardiovascular:     Rate and Rhythm: Regular rhythm. Bradycardia present.     Heart sounds: No murmur heard. Pulmonary:     Effort: Pulmonary effort is normal. No respiratory distress.     Breath sounds: Normal breath sounds.  Abdominal:     Palpations: Abdomen is soft.     Tenderness: There is no abdominal tenderness.  Musculoskeletal:     Cervical back: Neck supple.  Skin:    General: Skin is warm and dry.     Capillary Refill: Capillary refill takes 2 to 3 seconds.  Neurological:     Mental Status: She is alert and oriented to person, place, and time.  Psychiatric:        Mood and Affect: Mood normal.    Cranial nerves II through XII grossly intact.  No pronator drift.  No finger dysmetria.  Symmetric 5/5 strength of all extremities.  Sensation intact to light touch in all extremities.    No obvious trauma to the extremities face scalp head or neck.  2+ radial pulse.  No obvious step-offs tenderness deformities over the C/T/L-spine.  Patient is ANO x3. ____________________________________________   LABS (all labs ordered are listed, but only abnormal results are displayed)  Labs Reviewed  BASIC METABOLIC PANEL - Abnormal; Notable for the following components:      Result Value   Glucose, Bld 125 (*)    BUN 24 (*)    Creatinine, Ser 1.07  (*)    GFR, Estimated 50 (*)    All other components within normal limits  CBC  URINALYSIS, COMPLETE (UACMP) WITH MICROSCOPIC  CBG MONITORING, ED  TROPONIN I (HIGH SENSITIVITY)  TROPONIN I (HIGH SENSITIVITY)   ____________________________________________  EKG  Bradycardia with ventricular to 58, normal axis, unremarkable intervals without clear evidence of acute ischemia or significant arrhythmia. ____________________________________________  RADIOLOGY  ED MD interpretation: Chest x-ray has no evidence of focal consolidation, effusion, edema, pneumothorax, rib fracture or any other acute thoracic process.  Official radiology report(s): DG Chest Portable 1 View  Result Date: 12/29/2020 CLINICAL DATA:  Syncopal episode with loss of consciousness today. Dizziness and nausea. EXAM: PORTABLE CHEST 1 VIEW COMPARISON:  07/22/2017 FINDINGS: Lungs are adequately inflated without focal airspace consolidation or effusion. No pneumothorax. Cardiomediastinal silhouette and remainder of the exam is unchanged. IMPRESSION: No active disease. Electronically Signed   By: Marin Olp M.D.   On: 12/29/2020 14:04    ____________________________________________   PROCEDURES  Procedure(s) performed (including Critical Care):  .1-3 Lead EKG Interpretation Performed by: Lucrezia Starch, MD Authorized by: Lucrezia Starch, MD     Interpretation: non-specific     ECG rate assessment: bradycardic     Rhythm: sinus bradycardia     Ectopy: none     ____________________________________________   INITIAL IMPRESSION / ASSESSMENT AND PLAN / ED COURSE      Patient presents with above-stated history and exam for assessment after a syncopal episode that occurred earlier today described above.  On arrival patient is slightly hypertensive and bradycardic at 58  with otherwise stable vital signs on room air.  She denies any acute complaints in the emergency room and has no evidence of trauma on exam.  She  also has a nonfocal neurological exam.  I have a low suspicion for CVA or preceding trauma from syncope.  No history of seizures or seizure-like activity witnessed.  Differential includes ACS, arrhythmia, metabolic derangements, anemia and dehydration patient does appear dry and states she has not been eating or drinking much today.  She has no chest pain or shortness of breath or evidence of tachycardia, tachypnea or hypoxia to suggest a PE.  I have a low suspicion for dissection   Chest x-ray has no evidence of focal consolidation, effusion, edema, pneumothorax, rib fracture or any other acute thoracic process.  ECG and initial troponin are not suggestive of ACS and overall Evalose suspicion is rate is etiology of the fall earlier today.  We will plan to obtain 2-hour repeat.  BMP shows no significant electrolyte or metabolic derangements.  CBC shows no leukocytosis or acute anemia.  Care patient signed over to oncoming fat approximately 1500.  Plan is to follow-up repeat troponin and UA.  If these are unremarkable patient is able to ambulate I think she can likely follow-up with her PCP.  We will plan to hydrate her in the meantime pending remainder of her work-up.     ____________________________________________   FINAL CLINICAL IMPRESSION(S) / ED DIAGNOSES  Final diagnoses:  Syncope and collapse    Medications  lactated ringers bolus 1,000 mL (1,000 mLs Intravenous New Bag/Given 12/29/20 1424)     ED Discharge Orders     None        Note:  This document was prepared using Dragon voice recognition software and may include unintentional dictation errors.    Lucrezia Starch, MD 12/29/20 [phone]

## 2020-12-29 NOTE — ED Triage Notes (Signed)
Pt comes with c/o near syncopal episode at church. Pt states she feels weak and dizzy. No LOC or hitting head

## 2020-12-29 NOTE — ED Provider Notes (Signed)
Emergency Medicine Provider Triage Evaluation Note  Deborah Gibson , a 87 y.o. female  was evaluated in triage.  Pt complains of passing out while at church.  She reports this occurred this morning.  She did not hit her head but thinks she may have lost consciousness for only a second or 2.  She has no history of hypoglycemia, diabetes or heart problems that she is aware of.  Review of Systems  Positive: Lightheadedness, shortness of breath, nausea and generalized weakness Negative: Headache, vision changes, URI symptoms, chest pain, vomiting or diarrhea  Physical Exam  There were no vitals taken for this visit. Gen:   Awake, appears unwell but in NAD ENT:   Normal EOM's Resp:  Normal effort.  No wheezing, rales or rhonchi noted. MSK:   Moves extremities without difficulty  Neuro:  Alert and oriented.  Medical Decision Making  Medically screening exam initiated at 11:23 AM.  Appropriate orders placed.  Deborah Gibson was informed that the remainder of the evaluation will be completed by another provider, this initial triage assessment does not replace that evaluation, and the importance of remaining in the ED until their evaluation is complete.    Jearld Fenton, NP 12/29/20 1125    Lucrezia Starch, MD 12/29/20 1524

## 2020-12-29 NOTE — ED Notes (Signed)
Pt aware of need for urine specimen collection, unable to provide at this time, IV fluids infusing

## 2020-12-29 NOTE — ED Notes (Signed)
RN to bedside to introduce self to pt. Pt advised she does not drink enough water and she is aware. Pt advised this has happened prior today 3 times. Pt has been seen for same. Pt advised she has anxiety as well and was told she has "clogged veins".

## 2020-12-29 NOTE — ED Triage Notes (Signed)
Pt in via EMS from church with c/o syncope. Pt had an episode at church. Per bystanders she was out for 1-2 minutes. Pt c/o dizziness and nausea. Denies pain. 136/97, HR 55, FSBS 139, 99% RA

## 2020-12-29 NOTE — Discharge Instructions (Addendum)
Please seek medical attention for any high fevers, chest pain, shortness of breath, change in behavior, persistent vomiting, bloody stool or any other new or concerning symptoms.  

## 2020-12-31 ENCOUNTER — Telehealth: Payer: Self-pay

## 2020-12-31 NOTE — Telephone Encounter (Signed)
Copied from [place] [phone]. Topic: General - Other >> Dec 30, 2020  2:23 PM Tessa Lerner A wrote: Reason for CRM: The patient called to share that they passed out at church yesterday 12/29/20  The patient was directed by ER staff to notify their PCP  The patient declined to schedule a follow up but called to notify their PCP as directed  Please contact if needed

## 2021-01-01 ENCOUNTER — Ambulatory Visit: Payer: Medicare HMO | Admitting: Urology

## 2021-01-01 ENCOUNTER — Other Ambulatory Visit: Payer: Self-pay

## 2021-01-01 ENCOUNTER — Encounter: Payer: Self-pay | Admitting: Urology

## 2021-01-01 VITALS — BP 138/74 | HR 78 | Ht 65.0 in | Wt 163.0 lb

## 2021-01-01 DIAGNOSIS — N3281 Overactive bladder: Secondary | ICD-10-CM

## 2021-01-01 DIAGNOSIS — N390 Urinary tract infection, site not specified: Secondary | ICD-10-CM | POA: Diagnosis not present

## 2021-01-01 LAB — BLADDER SCAN AMB NON-IMAGING: Scan Result: 79

## 2021-01-01 NOTE — Patient Instructions (Signed)
Take Over the counter D-Mannose daily

## 2021-01-01 NOTE — Progress Notes (Signed)
01/01/2021 2:22 PM   PREZLEY QADIR 03/03/1932 [id number]  Referring provider: Charlynne Cousins, MD [address] [place],  [place] [address]  Chief Complaint  Patient presents with   Recurrent UTI    HPI: Deborah Gibson is an 87 y.o. female referred for evaluation of recurrent UTIs.  States she has been treated for 3 UTIs over the last 3 months Urine cultures positive for E. coli April and September 2022 States her symptoms include nocturia every 2-3, urgency and bladder pressure She does have baseline frequency, urgency with occasional episodes of urge incontinence Presently her symptoms are not bothersome   PMH: Past Medical History:  Diagnosis Date   Arthritis    Cancer ([place])    skin ca   GERD (gastroesophageal reflux disease)    Hyperlipidemia    Hypertension    Seasonal allergies     Surgical History: Past Surgical History:  Procedure Laterality Date   BLADDER SUSPENSION     CARDIAC CATHETERIZATION     [place], [place]   CATARACT EXTRACTION     CORONARY ANGIOPLASTY  03/30/2014   CYSTOCELE REPAIR     ESOPHAGOGASTRODUODENOSCOPY (EGD) WITH PROPOFOL N/A 03/12/2016   Procedure: ESOPHAGOGASTRODUODENOSCOPY (EGD) WITH PROPOFOL;  Surgeon: Jonathon Bellows, MD;  Location: [facility] ENDOSCOPY;  Service: Endoscopy;  Laterality: N/A;   EYE SURGERY     cataract surgery   rectal vaginal fistula repair     TONSILLECTOMY     TOTAL ABDOMINAL HYSTERECTOMY     VAGINAL HYSTERECTOMY      Home Medications:  Allergies as of 01/01/2021       Reactions   Scopolamine Anaphylaxis   Amlodipine    Amlodipine Swelling   Benazepril Cough   Codeine    Sick on stomach   Crestor [rosuvastatin] Other (See Comments)   myalgias   Lovastatin Other (See Comments)   Myalgias   Scopolamine    Statins    Legs ache   Zocor [simvastatin] Other (See Comments)   myalgias        Medication List        Accurate as of January 01, 2021  2:22 PM. If you have any questions, ask your nurse or  doctor.          aspirin EC 81 MG tablet Take 81 mg by mouth daily.   carvedilol 25 MG tablet Commonly known as: COREG TAKE 1 TABLET (25 MG TOTAL) BY MOUTH 2 (TWO) TIMES DAILY WITH A MEAL.   cetirizine 10 MG tablet Commonly known as: ZYRTEC Take 10 mg by mouth daily as needed.   COD LIVER OIL PO Take by mouth daily.   COLLAGEN PO Take by mouth.   CoQ10 200 MG Caps Take 1 capsule by mouth daily.   hydrochlorothiazide 25 MG tablet Commonly known as: HYDRODIURIL TAKE 1 TABLET BY MOUTH EVERY DAY   NON FORMULARY daily. CBD oil and capsules   pantoprazole 40 MG tablet Commonly known as: PROTONIX TAKE 1 TABLET BY MOUTH EVERY DAY   TURMERIC CURCUMIN PO Take by mouth daily.   VITAMIN A PO Take 300 mg by mouth daily.   vitamin B-12 500 MCG tablet Commonly known as: CYANOCOBALAMIN Take 500 mcg by mouth daily.   VITAMIN D (ERGOCALCIFEROL) PO Take by mouth.        Allergies:  Allergies  Allergen Reactions   Scopolamine Anaphylaxis   Amlodipine    Amlodipine Swelling   Benazepril Cough   Codeine     Sick on stomach  Crestor [Rosuvastatin] Other (See Comments)    myalgias   Lovastatin Other (See Comments)    Myalgias    Scopolamine    Statins     Legs ache    Zocor [Simvastatin] Other (See Comments)    myalgias    Family History: Family History  Problem Relation Age of Onset   Hypertension Mother    Heart failure Mother    Hyperlipidemia Mother    Stroke Mother    Heart disease Father    Cancer Sister    Heart disease Sister    Hypertension Sister    Diabetes Brother    Heart disease Brother        pacer/defiib   Hypertension Brother    Heart attack Brother    Heart disease Brother        CABG   Leukemia Daughter     Social History:  reports that she quit smoking about 42 years ago. Her smoking use included cigarettes. She has a 69.00 pack-year smoking history. She has never used smokeless tobacco. She reports that she does not  drink alcohol and does not use drugs.   Physical Exam: BP 138/74   Pulse 78   Ht 5\' 5"  (1.651 m)   Wt 163 lb (73.9 kg)   BMI 27.12 kg/m   Constitutional:  Alert and oriented, No acute distress. HEENT: [place] AT, moist mucus membranes.  Trachea midline, no masses. Cardiovascular: No clubbing, cyanosis, or edema. Respiratory: Normal respiratory effort, no increased work of breathing. Psychiatric: Normal mood and affect.  Laboratory Data:  Urinalysis 01/01/2021: Dipstick trace leukocytes/microscopy negative   Assessment & Plan:    1.  Recurrent UTI UA today clear We discussed the evaluation and treatment of patients with recurrent UTIs at length.  We specifically discussed the differences between asymptomatic bacteriuria and true urinary tract infection.  We discussed the AUA definition of recurrent UTI of at least 2 culture proven symptomatic acute cystitis episodes in a 22-month period, or 3 within a 1 year period.  We discussed the importance of culture directed antibiotic treatment, and antibiotic stewardship.  First-line therapy includes nitrofurantoin(5 days), Bactrim(3 days), or fosfomycin(3 g single dose).  Possible etiologies of recurrent infection include periurethral tissue atrophy in postmenopausal woman, constipation, sexual activity, incomplete emptying, anatomic abnormalities, and even genetic predisposition.  Finally, we discussed the role of perineal hygiene, timed voiding, adequate hydration, topical vaginal estrogen, cranberry prophylaxis, and low-dose antibiotic prophylaxis. Recommend she call here for an acute visit for recurrent UTI symptoms  2.  Overactive bladder Baseline OAB symptoms though not bothersome enough that she desires medical management    Abbie Sons, MD  [facility] [address], [place], [place] [address] [phone]

## 2021-01-02 LAB — URINALYSIS, COMPLETE
Bilirubin, UA: NEGATIVE
Glucose, UA: NEGATIVE
Ketones, UA: NEGATIVE
Nitrite, UA: NEGATIVE
Protein,UA: NEGATIVE
RBC, UA: NEGATIVE
Specific Gravity, UA: 1.015 (ref 1.005–1.030)
Urobilinogen, Ur: 0.2 mg/dL (ref 0.2–1.0)
pH, UA: 6 (ref 5.0–7.5)

## 2021-01-02 LAB — MICROSCOPIC EXAMINATION: Bacteria, UA: NONE SEEN

## 2021-01-03 ENCOUNTER — Encounter: Payer: Self-pay | Admitting: Urology

## 2021-01-06 ENCOUNTER — Telehealth: Payer: Self-pay

## 2021-01-06 NOTE — Telephone Encounter (Signed)
Able to reach pt regarding her recent Lower extremity arterial Doppler  Dr. Rockey Situ had a chance to review her results and advised   "Lower extremity arterial Doppler  Moderate to severe disease in the lower extremities bilaterally  Several lesions concerning for 50 to 74% stenosis femoral arteries  Would recommend referral to Dr. Fletcher Anon for consultation of her claudication symptoms "  Deborah Gibson very thankful for the phone call of her results, has a schedule appt with Dr. Fletcher Anon in March, will route to scheduling for sooner appt d/t concern, pt agrees. Otherwise all questions and concerns were address with nothing further at this time. Will see at next schedule f/u appt.   Schedule 10/13 at 3:20 with Dr. Fletcher Anon

## 2021-01-09 ENCOUNTER — Ambulatory Visit (INDEPENDENT_AMBULATORY_CARE_PROVIDER_SITE_OTHER): Payer: Medicare HMO | Admitting: Cardiovascular Disease

## 2021-01-09 ENCOUNTER — Encounter: Payer: Self-pay | Admitting: Cardiovascular Disease

## 2021-01-09 ENCOUNTER — Other Ambulatory Visit: Payer: Self-pay

## 2021-01-09 VITALS — BP 134/60 | HR 66 | Ht 64.0 in | Wt 164.0 lb

## 2021-01-09 DIAGNOSIS — I739 Peripheral vascular disease, unspecified: Secondary | ICD-10-CM | POA: Diagnosis not present

## 2021-01-09 DIAGNOSIS — E781 Pure hyperglyceridemia: Secondary | ICD-10-CM

## 2021-01-09 DIAGNOSIS — R55 Syncope and collapse: Secondary | ICD-10-CM | POA: Diagnosis not present

## 2021-01-09 DIAGNOSIS — I10 Essential (primary) hypertension: Secondary | ICD-10-CM

## 2021-01-09 MED ORDER — LOSARTAN POTASSIUM 25 MG PO TABS: 25.0000 mg | ORAL_TABLET | Freq: Every day | ORAL | 5 refills | Status: DC

## 2021-01-09 NOTE — Patient Instructions (Signed)
Medication Instructions:  Your physician has recommended you make the following change in your medication:   1- STOP Hydrochlorothiazide  2- START Losartan 25 mg daily. An Rx has been sent to your pharmacy   *If you need a refill on your cardiac medications before your next appointment, please call your pharmacy*   Lab Work: Your physician recommends that you return for lab work (bmp) in: 1 week  If you have labs (blood work) drawn today and your tests are completely normal, you will receive your results only by: MyChart Message (if you have MyChart) OR A paper copy in the mail If you have any lab test that is abnormal or we need to change your treatment, we will call you to review the results.   Testing/Procedures: None ordered   Follow-Up: At [facility], you and your health needs are our priority.  As part of our continuing mission to provide you with exceptional heart care, we have created designated Provider Care Teams.  These Care Teams include your primary Cardiologist (physician) and Advanced Practice Providers (APPs -  Physician Assistants and Nurse Practitioners) who all work together to provide you with the care you need, when you need it.  We recommend signing up for the patient portal called "MyChart".  Sign up information is provided on this After Visit Summary.  MyChart is used to connect with patients for Virtual Visits (Telemedicine).  Patients are able to view lab/test results, encounter notes, upcoming appointments, etc.  Non-urgent messages can be sent to your provider as well.   To learn more about what you can do with MyChart, go to [email or web address].    Your next appointment:   As planned   The format for your next appointment:   6 months  Provider:   You may see Kathlyn Sacramento, MD or one of the following Advanced Practice Providers on your designated Care Team:   Murray Hodgkins, NP Christell Faith, PA-C Marrianne Mood, PA-C Cadence Kathlen Mody,  [place]   Other Instructions EXERCISE PROGRAM FOR INDIVIDUALS WITH  PERIPHERAL ARTERIAL DISEASE (PAD)   General Information:   Research in vascular exercise has demonstrated remarkable improvement in symptoms of leg pain (claudication) without expensive or invasive interventions. Regular walking programs are extremely helpful for patients with PAD and intermittent claudication.  These steps are designed to help you get started with a safe and effective program to help you walk farther with less pain:   Walk at least three times a week (preferably every day).  Your goal is to build up to 30-45 minutes of total walking time (not counting rest breaks). It may take you several weeks to build up your exercise time starting at 5-10 minutes or whatever you can tolerate.  Walk as far as possible using moderate to maximal pain (7-8 on the scale below) as a signal to stop, and resume walking when the pain goes away.  On a treadmill, set the speed and grade at a level that brings on the claudication pain within 3 to 5 minutes. Walk at this rate until you experience claudication of moderate severity, rest until the pain improves, and then resume walking.  Over time, you will be able to walk longer at the designated speed and grade; workload should then be increased until you develop the pain within 3 to 5 minutes once again.  This regimen will induce a significant benefit. Studies have demonstrated that participants may be able to walk up to three or four times farther and have less  leg pain, within twelve weeks, by following this protocol.  Pain Scale    0_____1_____2_____3_____4_____5_____6_____7_____8_____9_____10   No Pain                                   Moderate Pain                               Maximal Pain

## 2021-01-09 NOTE — Progress Notes (Signed)
Cardiology Office Note   Date:  01/09/2021   ID:  Deborah Gibson, DOB 12-17-1931, MRN [id number]  PCP:  Charlynne Cousins, MD  Cardiologist:  Dr. Rockey Situ  Chief Complaint  Patient presents with   OTHER    ED Syncope/ Discuss Claudication. Meds reviewed verbally with pt.      History of Present Illness: Deborah Gibson is a 87 y.o. female who is here today for follow-up visit regarding peripheral arterial disease.   She has known history of essential hypertension, previous tobacco use, nonobstructive coronary artery disease on previous cardiac catheterization 2016, GERD and hyperlipidemia. She had previous CTA of the lower extremities in 2019 which showed moderate bilateral SFA disease.  She was seen recently for bilateral calf claudication that is worse on the left than the right.  This happens mostly if she walks uphill.  If she walks on a flat level, the pain usually happens after walking 1 block and forces her to stop and rest before she can resume walking again.  She has no rest pain or lower extremity ulceration. Recent carotid Doppler was reviewed and showed 40 to 59% stenosis on the right and 60 to 79% on the left. Lower extremity arterial Doppler showed an ABI of 0.77 on the right and 0.66 on the left.  Duplex showed significant distal right SFA stenosis and significant left SFA/popliteal artery stenosis.  I recommended starting with a walking exercise program and reserving angiography and endovascular intervention for refractory symptoms.  She reports that her symptoms are stable.  She was not short recently and had a syncopal episode.  She was singing and felt very hot, clammy and dizzy followed by brief loss of consciousness.  She felt that she did not drink enough fluids that day.  No chest pain or shortness of breath.  She was taken to the emergency department.  Her labs were unremarkable except for mildly elevated BUN and creatinine above baseline.  UTI was negative.   Troponin was normal.  She was mildly bradycardic on presentation in the mid 50s.  She reports no recurrent episodes.  Past Medical History:  Diagnosis Date   Arthritis    Cancer ([place])    skin ca   GERD (gastroesophageal reflux disease)    Hyperlipidemia    Hypertension    Seasonal allergies     Past Surgical History:  Procedure Laterality Date   BLADDER SUSPENSION     CARDIAC CATHETERIZATION     [place], [place]   CATARACT EXTRACTION     CORONARY ANGIOPLASTY  03/30/2014   CYSTOCELE REPAIR     ESOPHAGOGASTRODUODENOSCOPY (EGD) WITH PROPOFOL N/A 03/12/2016   Procedure: ESOPHAGOGASTRODUODENOSCOPY (EGD) WITH PROPOFOL;  Surgeon: Jonathon Bellows, MD;  Location: [facility] ENDOSCOPY;  Service: Endoscopy;  Laterality: N/A;   EYE SURGERY     cataract surgery   rectal vaginal fistula repair     TONSILLECTOMY     TOTAL ABDOMINAL HYSTERECTOMY     VAGINAL HYSTERECTOMY       Current Outpatient Medications  Medication Sig Dispense Refill   aspirin EC 81 MG tablet Take 81 mg by mouth daily.     carvedilol (COREG) 25 MG tablet TAKE 1 TABLET (25 MG TOTAL) BY MOUTH 2 (TWO) TIMES DAILY WITH A MEAL. 180 tablet 4   cetirizine (ZYRTEC) 10 MG tablet Take 10 mg by mouth daily as needed.      COD LIVER OIL PO Take by mouth daily.      Coenzyme Q10 (COQ10) 200  MG CAPS Take 1 capsule by mouth daily.      COLLAGEN PO Take by mouth.     hydrochlorothiazide (HYDRODIURIL) 25 MG tablet TAKE 1 TABLET BY MOUTH EVERY DAY 90 tablet 3   NON FORMULARY daily. CBD oil and capsules     pantoprazole (PROTONIX) 40 MG tablet TAKE 1 TABLET BY MOUTH EVERY DAY 90 tablet 1   TURMERIC CURCUMIN PO Take by mouth daily.     VITAMIN A PO Take 300 mg by mouth daily.     vitamin B-12 (CYANOCOBALAMIN) 500 MCG tablet Take 500 mcg by mouth daily.     VITAMIN D, ERGOCALCIFEROL, PO Take by mouth.     No current facility-administered medications for this visit.    Allergies:   Scopolamine, Amlodipine, Amlodipine, Benazepril, Codeine,  Crestor [rosuvastatin], Lovastatin, Scopolamine, Statins, and Zocor [simvastatin]    Social History:  The patient  reports that she quit smoking about 42 years ago. Her smoking use included cigarettes. She has a 69.00 pack-year smoking history. She has never used smokeless tobacco. She reports that she does not drink alcohol and does not use drugs.   Family History:  The patient's family history includes Cancer in her sister; Diabetes in her brother; Heart attack in her brother; Heart disease in her brother, brother, father, and sister; Heart failure in her mother; Hyperlipidemia in her mother; Hypertension in her brother, mother, and sister; Leukemia in her daughter; Stroke in her mother.    ROS:  Please see the history of present illness.   Otherwise, review of systems are positive for .   All other systems are reviewed and negative.    PHYSICAL EXAM: VS:  BP 134/60 (BP Location: Left Arm, Patient Position: Sitting, Cuff Size: Normal)   Pulse 66   Ht 5\' 4"  (1.626 m)   Wt 164 lb (74.4 kg)   SpO2 97%   BMI 28.15 kg/m  , BMI Body mass index is 28.15 kg/m. GEN: Well nourished, well developed, in no acute distress  HEENT: normal  Neck: no JVD, carotid bruits, or masses Cardiac: RRR; no murmurs, rubs, or gallops,no edema  Respiratory:  clear to auscultation bilaterally, normal work of breathing GI: soft, nontender, nondistended, + BS MS: no deformity or atrophy  Skin: warm and dry, no rash Neuro:  Strength and sensation are intact Psych: euthymic mood, full affect Vascular: Femoral pulses: +2 bilaterally.  Distal pulses are not palpable.   EKG:  EKG is ordered today. EKG showed normal sinus rhythm without significant ST or T wave changes.   Recent Labs: 09/06/2020: TSH 3.210 10/18/2020: ALT 27 12/29/2020: BUN 24; Creatinine, Ser 1.07; Hemoglobin 13.0; Platelets 209; Potassium 4.1; Sodium 136    Lipid Panel    Component Value Date/Time   CHOL 198 12/10/2020 0904   CHOL 229 (H)  09/14/2017 1019   TRIG 254 (H) 12/10/2020 0904   TRIG 379 (H) 09/14/2017 1019   HDL 39 (L) 12/10/2020 0904   CHOLHDL 5.1 (H) 12/10/2020 0904   CHOLHDL 5.0 06/30/2017 0700   VLDL 76 (H) 09/14/2017 1019   LDLCALC 115 (H) 12/10/2020 0904      Wt Readings from Last 3 Encounters:  01/09/21 164 lb (74.4 kg)  01/01/21 163 lb (73.9 kg)  12/26/20 163 lb 8 oz (74.2 kg)       No flowsheet data found.    ASSESSMENT AND PLAN:  1.  Peripheral arterial disease: Severe bilateral leg claudication worse on the left side due to SFA/popliteal disease.  Her symptoms are overall stable and will continue with a walking exercise program for now.    2.  Carotid disease: Discussed results of most recent carotid Doppler with her.  No indication for revascularization at this time.  Repeat study in 1 year.  3.  Hyperlipidemia: She has intolerance to statins due to myalgia.  She was started on Repatha by Dr. Rockey Situ but has not started the medication due to concerns about cost.  We will try to get some financial assistance from the company.  4.  Nonobstructive coronary artery disease: Currently with no anginal symptoms.  5.  Recent syncope: Likely due to orthostatic hypotension in the setting of mild volume depletion noted on her labs.  She is still orthostatic today.  This is also in the setting of bradycardia caused by carvedilol and volume depletion likely worsened by hydrochlorothiazide.  I elected to discontinue hydrochlorothiazide and switch her to losartan 25 mg daily instead.  Check basic metabolic profile in 1 week.  If she continues to have episodes of dizziness, consider decreasing the dose of carvedilol.    Disposition:   FU with me in 6 months  Signed,  Kathlyn Sacramento, MD  01/09/2021 3:35 PM    [facility]

## 2021-01-16 ENCOUNTER — Other Ambulatory Visit (INDEPENDENT_AMBULATORY_CARE_PROVIDER_SITE_OTHER): Payer: Medicare HMO

## 2021-01-16 ENCOUNTER — Other Ambulatory Visit: Payer: Self-pay

## 2021-01-16 DIAGNOSIS — I10 Essential (primary) hypertension: Secondary | ICD-10-CM

## 2021-01-17 LAB — BASIC METABOLIC PANEL
BUN/Creatinine Ratio: 15 (ref 12–28)
BUN: 13 mg/dL (ref 8–27)
CO2: 22 mmol/L (ref 20–29)
Calcium: 9.3 mg/dL (ref 8.7–10.3)
Chloride: 97 mmol/L (ref 96–106)
Creatinine, Ser: 0.84 mg/dL (ref 0.57–1.00)
Glucose: 106 mg/dL — ABNORMAL HIGH (ref 70–99)
Potassium: 4.5 mmol/L (ref 3.5–5.2)
Sodium: 135 mmol/L (ref 134–144)
eGFR: 67 mL/min/{1.73_m2} (ref >59)

## 2021-01-31 ENCOUNTER — Encounter: Payer: Self-pay | Admitting: Emergency Medicine

## 2021-01-31 ENCOUNTER — Emergency Department
Admission: EM | Admit: 2021-01-31 | Discharge: 2021-01-31 | Disposition: A | Discharge status: Home / Self Care | Payer: Medicare HMO | Attending: Emergency Medicine | Admitting: Emergency Medicine

## 2021-01-31 ENCOUNTER — Emergency Department: Payer: Medicare HMO

## 2021-01-31 ENCOUNTER — Telehealth: Payer: Self-pay | Admitting: Cardiovascular Disease

## 2021-01-31 ENCOUNTER — Other Ambulatory Visit: Payer: Self-pay

## 2021-01-31 DIAGNOSIS — Z87891 Personal history of nicotine dependence: Secondary | ICD-10-CM | POA: Diagnosis not present

## 2021-01-31 DIAGNOSIS — R079 Chest pain, unspecified: Secondary | ICD-10-CM | POA: Diagnosis not present

## 2021-01-31 DIAGNOSIS — I700 Atherosclerosis of aorta: Secondary | ICD-10-CM | POA: Diagnosis not present

## 2021-01-31 DIAGNOSIS — R519 Headache, unspecified: Secondary | ICD-10-CM | POA: Diagnosis not present

## 2021-01-31 DIAGNOSIS — I2510 Atherosclerotic heart disease of native coronary artery without angina pectoris: Secondary | ICD-10-CM | POA: Insufficient documentation

## 2021-01-31 DIAGNOSIS — Z85828 Personal history of other malignant neoplasm of skin: Secondary | ICD-10-CM | POA: Diagnosis not present

## 2021-01-31 DIAGNOSIS — R42 Dizziness and giddiness: Secondary | ICD-10-CM | POA: Diagnosis not present

## 2021-01-31 DIAGNOSIS — R0789 Other chest pain: Secondary | ICD-10-CM | POA: Diagnosis not present

## 2021-01-31 DIAGNOSIS — Z7982 Long term (current) use of aspirin: Secondary | ICD-10-CM | POA: Insufficient documentation

## 2021-01-31 DIAGNOSIS — I10 Essential (primary) hypertension: Secondary | ICD-10-CM | POA: Diagnosis not present

## 2021-01-31 DIAGNOSIS — Z79899 Other long term (current) drug therapy: Secondary | ICD-10-CM | POA: Insufficient documentation

## 2021-01-31 DIAGNOSIS — R4182 Altered mental status, unspecified: Secondary | ICD-10-CM | POA: Diagnosis not present

## 2021-01-31 LAB — CBC
HCT: 38.8 % (ref 36.0–46.0)
Hemoglobin: 13.3 g/dL (ref 12.0–15.0)
MCH: 30.4 pg (ref 26.0–34.0)
MCHC: 34.3 g/dL (ref 30.0–36.0)
MCV: 88.8 fL (ref 80.0–100.0)
Platelets: 220 10*3/uL (ref 150–400)
RBC: 4.37 MIL/uL (ref 3.87–5.11)
RDW: 12.1 % (ref 11.5–15.5)
WBC: 5.2 10*3/uL (ref 4.0–10.5)
nRBC: 0 % (ref 0.0–0.2)

## 2021-01-31 LAB — BASIC METABOLIC PANEL
Anion gap: 9 (ref 5–15)
BUN: 12 mg/dL (ref 8–23)
CO2: 27 mmol/L (ref 22–32)
Calcium: 9.3 mg/dL (ref 8.9–10.3)
Chloride: 99 mmol/L (ref 98–111)
Creatinine, Ser: 0.64 mg/dL (ref 0.44–1.00)
GFR, Estimated: >60 mL/min (ref >60)
Glucose, Bld: 143 mg/dL — ABNORMAL HIGH (ref 70–99)
Potassium: 3.7 mmol/L (ref 3.5–5.1)
Sodium: 135 mmol/L (ref 135–145)

## 2021-01-31 LAB — TROPONIN I (HIGH SENSITIVITY)
Troponin I (High Sensitivity): 4 ng/L (ref <18)
Troponin I (High Sensitivity): 4 ng/L (ref <18)

## 2021-01-31 LAB — LIPASE, BLOOD: Lipase: 29 U/L (ref 11–51)

## 2021-01-31 MED ORDER — MECLIZINE HCL 25 MG PO TABS
25.0000 mg | ORAL_TABLET | Freq: Once | ORAL | Status: AC
  Administered 2021-01-31: 25 mg via ORAL
  Filled 2021-01-31: qty 1

## 2021-01-31 MED ORDER — MECLIZINE HCL 25 MG PO TABS
25.0000 mg | ORAL_TABLET | Freq: Three times a day (TID) | ORAL | 0 refills | Status: DC | PRN
Start: 2021-01-31 — End: 2021-06-24

## 2021-01-31 MED ORDER — BUTALBITAL-APAP-CAFFEINE 50-325-40 MG PO TABS
2.0000 | ORAL_TABLET | Freq: Once | ORAL | Status: AC
  Administered 2021-01-31: 2 via ORAL
  Filled 2021-01-31: qty 2

## 2021-01-31 NOTE — Discharge Instructions (Signed)
Use Tylenol for pain and fevers.  Up to 1000 mg per dose, up to 4 times per day.  Do not take more than 4000 mg of Tylenol/acetaminophen within 24 hours..  Please use lidocaine patches and your site of pain.  Apply 1 patch at a time, leave on for 12 hours, then remove for 12 hours.  12 hours on, 12 hours off.  Do not apply more than 1 patch at a time.  

## 2021-01-31 NOTE — Telephone Encounter (Signed)
Pt c/o medication issue:  1. Name of Medication: losartan   2. How are you currently taking this medication (dosage and times per day)? 25 MG 1 tablet daily   3. Are you having a reaction (difficulty breathing--STAT)? Mainly dizziness, BP high, little pressure in chest   4. What is your medication issue? Patient calling, has taking this medication for 2 days and has been extremely dizzy.  Please call to discuss.

## 2021-01-31 NOTE — ED Provider Notes (Addendum)
[facility] Emergency Department Provider Note ____________________________________________   Event Date/Time   First MD Initiated Contact with Patient 01/31/21 1212     (approximate)  I have reviewed the triage vital signs and the nursing notes.  HISTORY  Chief Complaint Chest Pain   HPI Deborah Gibson is a 87 y.o. femalewho presents to the ED for evaluation of chest pain.   Chart review indicates [facility].  Hx HTN, previous tobacco abuse, nonobstructive CAD on 2016 LHC, GERD and HLD. PAD  Patient presents to the ED, accompanied by her son, for evaluation of dizziness and chest pain.  She is primarily reporting vertiginous dizziness that is positional and intermittent over the past 2 days.  She reports feeling "just bad."  Reports worsening vertigo when she turns her head quickly to the left, improved with lying still.  Denies any falls, syncopal episodes, trauma, gait changes or extremity weakness or sensation changes.   She reports that she "always has chest pain with my vertigo."  She reports pain consistently to her lower chest, wrapping around her left-sided lateral chest wall.  Her son reports that she "always has chest pain.  "They seem to minimize this despite it being her chief complaint initially.  She is much more concerned about her vertigo.  She has felt vertigo before and is used to the situation, but reports that she has been out of her meclizine "for a while."  Past Medical History:  Diagnosis Date   Arthritis    Cancer ([place])    skin ca   GERD (gastroesophageal reflux disease)    Hyperlipidemia    Hypertension    Seasonal allergies     Patient Active Problem List   Diagnosis Date Noted   Statin intolerance 05/31/2019   Vertebral artery stenosis 06/30/2017   Osteoarthritis of right hip 03/03/2017   Advanced care planning/counseling discussion 02/17/2017   Calculus of gallbladder without cholecystitis without obstruction  02/24/2016   PVD (peripheral vascular disease) ([place]) 01/29/2016   Hoarseness of voice 11/22/2014   Coronary artery disease 05/23/2014   Hyperlipidemia 05/01/2014   Family history of early CAD 05/01/2014   Essential hypertension 05/01/2014   Smoking history 05/01/2014   Esophageal dysmotility 05/01/2014   Gastroesophageal reflux disease without esophagitis 05/01/2014    Past Surgical History:  Procedure Laterality Date   [place]   CATARACT EXTRACTION     CORONARY ANGIOPLASTY  03/30/2014   CYSTOCELE REPAIR     ESOPHAGOGASTRODUODENOSCOPY (EGD) WITH PROPOFOL N/A 03/12/2016   Procedure: ESOPHAGOGASTRODUODENOSCOPY (EGD) WITH PROPOFOL;  Surgeon: Jonathon Bellows, MD;  Location: [facility] ENDOSCOPY;  Service: Endoscopy;  Laterality: N/A;   EYE SURGERY     cataract surgery   rectal vaginal fistula repair     TONSILLECTOMY     TOTAL ABDOMINAL HYSTERECTOMY     VAGINAL HYSTERECTOMY      Prior to Admission medications   Medication Sig Start Date End Date Taking? Authorizing Provider  aspirin EC 81 MG tablet Take 81 mg by mouth daily.   Yes [provider]  carvedilol (COREG) 25 MG tablet TAKE 1 TABLET (25 MG TOTAL) BY MOUTH 2 (TWO) TIMES DAILY WITH A MEAL. 07/24/20  Yes Vigg, Avanti, MD  cetirizine (ZYRTEC) 10 MG tablet Take 10 mg by mouth daily as needed.    Yes [provider]  COD LIVER OIL PO Take by mouth daily.    Yes [provider]  Coenzyme Q10 (COQ10) 200 MG CAPS Take 1 capsule by mouth daily.    Yes [provider]  COLLAGEN PO Take by mouth.   Yes [provider]  fenofibrate 160 MG tablet Take 160 mg by mouth daily. 12/20/20  Yes [provider]  losartan (COZAAR) 25 MG tablet Take 1 tablet (25 mg total) by mouth daily. 01/09/21 04/09/21 Yes Wellington Hampshire, MD  meclizine (ANTIVERT) 25 MG tablet Take 1 tablet (25 mg total) by mouth 3 (three) times daily as needed for  dizziness. 01/31/21  Yes Vladimir Crofts, MD  pantoprazole (PROTONIX) 40 MG tablet TAKE 1 TABLET BY MOUTH EVERY DAY 08/19/20  Yes Cannady, Jolene T, NP  TURMERIC CURCUMIN PO Take by mouth daily.   Yes [provider]  VITAMIN A PO Take 300 mg by mouth daily.   Yes [provider]  vitamin B-12 (CYANOCOBALAMIN) 500 MCG tablet Take 500 mcg by mouth daily.   Yes [provider]  VITAMIN D, ERGOCALCIFEROL, PO Take by mouth.   Yes [provider]  NON FORMULARY daily. CBD oil and capsules    [provider]    Allergies Scopolamine, Amlodipine, Amlodipine, Benazepril, Codeine, Crestor [rosuvastatin], Lovastatin, Scopolamine, Statins, and Zocor [simvastatin]  Family History  Problem Relation Age of Onset   Hypertension Mother    Heart failure Mother    Hyperlipidemia Mother    Stroke Mother    Heart disease Father    Cancer Sister    Heart disease Sister    Hypertension Sister    Diabetes Brother    Heart disease Brother        pacer/defiib   Hypertension Brother    Heart attack Brother    Heart disease Brother        CABG   Leukemia Daughter     Social History Social History   Tobacco Use   Smoking status: Former    Packs/day: 3.00    Years: 23.00    Pack years: 69.00    Types: Cigarettes    Quit date: 04/15/1978    Years since quitting: 42.8   Smokeless tobacco: Never  Vaping Use   Vaping Use: Never used  Substance Use Topics   Alcohol use: No    Alcohol/week: 0.0 standard drinks   Drug use: No    Review of Systems  Constitutional: No fever/chills Eyes: No visual changes. ENT: No sore throat. Cardiovascular: Positive for chest pain. Respiratory: Denies shortness of breath. Gastrointestinal: No abdominal pain.  No nausea, no vomiting.  No diarrhea.  No constipation. Genitourinary: Negative for dysuria. Musculoskeletal: Negative for back pain. Skin: Negative for rash. Neurological: Negative for headaches, focal weakness  or numbness. Positive for vertiginous dizziness. ____________________________________________   PHYSICAL EXAM:  VITAL SIGNS: Vitals:   01/31/21 1324 01/31/21 1330  BP: (!) 199/58 (!) 192/60  Pulse: (!) 58 60  Resp: 18   Temp:    SpO2: 99% 97%    Constitutional: Alert and oriented. Well appearing and in no acute distress.  Able to sit up on the side of the bed and stand with minimal assistance.  Reports feeling okay with standing, but then reports dizziness and vertigo when she extends her neck and lifts her head up. Eyes: Conjunctivae are normal. PERRL. EOMI. Few beats of leftward horizontal nystagmus with EOM Head: Atraumatic. Nose: No congestion/rhinnorhea. Mouth/Throat: Mucous membranes are moist.  Oropharynx non-erythematous. Neck: No stridor. No cervical spine tenderness to palpation. Cardiovascular: Normal rate, regular rhythm. Grossly  normal heart sounds.  Good peripheral circulation. Respiratory: Normal respiratory effort.  No retractions. Lungs CTAB. Gastrointestinal: Soft , nondistended, nontender to palpation. No CVA tenderness. Musculoskeletal: No lower extremity tenderness nor edema.  No joint effusions. No signs of acute trauma. Neurologic:  Normal speech and language. No gross focal neurologic deficits are appreciated. No gait instability noted. Cranial nerves II through XII intact 5/5 strength and sensation in all 4 extremities Skin:  Skin is warm, dry and intact. No rash noted. Psychiatric: Mood and affect are normal. Speech and behavior are normal. ____________________________________________   LABS (all labs ordered are listed, but only abnormal results are displayed)  Labs Reviewed  BASIC METABOLIC PANEL - Abnormal; Notable for the following components:      Result Value   Glucose, Bld 143 (*)    All other components within normal limits  CBC  LIPASE, BLOOD  TROPONIN I (HIGH SENSITIVITY)  TROPONIN I (HIGH SENSITIVITY)    ____________________________________________  12 Lead EKG  Sinus rhythm, rate of 69 bpm.  Normal axis and intervals.  No evidence of acute ischemia. ____________________________________________  RADIOLOGY  ED MD interpretation:  CXR reviewed by me without evidence of acute cardiopulmonary pathology.   Official radiology report(s): DG Chest 2 View  Result Date: 01/31/2021 CLINICAL DATA:  87 year old female with chest pain. EXAM: CHEST - 2 VIEW COMPARISON:  12/29/2020 FINDINGS: The mediastinal contours are within normal limits. No cardiomegaly. Atherosclerotic calcification of the aortic arch. The lungs are clear bilaterally without evidence of focal consolidation, pleural effusion, or pneumothorax. No acute osseous abnormality. IMPRESSION: No acute cardiopulmonary process. Electronically Signed   By: Ruthann Cancer M.D.   On: 01/31/2021 10:19   CT HEAD WO CONTRAST (5MM)  Result Date: 01/31/2021 CLINICAL DATA:  Altered mental status, headache, dizziness EXAM: CT HEAD WITHOUT CONTRAST TECHNIQUE: Contiguous axial images were obtained from the base of the skull through the vertex without intravenous contrast. COMPARISON:  07/22/2017 FINDINGS: Brain: No evidence of acute infarction, hemorrhage, hydrocephalus, extra-axial collection or mass lesion/mass effect. Mild periventricular white matter hypodensity. Vascular: No hyperdense vessel or unexpected calcification. Skull: Normal. Negative for fracture or focal lesion. Sinuses/Orbits: No acute finding. Other: None. IMPRESSION: No acute intracranial pathology. Small-vessel white matter disease. Electronically Signed   By: Delanna Ahmadi M.D.   On: 01/31/2021 14:13    ____________________________________________   PROCEDURES and INTERVENTIONS  Procedure(s) performed (including Critical Care):  .1-3 Lead EKG Interpretation Performed by: Vladimir Crofts, MD Authorized by: Vladimir Crofts, MD     Interpretation: normal     ECG rate:  62   ECG rate  assessment: normal     Rhythm: sinus rhythm     Ectopy: none     Conduction: normal    Medications  meclizine (ANTIVERT) tablet 25 mg (25 mg Oral Given 01/31/21 1315)  butalbital-acetaminophen-caffeine (FIORICET) 50-325-40 MG per tablet 2 tablet (2 tablets Oral Given 01/31/21 1315)    ____________________________________________   MDM / ED COURSE   87 year old woman presents to the ED with 2 days of vertiginous dizziness and atypical chest pains, ultimately amenable to outpatient management.  She looks clinically well to me without evidence of focal neurologic deficits, vascular deficits, trauma or distress.  Horizontal nystagmus is noted, and with her history of positional vertigo, peripheral pathology is more likely than CNS.  CT head obtained without evidence of mass, ICH or further acute CNS pathology.  Symptoms resolved with meclizine and Fioricet.  Reassuring work-up without evidence of ACS or PTX.  Will discharge  with close return precautions, per her request.  Clinical Course as of 01/31/21 1446  Fri Jan 31, 2021  1432 Reassessed. Reports feeling better.  We discussed benign work-up.  I again offered an MRI of her brain, but she declined stating that she feels okay knowing that she does not have a tumor in her brain.  We also discussed observation admission considering her chest pain and blood pressure, despite negative troponins and ED work-up.  She again declines this saying that she really does not want a stay in the hospital.  We discussed meclizine at home.  We discussed return precautions. [DS]    Clinical Course User Index [DS] Vladimir Crofts, MD   ____________________________________________   FINAL CLINICAL IMPRESSION(S) / ED DIAGNOSES  Final diagnoses:  Dizziness  Vertigo  Other chest pain     ED Discharge Orders          Ordered    meclizine (ANTIVERT) 25 MG tablet  3 times daily PRN        01/31/21 1433             Mekenna Finau   Note:  This document  was prepared using Systems analyst and may include unintentional dictation errors.    Vladimir Crofts, MD 01/31/21 1449    Vladimir Crofts, MD 01/31/21 [phone]

## 2021-01-31 NOTE — ED Notes (Signed)
First encounter with pt to D/C.  D/C, RX and reasons to return discussed with pt and son, both verbalized understanding. Pt hypertensive on D/C but states she has not taken her daily BP meds today, pt educated to take them as RX'ed and not to miss a dose, pt verbalized understanding. NAD noted on D/C.

## 2021-01-31 NOTE — Telephone Encounter (Signed)
Was able to return call to Deborah Gibson regarding Losartan was causing her dizziness.  Pt reports extreme dizziness for two days, CP radiating to her back, and n/v. Pt reports when she turns her head to the left it intensify her dizziness leading to vomiting as well. Hx of vertigo, has not taken Meclizine in 3 years, no current script. Pt also not able to change position or stand without dizziness.   BP this am was 190/67 HR 64, tried dose of carvedilol 25 mg, but vomited soon as taking pill.   During conversation, pt began vomiting, son with pt to console her, emotional support given over the phone. Son took over conversation, would like to call 911 based on Deborah Gibson current status and afraid driving in a car will intensify her symptoms, agree with plan.  Advised when 911 is called to alert them of CP radiating to back Extreme dizziness SBP >190 N/V  EMS will be able to run EKG, administer Zofran through IV for n/v, administer fluids, and start any necessary interventions in route to ED for her current symptoms. Pt's son verbalized understanding, will call 911 at this time thankful for advice and returning Deborah Gibson's call.   Will r/t primary RN as Juluis Rainier

## 2021-01-31 NOTE — ED Triage Notes (Signed)
Pt comes into the ED via POV c/o chest pain that radiates to the left side and to her back.  Pt states the pain started yesterday.  Pt has also been dealing with vertigo so she has nausea.  Pt states she has a dizziness and this dizziness is different from when she diagnosed with the vertigo.  Pt has even and unlabored respirations at this time.  Pt does states she has a cardiac history with some known blockages.

## 2021-02-07 ENCOUNTER — Other Ambulatory Visit: Payer: Self-pay | Admitting: Cardiovascular Disease

## 2021-02-07 DIAGNOSIS — I739 Peripheral vascular disease, unspecified: Secondary | ICD-10-CM

## 2021-02-10 ENCOUNTER — Other Ambulatory Visit: Payer: Self-pay | Admitting: Nurse Practitioner

## 2021-02-10 NOTE — Telephone Encounter (Signed)
Requested Prescriptions  Pending Prescriptions Disp Refills  . pantoprazole (PROTONIX) 40 MG tablet [Pharmacy Med Name: PANTOPRAZOLE SOD DR 40 MG TAB] 90 tablet 1    Sig: TAKE 1 TABLET BY MOUTH EVERY DAY     Gastroenterology: Proton Pump Inhibitors Passed - 02/10/2021  1:32 AM      Passed - Valid encounter within last 12 months    Recent Outpatient Visits          1 month ago Need for influenza vaccination   [facility] Vigg, Avanti, MD   3 months ago Hypertriglyceridemia, essential   [facility] Vigg, Avanti, MD   5 months ago Pure hypertriglyceridemia   [facility] Vigg, Avanti, MD   6 months ago Hyperlipidemia, unspecified hyperlipidemia type   [facility] Vigg, Avanti, MD   7 months ago Numbness and tingling   [place], MD      Future Appointments            In 1 month Vigg, Avanti, MD [facility], [place]   In 3 months  [facility], [place]   In 4 months Arida, Mertie Clause, MD [facility], [place]

## 2021-02-12 ENCOUNTER — Other Ambulatory Visit: Payer: Self-pay | Admitting: Cardiovascular Disease

## 2021-02-12 DIAGNOSIS — I6523 Occlusion and stenosis of bilateral carotid arteries: Secondary | ICD-10-CM
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
## 2021-02-25 ENCOUNTER — Encounter: Payer: Self-pay | Admitting: Cardiovascular Disease

## 2021-02-25 ENCOUNTER — Ambulatory Visit: Payer: Medicare HMO | Admitting: Cardiovascular Disease

## 2021-02-25 ENCOUNTER — Other Ambulatory Visit: Payer: Self-pay

## 2021-02-25 VITALS — BP 170/70 | HR 61 | Ht 65.5 in | Wt 168.1 lb

## 2021-02-25 DIAGNOSIS — I739 Peripheral vascular disease, unspecified: Secondary | ICD-10-CM

## 2021-02-25 DIAGNOSIS — R55 Syncope and collapse: Secondary | ICD-10-CM | POA: Diagnosis not present

## 2021-02-25 DIAGNOSIS — I2510 Atherosclerotic heart disease of native coronary artery without angina pectoris: Secondary | ICD-10-CM

## 2021-02-25 DIAGNOSIS — I779 Disorder of arteries and arterioles, unspecified: Secondary | ICD-10-CM

## 2021-02-25 DIAGNOSIS — E781 Pure hyperglyceridemia: Secondary | ICD-10-CM

## 2021-02-25 DIAGNOSIS — I6503 Occlusion and stenosis of bilateral vertebral arteries: Secondary | ICD-10-CM

## 2021-02-25 DIAGNOSIS — I10 Essential (primary) hypertension: Secondary | ICD-10-CM

## 2021-02-25 MED ORDER — HYDRALAZINE HCL 50 MG PO TABS: ORAL_TABLET | ORAL | 3 refills | Status: DC

## 2021-02-25 NOTE — Patient Instructions (Addendum)
Please call insurance See if they will cover either repatha or praluent   Medication Instructions:  Please try hydralazine 25 up to 50 mg  as needed three times a day as needed For blood pressures >160  If you need a refill on your cardiac medications before your next appointment, please call your pharmacy.    Lab work: No new labs needed  Testing/Procedures: No new testing needed  Follow-Up: At [facility], you and your health needs are our priority.  As part of our continuing mission to provide you with exceptional heart care, we have created designated Provider Care Teams.  These Care Teams include your primary Cardiologist (physician) and Advanced Practice Providers (APPs -  Physician Assistants and Nurse Practitioners) who all work together to provide you with the care you need, when you need it.  You will need a follow up appointment in 6 months  Providers on your designated Care Team:   Murray Hodgkins, NP Christell Faith, PA-C Cadence Kathlen Mody, [place]  COVID-19 Vaccine Information can be found at: [email or web address] For questions related to vaccine distribution or appointments, please email [email or web address] or call [phone].

## 2021-03-11 ENCOUNTER — Other Ambulatory Visit: Payer: Medicare HMO

## 2021-03-18 ENCOUNTER — Other Ambulatory Visit: Payer: Medicare HMO

## 2021-03-18 ENCOUNTER — Ambulatory Visit: Payer: Medicare HMO | Admitting: Internal Medicine

## 2021-03-18 ENCOUNTER — Other Ambulatory Visit: Payer: Self-pay

## 2021-03-18 DIAGNOSIS — I10 Essential (primary) hypertension: Secondary | ICD-10-CM

## 2021-03-18 DIAGNOSIS — R7303 Prediabetes: Secondary | ICD-10-CM | POA: Diagnosis not present

## 2021-03-18 DIAGNOSIS — Z1329 Encounter for screening for other suspected endocrine disorder: Secondary | ICD-10-CM

## 2021-03-18 DIAGNOSIS — E785 Hyperlipidemia, unspecified: Secondary | ICD-10-CM | POA: Diagnosis not present

## 2021-03-18 DIAGNOSIS — E781 Pure hyperglyceridemia: Secondary | ICD-10-CM

## 2021-03-19 LAB — COMPREHENSIVE METABOLIC PANEL
ALT: 15 IU/L (ref 0–32)
AST: 25 IU/L (ref 0–40)
Albumin/Globulin Ratio: 1.8 (ref 1.2–2.2)
Albumin: 4.4 g/dL (ref 3.6–4.6)
Alkaline Phosphatase: 101 IU/L (ref 44–121)
BUN/Creatinine Ratio: 18 (ref 12–28)
BUN: 16 mg/dL (ref 8–27)
Bilirubin Total: 0.5 mg/dL (ref 0.0–1.2)
CO2: 20 mmol/L (ref 20–29)
Calcium: 9.3 mg/dL (ref 8.7–10.3)
Chloride: 99 mmol/L (ref 96–106)
Creatinine, Ser: 0.91 mg/dL (ref 0.57–1.00)
Globulin, Total: 2.4 g/dL (ref 1.5–4.5)
Glucose: 125 mg/dL — ABNORMAL HIGH (ref 70–99)
Potassium: 3.9 mmol/L (ref 3.5–5.2)
Sodium: 138 mmol/L (ref 134–144)
Total Protein: 6.8 g/dL (ref 6.0–8.5)
eGFR: 61 mL/min/{1.73_m2} (ref >59)

## 2021-03-19 LAB — HEMOGLOBIN A1C
Est. average glucose Bld gHb Est-mCnc: 114 mg/dL
Hgb A1c MFr Bld: 5.6 % (ref 4.8–5.6)

## 2021-03-19 LAB — CBC WITH DIFFERENTIAL/PLATELET
Basophils Absolute: 0.1 10*3/uL (ref 0.0–0.2)
Basos: 1 %
EOS (ABSOLUTE): 0.4 10*3/uL (ref 0.0–0.4)
Eos: 6 %
Hematocrit: 36.3 % (ref 34.0–46.6)
Hemoglobin: 11.8 g/dL (ref 11.1–15.9)
Immature Grans (Abs): 0 10*3/uL (ref 0.0–0.1)
Immature Granulocytes: 0 %
Lymphocytes Absolute: 1.8 10*3/uL (ref 0.7–3.1)
Lymphs: 30 %
MCH: 29.9 pg (ref 26.6–33.0)
MCHC: 32.5 g/dL (ref 31.5–35.7)
MCV: 92 fL (ref 79–97)
Monocytes Absolute: 0.7 10*3/uL (ref 0.1–0.9)
Monocytes: 11 %
Neutrophils Absolute: 3.2 10*3/uL (ref 1.4–7.0)
Neutrophils: 52 %
Platelets: 206 10*3/uL (ref 150–450)
RBC: 3.95 x10E6/uL (ref 3.77–5.28)
RDW: 12.8 % (ref 11.7–15.4)
WBC: 6.1 10*3/uL (ref 3.4–10.8)

## 2021-03-19 LAB — LIPID PANEL
Chol/HDL Ratio: 4.1 ratio (ref 0.0–4.4)
Cholesterol, Total: 200 mg/dL — ABNORMAL HIGH (ref 100–199)
HDL: 49 mg/dL (ref >39)
LDL Chol Calc (NIH): 113 mg/dL — ABNORMAL HIGH (ref 0–99)
Triglycerides: 219 mg/dL — ABNORMAL HIGH (ref 0–149)
VLDL Cholesterol Cal: 38 mg/dL (ref 5–40)

## 2021-03-19 LAB — TSH: TSH: 2.59 u[IU]/mL (ref 0.450–4.500)

## 2021-03-26 ENCOUNTER — Ambulatory Visit (INDEPENDENT_AMBULATORY_CARE_PROVIDER_SITE_OTHER): Payer: Medicare HMO | Admitting: Internal Medicine

## 2021-03-26 ENCOUNTER — Other Ambulatory Visit: Payer: Self-pay

## 2021-03-26 ENCOUNTER — Encounter: Payer: Self-pay | Admitting: Internal Medicine

## 2021-03-26 VITALS — BP 169/69 | HR 65 | Temp 97.6°F | Ht 65.35 in | Wt 167.4 lb

## 2021-03-26 DIAGNOSIS — I25118 Atherosclerotic heart disease of native coronary artery with other forms of angina pectoris: Secondary | ICD-10-CM

## 2021-03-26 DIAGNOSIS — I10 Essential (primary) hypertension: Secondary | ICD-10-CM

## 2021-03-26 DIAGNOSIS — E781 Pure hyperglyceridemia: Secondary | ICD-10-CM

## 2021-03-26 DIAGNOSIS — I739 Peripheral vascular disease, unspecified: Secondary | ICD-10-CM | POA: Diagnosis not present

## 2021-03-26 NOTE — Progress Notes (Signed)
BP (!) 169/69    Pulse 65    Temp 97.6 F (36.4 C) (Oral)    Ht 5' 5.35" (1.66 m)    Wt 167 lb 6.4 oz (75.9 kg)    SpO2 100%    BMI 27.56 kg/m    Subjective:    Patient ID: Deborah Gibson, female    DOB: June 14, 1931, 87 y.o.   MRN: [id number]  Chief Complaint  Patient presents with   Hyperlipidemia   Hypertension    HPI: Deborah Gibson is a 87 y.o. female  Hyperlipidemia This is a chronic problem. The problem is uncontrolled. Pertinent negatives include no chest pain or shortness of breath. Compliance problems include adherence to diet and medication side effects.   Hypertension This is a chronic problem. The problem is uncontrolled. Pertinent negatives include no anxiety, blurred vision, chest pain, headaches, malaise/fatigue, neck pain, orthopnea, palpitations, peripheral edema, PND or shortness of breath.   Chief Complaint  Patient presents with   Hyperlipidemia   Hypertension    Relevant past medical, surgical, family and social history reviewed and updated as indicated. Interim medical history since our last visit reviewed. Allergies and medications reviewed and updated.  Review of Systems  Constitutional:  Negative for malaise/fatigue.  Eyes:  Negative for blurred vision.  Respiratory:  Negative for shortness of breath.   Cardiovascular:  Negative for chest pain, palpitations, orthopnea and PND.  Musculoskeletal:  Negative for neck pain.  Neurological:  Negative for headaches.   Per HPI unless specifically indicated above     Objective:    BP (!) 169/69    Pulse 65    Temp 97.6 F (36.4 C) (Oral)    Ht 5' 5.35" (1.66 m)    Wt 167 lb 6.4 oz (75.9 kg)    SpO2 100%    BMI 27.56 kg/m   Wt Readings from Last 3 Encounters:  03/26/21 167 lb 6.4 oz (75.9 kg)  02/25/21 168 lb 2 oz (76.3 kg)  01/31/21 164 lb 0.4 oz (74.4 kg)    Physical Exam  Results for orders placed or performed in visit on 03/18/21  Hemoglobin A1c  Result Value Ref Range   Hgb A1c MFr  Bld 5.6 4.8 - 5.6 %   Est. average glucose Bld gHb Est-mCnc 114 mg/dL  TSH  Result Value Ref Range   TSH 2.590 0.450 - 4.500 uIU/mL  Lipid panel  Result Value Ref Range   Cholesterol, Total 200 (H) 100 - 199 mg/dL   Triglycerides 219 (H) 0 - 149 mg/dL   HDL 49 >39 mg/dL   VLDL Cholesterol Cal 38 5 - 40 mg/dL   LDL Chol Calc (NIH) 113 (H) 0 - 99 mg/dL   Chol/HDL Ratio 4.1 0.0 - 4.4 ratio  Comprehensive metabolic panel  Result Value Ref Range   Glucose 125 (H) 70 - 99 mg/dL   BUN 16 8 - 27 mg/dL   Creatinine, Ser 0.91 0.57 - 1.00 mg/dL   eGFR 61 >59 mL/min/1.73   BUN/Creatinine Ratio 18 12 - 28   Sodium 138 134 - 144 mmol/L   Potassium 3.9 3.5 - 5.2 mmol/L   Chloride 99 96 - 106 mmol/L   CO2 20 20 - 29 mmol/L   Calcium 9.3 8.7 - 10.3 mg/dL   Total Protein 6.8 6.0 - 8.5 g/dL   Albumin 4.4 3.6 - 4.6 g/dL   Globulin, Total 2.4 1.5 - 4.5 g/dL   Albumin/Globulin Ratio 1.8 1.2 - 2.2   Bilirubin  Total 0.5 0.0 - 1.2 mg/dL   Alkaline Phosphatase 101 44 - 121 IU/L   AST 25 0 - 40 IU/L   ALT 15 0 - 32 IU/L  CBC w/Diff  Result Value Ref Range   WBC 6.1 3.4 - 10.8 x10E3/uL   RBC 3.95 3.77 - 5.28 x10E6/uL   Hemoglobin 11.8 11.1 - 15.9 g/dL   Hematocrit 36.3 34.0 - 46.6 %   MCV 92 79 - 97 fL   MCH 29.9 26.6 - 33.0 pg   MCHC 32.5 31.5 - 35.7 g/dL   RDW 12.8 11.7 - 15.4 %   Platelets 206 150 - 450 x10E3/uL   Neutrophils 52 Not Estab. %   Lymphs 30 Not Estab. %   Monocytes 11 Not Estab. %   Eos 6 Not Estab. %   Basos 1 Not Estab. %   Neutrophils Absolute 3.2 1.4 - 7.0 x10E3/uL   Lymphocytes Absolute 1.8 0.7 - 3.1 x10E3/uL   Monocytes Absolute 0.7 0.1 - 0.9 x10E3/uL   EOS (ABSOLUTE) 0.4 0.0 - 0.4 x10E3/uL   Basophils Absolute 0.1 0.0 - 0.2 x10E3/uL   Immature Granulocytes 0 Not Estab. %   Immature Grans (Abs) 0.0 0.0 - 0.1 x10E3/uL        Current Outpatient Medications:    aspirin EC 81 MG tablet, Take 81 mg by mouth daily., Disp: , Rfl:    carvedilol (COREG) 25 MG tablet,  TAKE 1 TABLET (25 MG TOTAL) BY MOUTH 2 (TWO) TIMES DAILY WITH A MEAL., Disp: 180 tablet, Rfl: 4   cetirizine (ZYRTEC) 10 MG tablet, Take 10 mg by mouth daily as needed. , Disp: , Rfl:    COD LIVER OIL PO, Take by mouth daily. , Disp: , Rfl:    Coenzyme Q10 (COQ10) 200 MG CAPS, Take 1 capsule by mouth daily. , Disp: , Rfl:    COLLAGEN PO, Take by mouth., Disp: , Rfl:    hydrALAZINE (APRESOLINE) 50 MG tablet, Take 25 to 50 mg, up to three times a day as needed for blood pressures >160, Disp: 180 tablet, Rfl: 3   losartan (COZAAR) 25 MG tablet, Take 1 tablet (25 mg total) by mouth daily., Disp: 30 tablet, Rfl: 5   meclizine (ANTIVERT) 25 MG tablet, Take 1 tablet (25 mg total) by mouth 3 (three) times daily as needed for dizziness., Disp: 30 tablet, Rfl: 0   NON FORMULARY, daily. CBD oil and capsules, Disp: , Rfl:    pantoprazole (PROTONIX) 40 MG tablet, TAKE 1 TABLET BY MOUTH EVERY DAY, Disp: 90 tablet, Rfl: 1   TURMERIC CURCUMIN PO, Take by mouth daily., Disp: , Rfl:    VITAMIN A PO, Take 300 mg by mouth daily., Disp: , Rfl:    vitamin B-12 (CYANOCOBALAMIN) 500 MCG tablet, Take 500 mcg by mouth daily., Disp: , Rfl:    VITAMIN D, ERGOCALCIFEROL, PO, Take by mouth., Disp: , Rfl:    fenofibrate 160 MG tablet, Take 160 mg by mouth daily. (Patient not taking: Reported on 03/26/2021), Disp: , Rfl:     Assessment & Plan:  Prediabetes FSBS 125 but A1c - 5.6  Stable.  Lifestyle modifications advised to pt. A1c at   Portion control and avoiding high carb low fat diet advised.  Diet plan given to pt   exercise plan given and encouraged.  To increase exercise to 150 mins a week ie 21/2 hours a week. Pt verbalises understanding of the above.   2. HLD: is not on meds for such  Doesn't want to take statins / fenofibrate for such seen dr. Ermelinda Das intolerant Myalgias  Latest Reference Range & Units 12/10/20 09:04 03/18/21 09:40  Total CHOL/HDL Ratio 0.0 - 4.4 ratio 5.1 (H) 4.1   Cholesterol, Total 100 - 199 mg/dL 198 200 (H)  HDL Cholesterol >39 mg/dL 39 (L) 49  Triglycerides 0 - 149 mg/dL 254 (H) 219 (H)  VLDL Cholesterol Cal 5 - 40 mg/dL 44 (H) 38  LDL Chol Calc (NIH) 0 - 99 mg/dL 115 (H) 113 (H)  (H): Data is abnormally high (L): Data is abnormally low recheck FLP, check LFT's work on diet, SE of meds explained to pt. low fat and high fiber diet explained to pt.   PAD stable, sees vas surg for such fu and mx per them.  CAD : stable, chronic  Sees Dr. Rockey Situ for scuh  Fu and mx per cards Is on Losartan Hydralazine and coreg for such bp stable.   Carotid stenosis stable. TG and Ldl not at goal.    Problem List Items Addressed This Visit   None    No orders of the defined types were placed in this encounter.    No orders of the defined types were placed in this encounter.    Follow up plan: No follow-ups on file.

## 2021-03-26 NOTE — Patient Instructions (Signed)

## 2021-04-06 ENCOUNTER — Emergency Department: Payer: Medicare HMO

## 2021-04-06 ENCOUNTER — Encounter: Payer: Self-pay | Admitting: Emergency Medicine

## 2021-04-06 ENCOUNTER — Other Ambulatory Visit: Payer: Self-pay

## 2021-04-06 ENCOUNTER — Emergency Department
Admission: EM | Admit: 2021-04-06 | Discharge: 2021-04-06 | Disposition: A | Discharge status: Home / Self Care | Payer: Medicare HMO | Attending: Emergency Medicine | Admitting: Emergency Medicine

## 2021-04-06 DIAGNOSIS — I959 Hypotension, unspecified: Secondary | ICD-10-CM | POA: Diagnosis not present

## 2021-04-06 DIAGNOSIS — E871 Hypo-osmolality and hyponatremia: Secondary | ICD-10-CM | POA: Diagnosis not present

## 2021-04-06 DIAGNOSIS — R42 Dizziness and giddiness: Secondary | ICD-10-CM | POA: Diagnosis not present

## 2021-04-06 DIAGNOSIS — I4892 Unspecified atrial flutter: Secondary | ICD-10-CM | POA: Diagnosis not present

## 2021-04-06 DIAGNOSIS — R110 Nausea: Secondary | ICD-10-CM | POA: Diagnosis not present

## 2021-04-06 DIAGNOSIS — R978 Other abnormal tumor markers: Secondary | ICD-10-CM | POA: Diagnosis not present

## 2021-04-06 DIAGNOSIS — Z743 Need for continuous supervision: Secondary | ICD-10-CM | POA: Diagnosis not present

## 2021-04-06 DIAGNOSIS — R55 Syncope and collapse: Secondary | ICD-10-CM | POA: Diagnosis not present

## 2021-04-06 LAB — CBC
HCT: 36.3 % (ref 36.0–46.0)
Hemoglobin: 11.8 g/dL — ABNORMAL LOW (ref 12.0–15.0)
MCH: 29 pg (ref 26.0–34.0)
MCHC: 32.5 g/dL (ref 30.0–36.0)
MCV: 89.2 fL (ref 80.0–100.0)
Platelets: 244 10*3/uL (ref 150–400)
RBC: 4.07 MIL/uL (ref 3.87–5.11)
RDW: 12.4 % (ref 11.5–15.5)
WBC: 7.6 10*3/uL (ref 4.0–10.5)
nRBC: 0 % (ref 0.0–0.2)

## 2021-04-06 LAB — BASIC METABOLIC PANEL
Anion gap: 8 (ref 5–15)
BUN: 22 mg/dL (ref 8–23)
CO2: 20 mmol/L — ABNORMAL LOW (ref 22–32)
Calcium: 9 mg/dL (ref 8.9–10.3)
Chloride: 104 mmol/L (ref 98–111)
Creatinine, Ser: 0.78 mg/dL (ref 0.44–1.00)
GFR, Estimated: >60 mL/min (ref >60)
Glucose, Bld: 136 mg/dL — ABNORMAL HIGH (ref 70–99)
Potassium: 4 mmol/L (ref 3.5–5.1)
Sodium: 132 mmol/L — ABNORMAL LOW (ref 135–145)

## 2021-04-06 NOTE — ED Provider Triage Note (Signed)
Emergency Medicine Provider Triage Evaluation Note  Deborah Gibson , a 88 y.o. female  was evaluated in triage.  Pt complains of nausea, dizziness, and syncope. Reports she passed out at church. This has occurred before but workup did not reveal cause of syncopal episodes. No chest pain, shortness of breath, unilateral weakness, nausea, or vomiting  Review of Systems  Positive: Nausea, dizziness, syncopal episode. Chronic neck pain. Negative: Headache, vision changes, slurred speech, unilateral weakness, nausea, vomiting, or diarrhea  Physical Exam  There were no vitals taken for this visit. Gen:   Awake, responsive. Engages but weak Resp:  Normal effort  Cardio:  Bradycardic, normal rhtym Neuro:  Shoulder shrug equal, hand grips equal. No facial droop  Medical Decision Making  Medically screening exam initiated at 11:35 AM.  Appropriate orders placed.  KIMBRIA CAMPOSANO was informed that the remainder of the evaluation will be completed by another provider, this initial triage assessment does not replace that evaluation, and the importance of remaining in the ED until their evaluation is complete.  Syncope, Weakness:  EKG, CBC. BMET, urinalysis, ct head   Jearld Fenton, NP 04/06/21 1149

## 2021-04-06 NOTE — ED Triage Notes (Signed)
Pt via EMS from church. Pt states she had a syncopal episode at church, states that she became really dizzy and passed out. Pt does not remember much from the incident. Pt states she has a headache. Pt is A&Ox4 and NAD.

## 2021-04-06 NOTE — ED Triage Notes (Signed)
Pt in via EMS from church after having a syncopal episode at church. Pt did not fall, has hx of the same previously but was not able to determine why. Pt reports feels nauseated and dizzy

## 2021-04-07 ENCOUNTER — Telehealth: Payer: Self-pay | Admitting: Cardiovascular Disease

## 2021-04-07 NOTE — Telephone Encounter (Signed)
I spoke with the patient. She advised that she had a syncopal episode at church yesterday. 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 She could not recall how long she had been standing or if she had been sitting at all.  She advised her SBP in the ER was ~ 80 when she arrived and then it came up to 158.  She was in the ER on 01/31/21 with similar symptoms per her report. She saw Dr. Rockey Situ on 02/25/21.  Her SBP at home this morning has been 120-128. She has checked her BP 4-5 times. She called to inquire if she should take her losartan this morning.  I advised if her SBP is > 100, she should be fine to take her losartan.  I have advised she not check her BP so often as to not skew the readings. She is advised to check her BP if she does not feel well.  The patient states she typically will take her BP in the morning 1st thing, then take her losartan. She will wait and check this again to see if she needs to take her hydralazine.  I inquired if the patient could recall if she has had orthostatic BP readings done (lying, sitting, & standing) and she could not recall this.  I advised that when she comes to her already scheduled appointment on 04/09/21 with Ignacia Bayley, NP, we will check these readings on her.   The patient is aware to call back sooner if needed with any questions/ concerns. She voices understanding of the above and is agreeable.

## 2021-04-07 NOTE — Telephone Encounter (Signed)
Patient calling  Just came home from hospital and has questions in regards to medications  Please call to discuss

## 2021-04-09 ENCOUNTER — Ambulatory Visit: Payer: Medicare HMO | Admitting: Nurse Practitioner

## 2021-04-09 ENCOUNTER — Encounter: Payer: Self-pay | Admitting: Nurse Practitioner

## 2021-04-09 ENCOUNTER — Ambulatory Visit (INDEPENDENT_AMBULATORY_CARE_PROVIDER_SITE_OTHER): Payer: Medicare HMO

## 2021-04-09 ENCOUNTER — Other Ambulatory Visit: Payer: Self-pay

## 2021-04-09 VITALS — BP 151/69 | HR 60 | Ht 65.0 in | Wt 165.0 lb

## 2021-04-09 DIAGNOSIS — E785 Hyperlipidemia, unspecified: Secondary | ICD-10-CM | POA: Diagnosis not present

## 2021-04-09 DIAGNOSIS — I739 Peripheral vascular disease, unspecified: Secondary | ICD-10-CM | POA: Diagnosis not present

## 2021-04-09 DIAGNOSIS — I6523 Occlusion and stenosis of bilateral carotid arteries: Secondary | ICD-10-CM | POA: Diagnosis not present

## 2021-04-09 DIAGNOSIS — I10 Essential (primary) hypertension: Secondary | ICD-10-CM | POA: Diagnosis not present

## 2021-04-09 DIAGNOSIS — R55 Syncope and collapse: Secondary | ICD-10-CM | POA: Diagnosis not present

## 2021-04-09 MED ORDER — HYDRALAZINE HCL 50 MG PO TABS: ORAL_TABLET | ORAL | 3 refills | Status: DC

## 2021-04-09 NOTE — Progress Notes (Signed)
Office Visit    Patient Name: Deborah Gibson Date of Encounter: 04/09/2021  Primary Care Provider:  Charlynne Cousins, MD Primary Cardiologist:  Ida Rogue, MD/PV: Jerilynn Mages. Fletcher Anon, MD   Chief Complaint    88 year old female with a history of hypertension, hyperlipidemia, GERD, nonobstructive CAD, peripheral arterial disease, claudication, syncope, and prior tobacco abuse, who presents for follow-up after recent ER evaluation for syncope that occurred at church.  Past Medical History    Past Medical History:  Diagnosis Date   Arthritis    CAD (coronary artery disease)    a. 04/2014 Cath: EF >55%, diffuse minor irregularities.   Carotid arterial disease (HCC)    a. Carotid U/S: RICA 21-30%, LICA 86-57%.   Diastolic dysfunction    a. 06/2017 Echo: EF 60-65%, mod LVH, Gr2DD, mild AI/MR. Mildly dil LA. Nl RV fxn. PASP 30-76mmHg.   GERD (gastroesophageal reflux disease)    Hyperlipidemia    Hypertension    Orthostatic hypotension    a. 12/2020 w/ syncop-->HCTZ discontinued.   PAD (peripheral artery disease) ([facility])    a. 12/2020 ABIs/Duplex: R 0.77 RCFA 30-49, RSFA 50-74, L 0.66, L Iliac 50-74, LCFA 30-49, LSFA 50-74.   Seasonal allergies    Skin cancer    Syncope    a. 12/2020 2/2 orthostasis.   Past Surgical History:  Procedure Laterality Date   BLADDER SUSPENSION     CARDIAC CATHETERIZATION     [place], [place]   CATARACT EXTRACTION     CORONARY ANGIOPLASTY  03/30/2014   CYSTOCELE REPAIR     ESOPHAGOGASTRODUODENOSCOPY (EGD) WITH PROPOFOL N/A 03/12/2016   Procedure: ESOPHAGOGASTRODUODENOSCOPY (EGD) WITH PROPOFOL;  Surgeon: Jonathon Bellows, MD;  Location: [facility] ENDOSCOPY;  Service: Endoscopy;  Laterality: N/A;   EYE SURGERY     cataract surgery   rectal vaginal fistula repair     TONSILLECTOMY     TOTAL ABDOMINAL HYSTERECTOMY     VAGINAL HYSTERECTOMY      Allergies  Allergies  Allergen Reactions   Scopolamine Anaphylaxis   Amlodipine    Amlodipine Swelling    Benazepril Cough   Codeine     Sick on stomach    Crestor [Rosuvastatin] Other (See Comments)    myalgias   Lovastatin Other (See Comments)    Myalgias    Scopolamine    Statins     Legs ache    Zocor [Simvastatin] Other (See Comments)    myalgias    History of Present Illness    88 year old female with the above past medical history including hypertension, hyperlipidemia, nonobstructive CAD, peripheral arterial disease, claudication, syncope, GERD, and prior tobacco abuse.  She previously underwent diagnostic catheterization in the setting of unstable angina in fib[place] 2016, which showed diffuse minor irregularities and normal LV function.  Echocardiogram in April 2019 showed an EF of 60 to 65% with grade 2 diastolic dysfunction.  Mild AI and MR were also noted.  Also unable to thousand 18, she underwent CTA of the chest, abdomen, and pelvis in the setting of chest pain.  This incidentally showed aortic atherosclerosis.  In mid 2022, she complained of lower extremity claudication and underwent ABIs, which were abnormal at 0.77 on the right 0.66 on the left.  Lower extremity arterial duplex showed moderate bilateral lower extremity disease.  Carotid ultrasound showed 40 to 59% right internal carotid artery stenosis with a 60 to 79% left internal carotid artery stenosis.  She was seen by Dr. Fletcher Anon with recommendation for a walking program.  At follow-up  office visit on October 13, she noted stable claudication symptoms.  She had suffered a syncopal spell in early October in the setting of orthostasis and HCTZ was discontinued.  She had another ER visit in early November in the setting of dizziness and what was felt to be vertiginous symptoms.  This past Sunday, she was at church and standing up with the choir.  They had 2 songs in a row.  She was sort of leaning up against a stool when she had sudden onset of warmth and diaphoresis associated with lightheadedness and narrowing of the visual field.   She felt as though she was going to lose consciousness and in fact, she initially fell forward and then back onto the floor.  She is not sure how long she was without consciousness but other choir members were around her immediately.  Upon regaining consciousness, she did feel somewhat nauseated and had the urge to have a bowel movement.  EMS was called and she says that her blood pressure was in the 80s.  She was taken to the [facility] emergency department where labs were relatively unremarkable and CT of the head showed no acute intracranial pathology.  She was discharged home and advised to follow-up with cardiology.  She has had no recurrence of symptoms.  She notes that Sunday morning was no different than any other morning.  She typically eats 2 pieces of toast and has coffee, which she did that morning.  In discussing how she takes her blood pressure medications, she usually takes her morning dose of carvedilol and losartan and then 15 minutes later, if her systolic is greater than 732, she will take 25 mg of hydralazine.  She did take 25 mg of hydralazine that morning and notes that she will commonly take hydralazine 25 mg twice daily (it is ordered as needed up to 3 times per day).  She is relatively inactive and has chronic bilateral lower extremity heaviness and claudication, which she notes has been stable.  She denies chest pain, dyspnea, palpitations, PND, orthopnea, edema, or early satiety.  Home Medications    Current Outpatient Medications  Medication Sig Dispense Refill   aspirin EC 81 MG tablet Take 81 mg by mouth daily.     carvedilol (COREG) 25 MG tablet TAKE 1 TABLET (25 MG TOTAL) BY MOUTH 2 (TWO) TIMES DAILY WITH A MEAL. 180 tablet 4   cetirizine (ZYRTEC) 10 MG tablet Take 10 mg by mouth daily as needed.      COD LIVER OIL PO Take by mouth daily.      Coenzyme Q10 (COQ10) 200 MG CAPS Take 1 capsule by mouth daily.      COLLAGEN PO Take by mouth.     losartan (COZAAR) 25 MG tablet Take  1 tablet (25 mg total) by mouth daily. 30 tablet 5   meclizine (ANTIVERT) 25 MG tablet Take 1 tablet (25 mg total) by mouth 3 (three) times daily as needed for dizziness. 30 tablet 0   meloxicam (MOBIC) 7.5 MG tablet Take 7.5 mg by mouth as needed for pain.     NON FORMULARY daily. CBD oil and capsules     pantoprazole (PROTONIX) 40 MG tablet TAKE 1 TABLET BY MOUTH EVERY DAY 90 tablet 1   TURMERIC CURCUMIN PO Take by mouth daily.     VITAMIN A PO Take 300 mg by mouth daily.     vitamin B-12 (CYANOCOBALAMIN) 500 MCG tablet Take 500 mcg by mouth daily.  VITAMIN D, ERGOCALCIFEROL, PO Take by mouth.     hydrALAZINE (APRESOLINE) 50 MG tablet Take 25 to 50 mg, up to three times a day as needed for blood pressures >180 180 tablet 3   No current facility-administered medications for this visit.     Review of Systems    Syncope with ongoing claudication (stable) as outlined above.  She denies chest pain, dyspnea, palpitations, PND, orthopnea, edema, or early satiety.  All other systems reviewed and are otherwise negative except as noted above.    Physical Exam    VS:  BP (!) 151/69    Pulse 60    Ht 5\' 5"  (1.651 m)    Wt 165 lb (74.8 kg)    SpO2 98%    BMI 27.46 kg/m  , BMI Body mass index is 27.46 kg/m.     Orthostatic VS for the past 24 hrs:  BP- Lying Pulse- Lying BP- Sitting Pulse- Sitting BP- Standing at 0 minutes Pulse- Standing at 0 minutes  04/09/21 1313 153/77 62 163/75 61 154/75 62     GEN: Well nourished, well developed, in no acute distress. HEENT: normal. Neck: Supple, no JVD or masses.  Soft right and more prominent left carotid bruits. Cardiac: RRR, no murmurs, rubs, or gallops. No clubbing, cyanosis, edema.  Radials/PT 1+ and equal bilaterally.  Respiratory:  Respirations regular and unlabored, clear to auscultation bilaterally. GI: Soft, nontender, nondistended, BS + x 4. MS: no deformity or atrophy. Skin: warm and dry, no rash. Neuro:  Strength and sensation are  intact. Psych: Normal affect.  Accessory Clinical Findings    ECG personally reviewed by me today -regular sinus rhythm, 60, baseline artifact- no acute changes.  Lab Results  Component Value Date   WBC 7.6 04/06/2021   HGB 11.8 (L) 04/06/2021   HCT 36.3 04/06/2021   MCV 89.2 04/06/2021   PLT 244 04/06/2021   Lab Results  Component Value Date   CREATININE 0.78 04/06/2021   BUN 22 04/06/2021   NA 132 (L) 04/06/2021   K 4.0 04/06/2021   CL 104 04/06/2021   CO2 20 (L) 04/06/2021   Lab Results  Component Value Date   ALT 15 03/18/2021   AST 25 03/18/2021   ALKPHOS 101 03/18/2021   BILITOT 0.5 03/18/2021   Lab Results  Component Value Date   CHOL 200 (H) 03/18/2021   HDL 49 03/18/2021   LDLCALC 113 (H) 03/18/2021   TRIG 219 (H) 03/18/2021   CHOLHDL 4.1 03/18/2021    Lab Results  Component Value Date   HGBA1C 5.6 03/18/2021    Assessment & Plan    1.  Syncope: Since October, patient has had 3 episodes of presyncope or syncope, though episode in November 2022 is described as being more vertiginous in nature.  She was previously noted to be orthostatic in October with discontinuation of diuretic therapy at that time.  This past Sunday, she was standing in church for prolonged period of time and had sudden onset of warmth/flushing and diaphoresis followed by presyncope and subsequently syncope.  Upon regaining consciousness, which she believes was fairly quickly, she was mildly nauseated and had the urge to have a bowel movement.  Per her report, EMS found her to be hypotensive with a systolic blood pressure of 80.  Lab work in the ED was relatively unremarkable and CT of the head showed no acute findings.  She felt that she hydrated normally that morning and her BUN and creatinine were normal as  well.  I suspect her symptoms were vasovagal in nature however, we discussed her use of as needed hydralazine and it appears that she is taking 25 mg for systolics greater than 003 mmHg  but only waits about 15 minutes after taking her usual morning medications to determine whether or not her systolic is elevated.  For starters, I recommended that she wait 2 hours after taking morning medications before reevaluating her systolic blood pressure.  Further, given recurrent presyncope/syncope, she should hold off on using hydralazine unless systolics are greater than 180 mmHg and if this is occurring frequently, she would be better served by titration of losartan then ongoing use of as needed hydralazine.  Finally, as the symptoms have been recurring, I will place a 14-day Zio monitor and arrange for 2D echocardiogram.  2.  Essential hypertension: Blood pressure elevated today and I suspect this is a common trend for her with systolics in the 704U.  As above, I do not think she is providing enough time for her morning medications to take effect prior to using as needed hydralazine, which may be contributing to symptoms of presyncope as well as rebound hypertension later in the day.  I have asked her to wait 2 hours after taking morning medications before repeating her blood pressure and considering as needed hydralazine.  Further, I have raised the threshold for taking hydralazine to a systolic greater than 889.  Finally, if blood pressure trends remain elevated, she will likely benefit from further titration of losartan for more balanced therapy throughout the day as opposed to using as needed hydralazine.  3.  Peripheral arterial disease/claudication: Stable bilateral calf claudication, though she notes that activity is fairly limited.  She remains on aspirin therapy.  She did not tolerate statins.  She is interested in [place] and now has insurance that will cover it.  We will start the process of getting her Repatha.  4.  Hyperlipidemia: LDL of 113 in December 2022.  Statin intolerant.  In light of nonobstructive CAD and PAD/carotid disease, we will try again to get her Repatha.  5.   Nonobstructive CAD: Denies chest pain or dyspnea.  Continue aspirin and beta-blocker therapy.  Prescribing Repatha.  6.  Carotid arterial disease: Moderate bilateral disease noted in mid 2022 with plan for follow-up in about 6 months.  7.  Disposition: Follow-up echocardiogram and Zio monitoring.  Patient continue to track blood pressures at home and will notify us of persistent elevations.   Murray Hodgkins, NP 04/09/2021, 1:19 PM

## 2021-04-09 NOTE — Patient Instructions (Addendum)
Medication Instructions:  - Your physician has recommended you make the following change in your medication:   1) Hydralazine 50 mg: - take 0.5-1 tablet (25-50 mg) by mouth three times a day as needed for blood pressures > 180  - please wait at least 2 hours after taking your morning blood pressure medications, then check your blood pressure to see if you need to use the hydralazine  *If you need a refill on your cardiac medications before your next appointment, please call your pharmacy*   Lab Work: - none ordered  If you have labs (blood work) drawn today and your tests are completely normal, you will receive your results only by: MyChart Message (if you have MyChart) OR A paper copy in the mail If you have any lab test that is abnormal or we need to change your treatment, we will call you to review the results.   Testing/Procedures:  1) Echocardiogram: - Your physician has requested that you have an echocardiogram (in about 3 weeks- to allow time to wear the heart monitor). Echocardiography is a painless test that uses sound waves to create images of your heart. It provides your doctor with information about the size and shape of your heart and how well your hearts chambers and valves are working. This procedure takes approximately one hour. There are no restrictions for this procedure. There is a possibility that an IV may need to be started during your test to inject an image enhancing agent. This is done to obtain more optimal pictures of your heart. Therefore we ask that you do at least drink some water prior to coming in to hydrate your veins.    2) Heart Monitor:  Length of Wear: 14 days Placement: this will be mailed to your home address, typically within 3-4 business days  Your physician has recommended that you wear a Zio monitor.   This monitor is a medical device that records the hearts electrical activity. Doctors most often use these monitors to diagnose  arrhythmias. Arrhythmias are problems with the speed or rhythm of the heartbeat. The monitor is a small device applied to your chest. You can wear one while you do your normal daily activities. While wearing this monitor if you have any symptoms to push the button and record what you felt. Once you have worn this monitor for the period of time provider prescribed (Usually 14 days), you will return the monitor device in the postage paid box. Once it is returned they will download the data collected and provide [place] with a report which the provider will then review and we will call you with those results. Important tips:  Avoid showering during the first 24 hours of wearing the monitor. Avoid excessive sweating to help maximize wear time. Do not submerge the device, no hot tubs, and no swimming pools. Keep any lotions or oils away from the patch. After 24 hours you may shower with the patch on. Take brief showers with your back facing the shower head.  Do not remove patch once it has been placed because that will interrupt data and decrease adhesive wear time. Push the button when you have any symptoms and write down what you were feeling. Once you have completed wearing your monitor, remove and place into box which has postage paid and place in your outgoing mailbox.  If for some reason you have misplaced your box then call our office and we can provide another box and/or mail it off for you.  Follow-Up: At [facility], you and your health needs are our priority.  As part of our continuing mission to provide you with exceptional heart care, we have created designated Provider Care Teams.  These Care Teams include your primary Cardiologist (physician) and Advanced Practice Providers (APPs -  Physician Assistants and Nurse Practitioners) who all work together to provide you with the care you need, when you need it.  We recommend signing up for the patient portal called "MyChart".  Sign up  information is provided on this After Visit Summary.  MyChart is used to connect with patients for Virtual Visits (Telemedicine).  Patients are able to view lab/test results, encounter notes, upcoming appointments, etc.  Non-urgent messages can be sent to your provider as well.   To learn more about what you can do with MyChart, go to [email or web address].    Your next appointment:   6 week(s)  The format for your next appointment:   In Person  Provider:   You may see Ida Rogue, MD or one of the following Advanced Practice Providers on your designated Care Team:   Murray Hodgkins, NP    Other Instructions  Echocardiogram An echocardiogram is a test that uses sound waves (ultrasound) to produce images of the heart. Images from an echocardiogram can provide important information about: Heart size and shape. The size and thickness and movement of your heart's walls. Heart muscle function and strength. Heart valve function or if you have stenosis. Stenosis is when the heart valves are too narrow. If blood is flowing backward through the heart valves (regurgitation). A tumor or infectious growth around the heart valves. Areas of heart muscle that are not working well because of poor blood flow or injury from a heart attack. Aneurysm detection. An aneurysm is a weak or damaged part of an artery wall. The wall bulges out from the normal force of blood pumping through the body. Tell a health care provider about: Any allergies you have. All medicines you are taking, including vitamins, herbs, eye drops, creams, and over-the-counter medicines. Any blood disorders you have. Any surgeries you have had. Any medical conditions you have. Whether you are pregnant or may be pregnant. What are the risks? Generally, this is a safe test. However, problems may occur, including an allergic reaction to dye (contrast) that may be used during the test. What happens before the test? No specific  preparation is needed. You may eat and drink normally. What happens during the test?  You will take off your clothes from the waist up and put on a hospital gown. Electrodes or electrocardiogram (ECG)patches may be placed on your chest. The electrodes or patches are then connected to a device that monitors your heart rate and rhythm. You will lie down on a table for an ultrasound exam. A gel will be applied to your chest to help sound waves pass through your skin. A handheld device, called a transducer, will be pressed against your chest and moved over your heart. The transducer produces sound waves that travel to your heart and bounce back (or "echo" back) to the transducer. These sound waves will be captured in real-time and changed into images of your heart that can be viewed on a video monitor. The images will be recorded on a computer and reviewed by your health care provider. You may be asked to change positions or hold your breath for a short time. This makes it easier to get different views or better views of your heart. In  some cases, you may receive contrast through an IV in one of your veins. This can improve the quality of the pictures from your heart. The procedure may vary among health care providers and hospitals. What can I expect after the test? You may return to your normal, everyday life, including diet, activities, and medicines, unless your health care provider tells you not to do that. Follow these instructions at home: It is up to you to get the results of your test. Ask your health care provider, or the department that is doing the test, when your results will be ready. Keep all follow-up visits. This is important. Summary An echocardiogram is a test that uses sound waves (ultrasound) to produce images of the heart. Images from an echocardiogram can provide important information about the size and shape of your heart, heart muscle function, heart valve function, and other  possible heart problems. You do not need to do anything to prepare before this test. You may eat and drink normally. After the echocardiogram is completed, you may return to your normal, everyday life, unless your health care provider tells you not to do that. This information is not intended to replace advice given to you by your health care provider. Make sure you discuss any questions you have with your health care provider. Document Revised: 11/27/2020 Document Reviewed: 11/07/2019 Elsevier Patient Education  2022 [employer].

## 2021-04-10 ENCOUNTER — Telehealth: Payer: Self-pay | Admitting: *Deleted

## 2021-04-10 MED ORDER — REPATHA SURECLICK 140 MG/ML ~~LOC~~ SOAJ: 140.0000 mg | SUBCUTANEOUS | 6 refills | Status: DC

## 2021-04-10 NOTE — Telephone Encounter (Signed)
I called and spoke with the patient. She is aware of Pharm D- feedback as stated below regarding the cost of the Repatha injections.  The patient is agreeable to starting with a 1 month supply to see how she tolerates.  She is aware I will send this to the [facility] in [place], but it will require a prior authorization, so she does not need to worry about trying to pick this up today.  I have also advised her that once she obtains this, if she requires any assistance with education on the injections to please call and let us know as we can set her up to see a nurse to assist with the first injection.  The patient voices understanding and is agreeable.  She was very appreciative of the call back.  To Ignacia Bayley, NP as an Juluis Rainier.

## 2021-04-10 NOTE — Telephone Encounter (Signed)
-----   Message from Rockne Menghini, [place] sent at 04/09/2021 10:17 AM EST ----- Deborah Gibson  According to the [facility] website she would pay $45/month or $125/3 month supply.  When she gets to the coverage gap, her price would go up to about $140/month.  Repatha is the preferred product on her plan, she will need a PA  Deborah Gibson ----- Message ----- From: Emily Filbert, RN Sent: 04/09/2021   9:41 AM EST To: Valora Corporal, RN, Emily Filbert, RN, #  Good morning! Looking for some help with this patient.  I'm mainly with EP so I don't do a lot of Repatha, but Ignacia Bayley saw this patient this morning in [place]. Repatha was previously discussed with her in 2022, but her insurance would not cover this.  She has new insurance and this was updated in her chart this morning. She would like to see if her insurance will now cover [place].  Are you able to look at her insurance and tell/ already know if they cover this?   If looks like [employer].   Thank you! Deborah Gibson

## 2021-04-11 DIAGNOSIS — R55 Syncope and collapse: Secondary | ICD-10-CM

## 2021-04-12 DIAGNOSIS — R55 Syncope and collapse: Secondary | ICD-10-CM | POA: Diagnosis not present

## 2021-04-18 NOTE — Telephone Encounter (Signed)
Prior Authorization forms for Repatha 944 MG/ML [employer] faxed to [facility] @ [phone] & [phone].  Awaiting approval.

## 2021-04-21 NOTE — Telephone Encounter (Signed)
Received fax stating Repatha has been approved until 10/16/2021

## 2021-04-21 NOTE — ED Provider Notes (Signed)
[facility] Provider Note    Event Date/Time   First MD Initiated Contact with Patient 04/06/21 1303     (approximate)   History   Loss of Consciousness and Dizziness   HPI  Deborah Gibson is a 88 y.o. female who presents after a syncopal episode while at church today.  She notes this is happened before.  She denies any chest pain or palpitations.  No nausea or vomiting.  Feels at her baseline now.  No injuries reported.  No new medications reported     Physical Exam   Triage Vital Signs: ED Triage Vitals  Enc Vitals Group     BP 04/06/21 1139 (!) 167/55     Pulse Rate 04/06/21 1139 (!) 59     Resp 04/06/21 1139 20     Temp 04/06/21 1139 98 F (36.7 C)     Temp Source 04/06/21 1139 Oral     SpO2 04/06/21 1139 99 %     Weight 04/06/21 1140 72.6 kg (160 lb)     Height 04/06/21 1140 1.664 m (5' 5.5")     Head Circumference --      Peak Flow --      Pain Score 04/06/21 1139 10     Pain Loc --      Pain Edu? --      Excl. in [place]? --     Most recent vital signs: Vitals:   04/06/21 1139  BP: (!) 167/55  Pulse: (!) 59  Resp: 20  Temp: 98 F (36.7 C)  SpO2: 99%     General: Awake, no distress.  CV:  Good peripheral perfusion.  Regular rate and rhythm, no murmurs Resp:  Normal effort.  Abd:  No distention.  Other:  No calf pain or swelling   ED Results / Procedures / Treatments   Labs (all labs ordered are listed, but only abnormal results are displayed) Labs Reviewed  BASIC METABOLIC PANEL - Abnormal; Notable for the following components:      Result Value   Sodium 132 (*)    CO2 20 (*)    Glucose, Bld 136 (*)    All other components within normal limits  CBC - Abnormal; Notable for the following components:   Hemoglobin 11.8 (*)    All other components within normal limits     EKG  ED ECG REPORT I, Lavonia Drafts, the attending physician, personally viewed and interpreted this ECG.  Date: 04/21/2021  Rhythm: Sinus  rhythm  QRS Axis: normal Intervals: normal ST/T Wave abnormalities: normal Narrative Interpretation: no evidence of acute ischemia    RADIOLOGY  CT head reviewed by me, no acute abnormality   PROCEDURES:  Critical Care performed:   .1-3 Lead EKG Interpretation Performed by: Lavonia Drafts, MD Authorized by: Lavonia Drafts, MD     Interpretation: normal     ECG rate assessment: normal     Rhythm: sinus rhythm     Ectopy: none     Conduction: normal     MEDICATIONS ORDERED IN ED: Medications - No data to display   IMPRESSION / MDM / [place] / ED COURSE  I reviewed the triage vital signs and the nursing notes.  Patient presents for a syncopal episode as detailed above, she says this is occurred before and similar circumstances.  She is asymptomatic and well-appearing here.  Her EKG is overall not significantly changed from prior.  Exam is reassuring.   Lab work demonstrates normal  CBC, mild hyponatremia on BMP and slightly elevated BUN to creatinine ratio  Considered admission but because the patient is feeling well, has reassuring work-up and EKG here with good outpatient follow-up appropriate for discharge with follow-up with cardiology, return precautions discussed, patient agrees with this plan.        FINAL CLINICAL IMPRESSION(S) / ED DIAGNOSES   Final diagnoses:  Near syncope     Rx / DC Orders   ED Discharge Orders     None        Note:  This document was prepared using Dragon voice recognition software and may include unintentional dictation errors.   Lavonia Drafts, MD 04/21/21 [phone]

## 2021-05-02 ENCOUNTER — Other Ambulatory Visit: Payer: Self-pay

## 2021-05-02 ENCOUNTER — Ambulatory Visit (INDEPENDENT_AMBULATORY_CARE_PROVIDER_SITE_OTHER): Payer: Medicare HMO

## 2021-05-02 DIAGNOSIS — R55 Syncope and collapse: Secondary | ICD-10-CM | POA: Diagnosis not present

## 2021-05-02 LAB — ECHOCARDIOGRAM COMPLETE
AR max vel: 2.36 cm2
AV Area VTI: 2.21 cm2
AV Area mean vel: 2.03 cm2
AV Mean grad: 5 mmHg
AV Peak grad: 7.8 mmHg
AV Vena cont: 0.43 cm
Ao pk vel: 1.4 m/s
Area-P 1/2: 3.34 cm2
Calc EF: 58.8 %
P 1/2 time: 428 ms
S' Lateral: 3.4 cm
Single Plane A2C EF: 62.6 %
Single Plane A4C EF: 55.3 %

## 2021-05-05 ENCOUNTER — Telehealth: Payer: Self-pay | Admitting: *Deleted

## 2021-05-05 NOTE — Telephone Encounter (Signed)
Left voicemail message to call back for review of results and her questions.

## 2021-05-05 NOTE — Telephone Encounter (Signed)
-----   Message from Theora Gianotti, NP sent at 05/03/2021 11:57 AM EST ----- Normal heart squeezing function with moderate stiffness to the heart muscle.  The mitral valve is mildly to moderately leaky.  The aortic valve is mildly leaky.  Overall, no significant change since 06/2017 study.

## 2021-05-05 NOTE — Telephone Encounter (Signed)
Patient calling to discuss recent testing results   Please call also to discuss :    Pt c/o medication issue:  1. Name of Medication: repatha  2. How are you currently taking this medication (dosage and times per day)? 140 mg inj q 14 days   3. Are you having a reaction (difficulty breathing--STAT)? Diarrhea muscle pain cough   4. What is your medication issue? Patient checking on side effects of new med

## 2021-05-05 NOTE — Telephone Encounter (Signed)
Left voicemail message to call back for review of results.  

## 2021-05-06 NOTE — Telephone Encounter (Signed)
Patient returning call.

## 2021-05-06 NOTE — Telephone Encounter (Signed)
Results reviewed with patient and she verbalized understanding. She also reports that since starting the Repatha she has developed diarrhea, muscle pain, and cough. She reports these side effects and advised I would update provider and our pharmacy team to see if they have any further recommendations. She verbalized understanding with no further questions at this time.

## 2021-05-06 NOTE — Telephone Encounter (Signed)
It sounds like she is going to have to skip her next dose of Repatha to see if symptoms improve.  If so, we will need to discontinue and consider an alternate treatment plan.

## 2021-05-08 NOTE — Telephone Encounter (Signed)
Spoke with patient and reviewed recommendations to skip the next dose in order to see if her symptoms subside. She verbalized understanding of recommendations and will call us back after those 2 weeks to see if we can restart or if we need to look at other alternatives.

## 2021-05-15 ENCOUNTER — Ambulatory Visit: Payer: Self-pay | Admitting: *Deleted

## 2021-05-15 DIAGNOSIS — J189 Pneumonia, unspecified organism: Secondary | ICD-10-CM | POA: Diagnosis not present

## 2021-05-15 DIAGNOSIS — R0602 Shortness of breath: Secondary | ICD-10-CM | POA: Diagnosis not present

## 2021-05-15 NOTE — Telephone Encounter (Signed)
°  Chief Complaint: shortness of breath and wheezing requesting appt. Symptoms: wheezing, shortness of breath with exertion, feeling tired, coughing , runny nose yellow drainage, nonformed stools, congested sounding cough. Has not tested for covid  has had a fever but not now  Frequency: a few days Pertinent Negatives: Patient denies chest pain , difficulty breathing  Disposition: [] ED /[x] Urgent Care (no appt availability in office) / [] Appointment(In office/virtual)/ []  [facility]/ [] Home Care/ [] Refused Recommended Disposition /[] [place] [facility]/ []  Follow-up with PCP Additional Notes:   Instructed patient to go to [facility] or ED now. Wheezing noted .     Reason for Disposition  [1] MILD difficulty breathing (e.g., minimal/no SOB at rest, SOB with walking, pulse <100) AND [2] NEW-onset or WORSE than normal  Answer Assessment - Initial Assessment Questions 1. RESPIRATORY STATUS: "Describe your breathing?" (e.g., wheezing, shortness of breath, unable to speak, severe coughing)      Wheezing , shortness of breath with exertion 2. ONSET: "When did this breathing problem begin?"       Few days  3. PATTERN "Does the difficult breathing come and go, or has it been constant since it started?"      Comes and goes worse at times  4. SEVERITY: "How bad is your breathing?" (e.g., mild, moderate, severe)    - MILD: No SOB at rest, mild SOB with walking, speaks normally in sentences, can lie down, no retractions, pulse < 100.    - MODERATE: SOB at rest, SOB with minimal exertion and prefers to sit, cannot lie down flat, speaks in phrases, mild retractions, audible wheezing, pulse 100-120.    - SEVERE: Very SOB at rest, speaks in single words, struggling to breathe, sitting hunched forward, retractions, pulse > 120      Moderate  5. RECURRENT SYMPTOM: "Have you had difficulty breathing before?" If Yes, ask: "When was the last time?" and "What happened that time?"      No  6. CARDIAC  HISTORY: "Do you have any history of heart disease?" (e.g., heart attack, angina, bypass surgery, angioplasty)      Heart issues  7. LUNG HISTORY: "Do you have any history of lung disease?"  (e.g., pulmonary embolus, asthma, emphysema)     na 8. CAUSE: "What do you think is causing the breathing problem?"      Not sure , has not tested for covid  9. OTHER SYMPTOMS: "Do you have any other symptoms? (e.g., dizziness, runny nose, cough, chest pain, fever)     Runny nose yellow drainage , dizziness at times, cough productive white fever 99 not 97. 10. O2 SATURATION MONITOR:  "Do you use an oxygen saturation monitor (pulse oximeter) at home?" If Yes, "What is your reading (oxygen level) today?" "What is your usual oxygen saturation reading?" (e.g., 95%)       Na  11. PREGNANCY: "Is there any chance you are pregnant?" "When was your last menstrual period?"       na 12. TRAVEL: "Have you traveled out of the country in the last month?" (e.g., travel history, exposures)       na  Protocols used: Breathing Difficulty-A-AH

## 2021-05-21 ENCOUNTER — Ambulatory Visit: Payer: Medicare HMO | Admitting: Cardiovascular Disease

## 2021-05-21 ENCOUNTER — Encounter: Payer: Self-pay | Admitting: Cardiovascular Disease

## 2021-05-21 ENCOUNTER — Other Ambulatory Visit: Payer: Self-pay

## 2021-05-21 VITALS — BP 100/50 | HR 62 | Ht 65.5 in | Wt 160.2 lb

## 2021-05-21 DIAGNOSIS — E782 Mixed hyperlipidemia: Secondary | ICD-10-CM | POA: Diagnosis not present

## 2021-05-21 DIAGNOSIS — I25118 Atherosclerotic heart disease of native coronary artery with other forms of angina pectoris: Secondary | ICD-10-CM | POA: Diagnosis not present

## 2021-05-21 DIAGNOSIS — I739 Peripheral vascular disease, unspecified: Secondary | ICD-10-CM

## 2021-05-21 DIAGNOSIS — Z789 Other specified health status: Secondary | ICD-10-CM

## 2021-05-21 DIAGNOSIS — I10 Essential (primary) hypertension: Secondary | ICD-10-CM
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
## 2021-05-21 NOTE — Patient Instructions (Addendum)
Medication Instructions:  No changes  If you need a refill on your cardiac medications before your next appointment, please call your pharmacy.   Lab work: No new labs needed  Testing/Procedures: No new testing needed  Follow-Up: At [facility], you and your health needs are our priority.  As part of our continuing mission to provide you with exceptional heart care, we have created designated Provider Care Teams.  These Care Teams include your primary Cardiologist (physician) and Advanced Practice Providers (APPs -  Physician Assistants and Nurse Practitioners) who all work together to provide you with the care you need, when you need it.  You will need a follow up appointment in 6 months with APP  Providers on your designated Care Team:   Murray Hodgkins, NP Christell Faith, PA-C Cadence Kathlen Mody, [place]  COVID-19 Vaccine Information can be found at: [email or web address].uk For questions related to vaccine distribution or appointments, please email [email or web address] or call [phone].

## 2021-05-28 ENCOUNTER — Telehealth: Payer: Self-pay | Admitting: Internal Medicine

## 2021-05-28 NOTE — Telephone Encounter (Signed)
Copied from [place] [id number]. Topic: General - Other ?>> May 28, 2021  2:55 PM Leward Quan A wrote: ?Reason for CRM: Patient need a call back to reschedule her [place] since she is not available on Mondays. Please call to schedule Ph#  [phone] ?

## 2021-06-02 ENCOUNTER — Ambulatory Visit: Payer: Medicare HMO

## 2021-06-04 ENCOUNTER — Ambulatory Visit (INDEPENDENT_AMBULATORY_CARE_PROVIDER_SITE_OTHER): Payer: Medicare HMO | Admitting: *Deleted

## 2021-06-04 DIAGNOSIS — Z0000 Encounter for general adult medical examination without abnormal findings: Secondary | ICD-10-CM | POA: Diagnosis not present

## 2021-06-04 NOTE — Patient Instructions (Signed)
[name] , Thank you for taking time to come for your Medicare Wellness Visit. I appreciate your ongoing commitment to your health goals. Please review the following plan we discussed and let me know if I can assist you in the future.   Screening recommendations/referrals: Colonoscopy: no longer required Mammogram: no longer required   Bone Density: up to date Recommended yearly ophthalmology/optometry visit for glaucoma screening and checkup Recommended yearly dental visit for hygiene and checkup  Vaccinations: Influenza vaccine: up to date Pneumococcal vaccine: up to date Tdap vaccine: Education provided Shingles vaccine: Education provided    Advanced directives: Education provided  Conditions/risks identified:   Next appointment: 06-24-2021 9:20 [place]   Preventive Care 15 Years and Older, Female Preventive care refers to lifestyle choices and visits with your health care provider that can promote health and wellness. What does preventive care include? A yearly physical exam. This is also called an annual well check. Dental exams once or twice a year. Routine eye exams. Ask your health care provider how often you should have your eyes checked. Personal lifestyle choices, including: Daily care of your teeth and gums. Regular physical activity. Eating a healthy diet. Avoiding tobacco and drug use. Limiting alcohol use. Practicing safe sex. Taking low-dose aspirin every day. Taking vitamin and mineral supplements as recommended by your health care provider. What happens during an annual well check? The services and screenings done by your health care provider during your annual well check will depend on your age, overall health, lifestyle risk factors, and family history of disease. Counseling  Your health care provider may ask you questions about your: Alcohol use. Tobacco use. Drug use. Emotional well-being. Home and relationship well-being. Sexual activity. Eating  habits. History of falls. Memory and ability to understand (cognition). Work and work Statistician. Reproductive health. Screening  You may have the following tests or measurements: Height, weight, and BMI. Blood pressure. Lipid and cholesterol levels. These may be checked every 5 years, or more frequently if you are over 25 years old. Skin check. Lung cancer screening. You may have this screening every year starting at age 91 if you have a 30-pack-year history of smoking and currently smoke or have quit within the past 15 years. Fecal occult blood test (FOBT) of the stool. You may have this test every year starting at age 76. Flexible sigmoidoscopy or colonoscopy. You may have a sigmoidoscopy every 5 years or a colonoscopy every 10 years starting at age 72. Hepatitis C blood test. Hepatitis B blood test. Sexually transmitted disease (STD) testing. Diabetes screening. This is done by checking your blood sugar (glucose) after you have not eaten for a while (fasting). You may have this done every 1-3 years. Bone density scan. This is done to screen for osteoporosis. You may have this done starting at age 29. Mammogram. This may be done every 1-2 years. Talk to your health care provider about how often you should have regular mammograms. Talk with your health care provider about your test results, treatment options, and if necessary, the need for more tests. Vaccines  Your health care provider may recommend certain vaccines, such as: Influenza vaccine. This is recommended every year. Tetanus, diphtheria, and acellular pertussis (Tdap, Td) vaccine. You may need a Td booster every 10 years. Zoster vaccine. You may need this after age 18. Pneumococcal 13-valent conjugate (PCV13) vaccine. One dose is recommended after age 52. Pneumococcal polysaccharide (PPSV23) vaccine. One dose is recommended after age 35. Talk to your health care provider  about which screenings and vaccines you need and how  often you need them. This information is not intended to replace advice given to you by your health care provider. Make sure you discuss any questions you have with your health care provider. Document Released: 04/12/2015 Document Revised: 12/04/2015 Document Reviewed: 01/15/2015 Elsevier Interactive Patient Education  2017 [employer] Prevention in the Home Falls can cause injuries. They can happen to people of all ages. There are many things you can do to make your home safe and to help prevent falls. What can I do on the outside of my home? Regularly fix the edges of walkways and driveways and fix any cracks. Remove anything that might make you trip as you walk through a door, such as a raised step or threshold. Trim any bushes or trees on the path to your home. Use bright outdoor lighting. Clear any walking paths of anything that might make someone trip, such as rocks or tools. Regularly check to see if handrails are loose or broken. Make sure that both sides of any steps have handrails. Any raised decks and porches should have guardrails on the edges. Have any leaves, snow, or ice cleared regularly. Use sand or salt on walking paths during winter. Clean up any spills in your garage right away. This includes oil or grease spills. What can I do in the bathroom? Use night lights. Install grab bars by the toilet and in the tub and shower. Do not use towel bars as grab bars. Use non-skid mats or decals in the tub or shower. If you need to sit down in the shower, use a plastic, non-slip stool. Keep the floor dry. Clean up any water that spills on the floor as soon as it happens. Remove soap buildup in the tub or shower regularly. Attach bath mats securely with double-sided non-slip rug tape. Do not have throw rugs and other things on the floor that can make you trip. What can I do in the bedroom? Use night lights. Make sure that you have a light by your bed that is easy to  reach. Do not use any sheets or blankets that are too big for your bed. They should not hang down onto the floor. Have a firm chair that has side arms. You can use this for support while you get dressed. Do not have throw rugs and other things on the floor that can make you trip. What can I do in the kitchen? Clean up any spills right away. Avoid walking on wet floors. Keep items that you use a lot in easy-to-reach places. If you need to reach something above you, use a strong step stool that has a grab bar. Keep electrical cords out of the way. Do not use floor polish or wax that makes floors slippery. If you must use wax, use non-skid floor wax. Do not have throw rugs and other things on the floor that can make you trip. What can I do with my stairs? Do not leave any items on the stairs. Make sure that there are handrails on both sides of the stairs and use them. Fix handrails that are broken or loose. Make sure that handrails are as long as the stairways. Check any carpeting to make sure that it is firmly attached to the stairs. Fix any carpet that is loose or worn. Avoid having throw rugs at the top or bottom of the stairs. If you do have throw rugs, attach them to the floor  with carpet tape. Make sure that you have a light switch at the top of the stairs and the bottom of the stairs. If you do not have them, ask someone to add them for you. What else can I do to help prevent falls? Wear shoes that: Do not have high heels. Have rubber bottoms. Are comfortable and fit you well. Are closed at the toe. Do not wear sandals. If you use a stepladder: Make sure that it is fully opened. Do not climb a closed stepladder. Make sure that both sides of the stepladder are locked into place. Ask someone to hold it for you, if possible. Clearly mark and make sure that you can see: Any grab bars or handrails. First and last steps. Where the edge of each step is. Use tools that help you move  around (mobility aids) if they are needed. These include: Canes. Walkers. Scooters. Crutches. Turn on the lights when you go into a dark area. Replace any light bulbs as soon as they burn out. Set up your furniture so you have a clear path. Avoid moving your furniture around. If any of your floors are uneven, fix them. If there are any pets around you, be aware of where they are. Review your medicines with your doctor. Some medicines can make you feel dizzy. This can increase your chance of falling. Ask your doctor what other things that you can do to help prevent falls. This information is not intended to replace advice given to you by your health care provider. Make sure you discuss any questions you have with your health care provider. Document Released: 01/10/2009 Document Revised: 08/22/2015 Document Reviewed: 04/20/2014 Elsevier Interactive Patient Education  2017 [employer].

## 2021-06-04 NOTE — Progress Notes (Signed)
Subjective:   Deborah Gibson is a 88 y.o. female who presents for Medicare Annual (Subsequent) preventive examination.  I connected with  Deborah Gibson on 06/04/21 by a telephone enabled telemedicine application and verified that I am speaking with the correct person using two identifiers.   I discussed the limitations of evaluation and management by telemedicine. The patient expressed understanding and agreed to proceed.  Patient location: home  Provider location: Tele-Health not in office     Review of Systems     Cardiac Risk Factors include: advanced age (>58mn, >>34women);sedentary lifestyle;hypertension;family history of premature cardiovascular disease     Objective:    Today's Vitals   There is no height or weight on file to calculate BMI.  Advanced Directives 06/04/2021 04/06/2021 01/31/2021 12/29/2020 05/31/2020 05/24/2019 11/09/2017  Does Patient Have a Medical Advance Directive? No No No No No No No  Would patient like information on creating a medical advance directive? No - Patient declined - - - - - -    Current Medications (verified) Outpatient Encounter Medications as of 06/04/2021  Medication Sig   albuterol (VENTOLIN HFA) 108 (90 Base) MCG/ACT inhaler SMARTSIG:2 Puff(s) By Mouth Every 4-6 Hours PRN   amoxicillin-clavulanate (AUGMENTIN) 875-125 MG tablet SMARTSIG:1 Tablet(s) By Mouth Every 12 Hours   aspirin EC 81 MG tablet Take 81 mg by mouth daily.   carvedilol (COREG) 25 MG tablet TAKE 1 TABLET (25 MG TOTAL) BY MOUTH 2 (TWO) TIMES DAILY WITH A MEAL.   cetirizine (ZYRTEC) 10 MG tablet Take 10 mg by mouth daily as needed.    COD LIVER OIL PO Take by mouth daily.    Coenzyme Q10 (COQ10) 200 MG CAPS Take 1 capsule by mouth daily.    COLLAGEN PO Take by mouth.   Evolocumab (REPATHA SURECLICK) 1347MG/ML SOAJ Inject 140 mg into the skin every 14 (fourteen) days.   hydrALAZINE (APRESOLINE) 50 MG tablet Take 25 to 50 mg, up to three times a day as needed for blood  pressures >180   meclizine (ANTIVERT) 25 MG tablet Take 1 tablet (25 mg total) by mouth 3 (three) times daily as needed for dizziness.   meloxicam (MOBIC) 7.5 MG tablet Take 7.5 mg by mouth as needed for pain.   NON FORMULARY daily. CBD oil and capsules   pantoprazole (PROTONIX) 40 MG tablet TAKE 1 TABLET BY MOUTH EVERY DAY   predniSONE (DELTASONE) 20 MG tablet Take 20 mg by mouth daily with breakfast.   TURMERIC CURCUMIN PO Take by mouth daily.   VITAMIN A PO Take 300 mg by mouth daily.   vitamin B-12 (CYANOCOBALAMIN) 500 MCG tablet Take 500 mcg by mouth daily.   VITAMIN D, ERGOCALCIFEROL, PO Take by mouth.   losartan (COZAAR) 25 MG tablet Take 1 tablet (25 mg total) by mouth daily.   No facility-administered encounter medications on file as of 06/04/2021.    Allergies (verified) Scopolamine, Amlodipine, Amlodipine, Benazepril, Codeine, Crestor [rosuvastatin], Lovastatin, Scopolamine, Statins, and Zocor [simvastatin]   History: Past Medical History:  Diagnosis Date   Arthritis    CAD (coronary artery disease)    a. 04/2014 Cath: EF >55%, diffuse minor irregularities.   Carotid arterial disease (HCC)    a. Carotid U/S: RICA 442-59% LICA 656-38%   Diastolic dysfunction    a. 06/2017 Echo: EF 60-65%, mod LVH, Gr2DD, mild AI/MR. Mildly dil LA. Nl RV fxn. PASP 30-374mg.   GERD (gastroesophageal reflux disease)    Hyperlipidemia    Hypertension  Orthostatic hypotension    a. 12/2020 w/ syncop-->HCTZ discontinued.   PAD (peripheral artery disease) ([place])    a. 12/2020 ABIs/Duplex: R 0.77 RCFA 30-49, RSFA 50-74, L 0.66, L Iliac 50-74, LCFA 30-49, LSFA 50-74.   Seasonal allergies    Skin cancer    Syncope    a. 12/2020 2/2 orthostasis.   Past Surgical History:  Procedure Laterality Date   BLADDER SUSPENSION     CARDIAC CATHETERIZATION     [place], [place]   CATARACT EXTRACTION     CORONARY ANGIOPLASTY  03/30/2014   CYSTOCELE REPAIR     ESOPHAGOGASTRODUODENOSCOPY (EGD) WITH  PROPOFOL N/A 03/12/2016   Procedure: ESOPHAGOGASTRODUODENOSCOPY (EGD) WITH PROPOFOL;  Surgeon: Jonathon Bellows, MD;  Location: [facility] ENDOSCOPY;  Service: Endoscopy;  Laterality: N/A;   EYE SURGERY     cataract surgery   rectal vaginal fistula repair     TONSILLECTOMY     TOTAL ABDOMINAL HYSTERECTOMY     VAGINAL HYSTERECTOMY     Family History  Problem Relation Age of Onset   Hypertension Mother    Heart failure Mother    Hyperlipidemia Mother    Stroke Mother    Heart disease Father    Cancer Sister    Heart disease Sister    Hypertension Sister    Diabetes Brother    Heart disease Brother        pacer/defiib   Hypertension Brother    Heart attack Brother    Heart disease Brother        CABG   Leukemia Daughter    Social History   Socioeconomic History   Marital status: Widowed    Spouse name: Not on file   Number of children: Not on file   Years of education: Not on file   Highest education level: Not on file  Occupational History   Not on file  Tobacco Use   Smoking status: Former    Packs/day: 3.00    Years: 23.00    Pack years: 69.00    Types: Cigarettes    Quit date: 04/15/1978    Years since quitting: 43.1   Smokeless tobacco: Never  Vaping Use   Vaping Use: Never used  Substance and Sexual Activity   Alcohol use: No    Alcohol/week: 0.0 standard drinks   Drug use: No   Sexual activity: Not on file  Other Topics Concern   Not on file  Social History Narrative   ** Merged History Encounter **       Social Determinants of Health   Financial Resource Strain: Low Risk    Difficulty of Paying Living Expenses: Not hard at all  Food Insecurity: Not on file  Transportation Needs: Not on file  Physical Activity: Not on file  Stress: No Stress Concern Present   Feeling of Stress : Not at all  Social Connections: Moderately Integrated   Frequency of Communication with Friends and Family: More than three times a week   Frequency of Social Gatherings with  Friends and Family: More than three times a week   Attends Religious Services: More than 4 times per year   Active Member of [employer] or Organizations: Yes   Attends Archivist Meetings: More than 4 times per year   Marital Status: Widowed    Tobacco Counseling Counseling given: Not Answered   Clinical Intake:  Pre-visit preparation completed: Yes  Pain : No/denies pain     Nutritional Risks: None Diabetes: No  How often do you  need to have someone help you when you read instructions, pamphlets, or other written materials from your doctor or pharmacy?: 1 - Never  Diabetic?  no  Interpreter Needed?: No  Information entered by :: Leroy Kennedy  LPN   Activities of Daily Living In your present state of health, do you have any difficulty performing the following activities: 06/04/2021  Hearing? N  Vision? N  Difficulty concentrating or making decisions? N  Walking or climbing stairs? N  Dressing or bathing? N  Doing errands, shopping? N  Preparing Food and eating ? N  Using the Toilet? N  In the past six months, have you accidently leaked urine? N  Do you have problems with loss of bowel control? N  Managing your Medications? N  Managing your Finances? N  Housekeeping or managing your Housekeeping? N  Some recent data might be hidden    Patient Care Team: Charlynne Cousins, MD as PCP - General Rockey Situ Kathlene November, MD as PCP - Cardiology (Cardiology) [name], MD (Dermatology) Wellington Hampshire, MD as Consulting Physician (Cardiology)  Indicate any recent Medical Services you may have received from other than [facility] providers in the past year (date may be approximate).     Assessment:   This is a routine wellness examination for Angie.  Hearing/Vision screen Hearing Screening - Comments:: No trouble hearing Vision Screening - Comments:: Not up date [facility]  Dietary issues and exercise activities discussed: Current Exercise Habits: The patient does not  participate in regular exercise at present, Exercise limited by: None identified   Goals Addressed             This Visit's Progress    Patient Stated       Continue current lifestyle       Depression Screen PHQ 2/9 Scores 10/25/2020 09/13/2020 08/02/2020 07/02/2020 05/31/2020 05/24/2019 10/27/2017  PHQ - 2 Score 0 0 0 0 0 0 0  PHQ- 9 Score - - - 0 - - -    Fall Risk Fall Risk  06/04/2021 06/04/2021 10/25/2020 09/13/2020 08/02/2020  Falls in the past year? 0 0 0 0 0  Number falls in past yr: 0 0 0 0 -  Injury with Fall? 0 0 0 0 0  Risk for fall due to : - - No Fall Risks No Fall Risks -  Follow up Falls evaluation completed;Falls prevention discussed Falls evaluation completed;Education provided;Falls prevention discussed Falls evaluation completed Falls evaluation completed Falls evaluation completed    FALL RISK PREVENTION PERTAINING TO THE HOME:  Any stairs in or around the home? No  If so, are there any without handrails? No  Home free of loose throw rugs in walkways, pet beds, electrical cords, etc? Yes  Adequate lighting in your home to reduce risk of falls? Yes   ASSISTIVE DEVICES UTILIZED TO PREVENT FALLS:  Life alert? No  Use of a cane, walker or w/c? No  Grab bars in the bathroom? No  Shower chair or bench in shower? No  Elevated toilet seat or a handicapped toilet? Yes   TIMED UP AND GO:  Was the test performed? No .    Cognitive Function:  Normal cognitive status assessed by direct observation by this Nurse Health Advisor. No abnormalities found.       6CIT Screen 05/31/2020 10/27/2017 10/14/2016  What Year? 0 points 0 points 0 points  What month? 0 points 0 points 0 points  What time? 0 points 0 points 0 points  Count back from 20  0 points 0 points 0 points  Months in reverse 0 points 0 points 0 points  Repeat phrase 0 points 0 points 0 points  Total Score 0 0 0    Immunizations Immunization History  Administered Date(s) Administered   Fluad Quad(high Dose  65+) 12/17/2020   Influenza, High Dose Seasonal PF 01/31/2016, 01/27/2017, 01/13/2018, 02/18/2019   Influenza,inj,Quad PF,6+ Mos 01/15/2015   Influenza-Unspecified 01/10/2014   Pneumococcal Conjugate-13 09/13/2013   Pneumococcal-Unspecified 03/30/2005    TDAP status: Due, Education has been provided regarding the importance of this vaccine. Advised may receive this vaccine at local pharmacy or Health Dept. Aware to provide a copy of the vaccination record if obtained from local pharmacy or Health Dept. Verbalized acceptance and understanding.  Flu Vaccine status: Up to date  Pneumococcal vaccine status: Up to date  Covid-19 vaccine status: Declined, Education has been provided regarding the importance of this vaccine but patient still declined. Advised may receive this vaccine at local pharmacy or Health Dept.or vaccine clinic. Aware to provide a copy of the vaccination record if obtained from local pharmacy or Health Dept. Verbalized acceptance and understanding.  Qualifies for Shingles Vaccine? Yes   Zostavax completed No   Shingrix Completed?: No.    Education has been provided regarding the importance of this vaccine. Patient has been advised to call insurance company to determine out of pocket expense if they have not yet received this vaccine. Advised may also receive vaccine at local pharmacy or Health Dept. Verbalized acceptance and understanding.  Screening Tests Health Maintenance  Topic Date Due   COVID-19 Vaccine (1) 06/20/2021 (Originally 04/29/1932)   Zoster Vaccines- Shingrix (1 of 2) 06/24/2021 (Originally 10/28/1983)   TETANUS/TDAP  07/02/2021 (Originally 10/27/1950)   Pneumonia Vaccine 96+ Years old (2 - PPSV23 if available, else PCV20) 03/26/2022 (Originally 09/14/2014)   INFLUENZA VACCINE  Completed   DEXA SCAN  Completed   HPV VACCINES  Aged Out    Health Maintenance  There are no preventive care reminders to display for this patient.   Colorectal cancer  screening: No longer required.     Bone Density status: Completed 2015. Results reflect: Bone density results: NORMAL. Repeat every 0 years.  Lung Cancer Screening: (Low Dose CT Chest recommended if Age 71-80 years, 30 pack-year currently smoking OR have quit w/in 15years.) does not qualify.   Lung Cancer Screening Referral:   Additional Screening:  Hepatitis C Screening: does not qualify;  Vision Screening: Recommended annual ophthalmology exams for early detection of glaucoma and other disorders of the eye. Is the patient up to date with their annual eye exam?  No  Who is the provider or what is the name of the office in which the patient attends annual eye exams? [facility] If pt is not established with a provider, would they like to be referred to a provider to establish care? No .   Dental Screening: Recommended annual dental exams for proper oral hygiene  Community Resource Referral / Chronic Care Management: CRR required this visit?  No   CCM required this visit?  No      Plan:     I have personally reviewed and noted the following in the patients chart:   Medical and social history Use of alcohol, tobacco or illicit drugs  Current medications and supplements including opioid prescriptions.  Functional ability and status Nutritional status Physical activity Advanced directives List of other physicians Hospitalizations, surgeries, and ER visits in previous 12 months Vitals Screenings to include cognitive, depression,  and falls Referrals and appointments  In addition, I have reviewed and discussed with patient certain preventive protocols, quality metrics, and best practice recommendations. A written personalized care plan for preventive services as well as general preventive health recommendations were provided to patient.     Leroy Kennedy, LPN   11/01/2776   Nurse Notes:

## 2021-06-17 ENCOUNTER — Other Ambulatory Visit: Payer: Medicare HMO

## 2021-06-17 ENCOUNTER — Other Ambulatory Visit: Payer: Self-pay

## 2021-06-17 DIAGNOSIS — I25118 Atherosclerotic heart disease of native coronary artery with other forms of angina pectoris: Secondary | ICD-10-CM

## 2021-06-17 DIAGNOSIS — E781 Pure hyperglyceridemia: Secondary | ICD-10-CM | POA: Diagnosis not present

## 2021-06-17 DIAGNOSIS — I739 Peripheral vascular disease, unspecified: Secondary | ICD-10-CM

## 2021-06-17 LAB — URINALYSIS, ROUTINE W REFLEX MICROSCOPIC
Bilirubin, UA: NEGATIVE
Glucose, UA: NEGATIVE
Nitrite, UA: NEGATIVE
Specific Gravity, UA: 1.02 (ref 1.005–1.030)
Urobilinogen, Ur: 1 mg/dL (ref 0.2–1.0)
pH, UA: 6 (ref 5.0–7.5)

## 2021-06-17 LAB — MICROSCOPIC EXAMINATION: WBC, UA: >30 /hpf — ABNORMAL HIGH (ref 0–5)

## 2021-06-17 LAB — BAYER DCA HB A1C WAIVED: HB A1C (BAYER DCA - WAIVED): 5.9 % — ABNORMAL HIGH (ref 4.8–5.6)

## 2021-06-18 LAB — COMPREHENSIVE METABOLIC PANEL
ALT: 12 IU/L (ref 0–32)
AST: 25 IU/L (ref 0–40)
Albumin/Globulin Ratio: 1.4 (ref 1.2–2.2)
Albumin: 4.1 g/dL (ref 3.6–4.6)
Alkaline Phosphatase: 98 IU/L (ref 44–121)
BUN/Creatinine Ratio: 18 (ref 12–28)
BUN: 17 mg/dL (ref 8–27)
Bilirubin Total: 0.4 mg/dL (ref 0.0–1.2)
CO2: 22 mmol/L (ref 20–29)
Calcium: 9.3 mg/dL (ref 8.7–10.3)
Chloride: 101 mmol/L (ref 96–106)
Creatinine, Ser: 0.92 mg/dL (ref 0.57–1.00)
Globulin, Total: 2.9 g/dL (ref 1.5–4.5)
Glucose: 113 mg/dL — ABNORMAL HIGH (ref 70–99)
Potassium: 4.3 mmol/L (ref 3.5–5.2)
Sodium: 139 mmol/L (ref 134–144)
Total Protein: 7 g/dL (ref 6.0–8.5)
eGFR: 60 mL/min/{1.73_m2} (ref >59)

## 2021-06-18 LAB — CBC WITH DIFFERENTIAL/PLATELET
Basophils Absolute: 0.1 10*3/uL (ref 0.0–0.2)
Basos: 1 %
EOS (ABSOLUTE): 0.5 10*3/uL — ABNORMAL HIGH (ref 0.0–0.4)
Eos: 6 %
Hematocrit: 39 % (ref 34.0–46.6)
Hemoglobin: 13 g/dL (ref 11.1–15.9)
Immature Grans (Abs): 0 10*3/uL (ref 0.0–0.1)
Immature Granulocytes: 0 %
Lymphocytes Absolute: 2.1 10*3/uL (ref 0.7–3.1)
Lymphs: 26 %
MCH: 29 pg (ref 26.6–33.0)
MCHC: 33.3 g/dL (ref 31.5–35.7)
MCV: 87 fL (ref 79–97)
Monocytes Absolute: 0.5 10*3/uL (ref 0.1–0.9)
Monocytes: 6 %
Neutrophils Absolute: 5 10*3/uL (ref 1.4–7.0)
Neutrophils: 61 %
Platelets: 248 10*3/uL (ref 150–450)
RBC: 4.48 x10E6/uL (ref 3.77–5.28)
RDW: 13.6 % (ref 11.7–15.4)
WBC: 8.2 10*3/uL (ref 3.4–10.8)

## 2021-06-18 LAB — LIPID PANEL
Chol/HDL Ratio: 4.1 ratio (ref 0.0–4.4)
Cholesterol, Total: 190 mg/dL (ref 100–199)
HDL: 46 mg/dL (ref >39)
LDL Chol Calc (NIH): 101 mg/dL — ABNORMAL HIGH (ref 0–99)
Triglycerides: 252 mg/dL — ABNORMAL HIGH (ref 0–149)
VLDL Cholesterol Cal: 43 mg/dL — ABNORMAL HIGH (ref 5–40)

## 2021-06-24 ENCOUNTER — Encounter: Payer: Self-pay | Admitting: Internal Medicine

## 2021-06-24 ENCOUNTER — Ambulatory Visit (INDEPENDENT_AMBULATORY_CARE_PROVIDER_SITE_OTHER): Payer: Medicare HMO | Admitting: Internal Medicine

## 2021-06-24 ENCOUNTER — Telehealth: Payer: Self-pay

## 2021-06-24 ENCOUNTER — Other Ambulatory Visit: Payer: Self-pay

## 2021-06-24 VITALS — BP 139/58 | HR 68 | Temp 97.8°F | Ht 65.0 in | Wt 164.0 lb

## 2021-06-24 DIAGNOSIS — K8689 Other specified diseases of pancreas: Secondary | ICD-10-CM

## 2021-06-24 DIAGNOSIS — E782 Mixed hyperlipidemia: Secondary | ICD-10-CM

## 2021-06-24 DIAGNOSIS — R1013 Epigastric pain: Secondary | ICD-10-CM | POA: Diagnosis not present

## 2021-06-24 DIAGNOSIS — I10 Essential (primary) hypertension: Secondary | ICD-10-CM

## 2021-06-24 DIAGNOSIS — Z1231 Encounter for screening mammogram for malignant neoplasm of breast: Secondary | ICD-10-CM

## 2021-06-24 DIAGNOSIS — K219 Gastro-esophageal reflux disease without esophagitis: Secondary | ICD-10-CM | POA: Diagnosis not present

## 2021-06-24 DIAGNOSIS — Z13820 Encounter for screening for osteoporosis: Secondary | ICD-10-CM | POA: Diagnosis not present

## 2021-06-24 NOTE — Telephone Encounter (Signed)
SCHEDULED FOR 06/26/2021 ?

## 2021-06-24 NOTE — Patient Instructions (Signed)
Please call to schedule your mammogram and/or bone density: ?[facility]  ?Address: [address], [place], [place] [address] ?Phone: [phone]  ?

## 2021-06-24 NOTE — Progress Notes (Signed)
? ?BP (!) 139/58   Pulse 68   Temp 97.8 ?F (36.6 ?C) (Oral)   Ht $R'5\' 5"'Om$  (1.651 m)   Wt 164 lb (74.4 kg)   SpO2 99%   BMI 27.29 kg/m?   ? ?Subjective:  ? ? Patient ID: Deborah Gibson, female    DOB: February 12, 1932, 88 y.o.   MRN: [id number] ? ?Chief Complaint  ?Patient presents with  ? PVD  ? Hyperlipidemia  ? Hypertension  ? ? ?HPI: ?Deborah Gibson is a 88 y.o. female ? ?Hyperlipidemia ?This is a chronic problem. The problem is uncontrolled. Pertinent negatives include no chest pain, focal sensory loss, focal weakness, leg pain, myalgias or shortness of breath.  ?Hypertension ?This is a chronic problem. The current episode started more than 1 month ago. The problem is controlled. Pertinent negatives include no anxiety, blurred vision, chest pain, headaches, malaise/fatigue, neck pain, orthopnea, palpitations or shortness of breath.  ?Abdominal Pain ?This is a new problem. The current episode started more than 1 month ago. The onset quality is gradual. The problem occurs intermittently. The problem has been waxing and waning. The pain is located in the epigastric region. The pain is at a severity of 6/10. Associated symptoms include diarrhea. Pertinent negatives include no anorexia, arthralgias, belching, constipation, dysuria, fever, flatus, frequency, headaches, hematuria, melena, myalgias, nausea, vomiting or weight loss.  ? ?Chief Complaint  ?Patient presents with  ? PVD  ? Hyperlipidemia  ? Hypertension  ? ? ?Relevant past medical, surgical, family and social history reviewed and updated as indicated. Interim medical history since our last visit reviewed. ?Allergies and medications reviewed and updated. ? ?Review of Systems  ?Constitutional:  Negative for activity change, appetite change, chills, fatigue, fever, malaise/fatigue and weight loss.  ?HENT:  Negative for congestion.   ?Eyes:  Negative for blurred vision and visual disturbance.  ?Respiratory:  Negative for apnea, cough, chest tightness, shortness  of breath and wheezing.   ?Cardiovascular:  Negative for chest pain, palpitations, orthopnea and leg swelling.  ?Gastrointestinal:  Positive for abdominal pain and diarrhea. Negative for abdominal distention, anal bleeding, anorexia, blood in stool, constipation, flatus, melena, nausea and vomiting.  ?Endocrine: Negative for cold intolerance, heat intolerance, polydipsia, polyphagia and polyuria.  ?Genitourinary:  Negative for difficulty urinating, dysuria, frequency, hematuria and urgency.  ?Musculoskeletal:  Negative for arthralgias, myalgias and neck pain.  ?Skin:  Negative for color change and rash.  ?Neurological:  Negative for dizziness, focal weakness, speech difficulty, weakness, light-headedness, numbness and headaches.  ?Psychiatric/Behavioral:  Negative for behavioral problems and confusion. The patient is not nervous/anxious.   ? ?Per HPI unless specifically indicated above ? ?   ?Objective:  ?  ?BP (!) 139/58   Pulse 68   Temp 97.8 ?F (36.6 ?C) (Oral)   Ht $R'5\' 5"'yV$  (1.651 m)   Wt 164 lb (74.4 kg)   SpO2 99%   BMI 27.29 kg/m?   ?Wt Readings from Last 3 Encounters:  ?06/24/21 164 lb (74.4 kg)  ?05/21/21 160 lb 4 oz (72.7 kg)  ?04/09/21 165 lb (74.8 kg)  ?  ?Physical Exam ?Vitals and nursing note reviewed.  ?Constitutional:   ?   General: She is not in acute distress. ?   Appearance: Normal appearance. She is not ill-appearing or diaphoretic.  ?Eyes:  ?   Conjunctiva/sclera: Conjunctivae normal.  ?Cardiovascular:  ?   Rate and Rhythm: Normal rate and regular rhythm.  ?   Heart sounds: No murmur heard. ?  No friction rub. No gallop.  ?  Pulmonary:  ?   Breath sounds: No rhonchi.  ?Abdominal:  ?   General: Abdomen is flat. Bowel sounds are normal. There is no distension.  ?   Palpations: Abdomen is soft. There is no mass.  ?   Tenderness: There is no abdominal tenderness. There is no guarding.  ?Skin: ?   General: Skin is warm and dry.  ?   Coloration: Skin is not jaundiced or pale.  ?   Findings: No  bruising, erythema, lesion or rash.  ?Neurological:  ?   Mental Status: She is alert.  ?Psychiatric:     ?   Mood and Affect: Mood normal.     ?   Behavior: Behavior normal.     ?   Thought Content: Thought content normal.     ?   Judgment: Judgment normal.  ? ? ?Results for orders placed or performed in visit on 06/17/21  ?Microscopic Examination  ? Urine  ?Result Value Ref Range  ? WBC, UA >30 (H) 0 - 5 /hpf  ? RBC 3-10 (A) 0 - 2 /hpf  ? Epithelial Cells (non renal) 0-10 0 - 10 /hpf  ? Bacteria, UA Many (A) None seen/Few  ?Lipid panel  ?Result Value Ref Range  ? Cholesterol, Total 190 100 - 199 mg/dL  ? Triglycerides 252 (H) 0 - 149 mg/dL  ? HDL 46 >39 mg/dL  ? VLDL Cholesterol Cal 43 (H) 5 - 40 mg/dL  ? LDL Chol Calc (NIH) 101 (H) 0 - 99 mg/dL  ? Chol/HDL Ratio 4.1 0.0 - 4.4 ratio  ?Urinalysis, Routine w reflex microscopic  ?Result Value Ref Range  ? Specific Gravity, UA 1.020 1.005 - 1.030  ? pH, UA 6.0 5.0 - 7.5  ? Color, UA Yellow Yellow  ? Appearance Ur Clear Clear  ? Leukocytes,UA 3+ (A) Negative  ? Protein,UA 2+ (A) Negative/Trace  ? Glucose, UA Negative Negative  ? Ketones, UA Trace (A) Negative  ? RBC, UA 2+ (A) Negative  ? Bilirubin, UA Negative Negative  ? Urobilinogen, Ur 1.0 0.2 - 1.0 mg/dL  ? Nitrite, UA Negative Negative  ? Microscopic Examination See below:   ?Bayer DCA Hb A1c Waived  ?Result Value Ref Range  ? HB A1C (BAYER DCA - WAIVED) 5.9 (H) 4.8 - 5.6 %  ?Comprehensive metabolic panel  ?Result Value Ref Range  ? Glucose 113 (H) 70 - 99 mg/dL  ? BUN 17 8 - 27 mg/dL  ? Creatinine, Ser 0.92 0.57 - 1.00 mg/dL  ? eGFR 60 >59 mL/min/1.73  ? BUN/Creatinine Ratio 18 12 - 28  ? Sodium 139 134 - 144 mmol/L  ? Potassium 4.3 3.5 - 5.2 mmol/L  ? Chloride 101 96 - 106 mmol/L  ? CO2 22 20 - 29 mmol/L  ? Calcium 9.3 8.7 - 10.3 mg/dL  ? Total Protein 7.0 6.0 - 8.5 g/dL  ? Albumin 4.1 3.6 - 4.6 g/dL  ? Globulin, Total 2.9 1.5 - 4.5 g/dL  ? Albumin/Globulin Ratio 1.4 1.2 - 2.2  ? Bilirubin Total 0.4 0.0 - 1.2  mg/dL  ? Alkaline Phosphatase 98 44 - 121 IU/L  ? AST 25 0 - 40 IU/L  ? ALT 12 0 - 32 IU/L  ?CBC with Differential/Platelet  ?Result Value Ref Range  ? WBC 8.2 3.4 - 10.8 x10E3/uL  ? RBC 4.48 3.77 - 5.28 x10E6/uL  ? Hemoglobin 13.0 11.1 - 15.9 g/dL  ? Hematocrit 39.0 34.0 - 46.6 %  ? MCV 87 79 -  97 fL  ? MCH 29.0 26.6 - 33.0 pg  ? MCHC 33.3 31.5 - 35.7 g/dL  ? RDW 13.6 11.7 - 15.4 %  ? Platelets 248 150 - 450 x10E3/uL  ? Neutrophils 61 Not Estab. %  ? Lymphs 26 Not Estab. %  ? Monocytes 6 Not Estab. %  ? Eos 6 Not Estab. %  ? Basos 1 Not Estab. %  ? Neutrophils Absolute 5.0 1.4 - 7.0 x10E3/uL  ? Lymphocytes Absolute 2.1 0.7 - 3.1 x10E3/uL  ? Monocytes Absolute 0.5 0.1 - 0.9 x10E3/uL  ? EOS (ABSOLUTE) 0.5 (H) 0.0 - 0.4 x10E3/uL  ? Basophils Absolute 0.1 0.0 - 0.2 x10E3/uL  ? Immature Granulocytes 0 Not Estab. %  ? Immature Grans (Abs) 0.0 0.0 - 0.1 x10E3/uL  ? ?   ? ? ?Current Outpatient Medications:  ?  carvedilol (COREG) 25 MG tablet, TAKE 1 TABLET (25 MG TOTAL) BY MOUTH 2 (TWO) TIMES DAILY WITH A MEAL., Disp: 180 tablet, Rfl: 4 ?  cetirizine (ZYRTEC) 10 MG tablet, Take 10 mg by mouth daily as needed. , Disp: , Rfl:  ?  COD LIVER OIL PO, Take by mouth daily. , Disp: , Rfl:  ?  Coenzyme Q10 (COQ10) 200 MG CAPS, Take 1 capsule by mouth daily. , Disp: , Rfl:  ?  COLLAGEN PO, Take by mouth., Disp: , Rfl:  ?  hydrALAZINE (APRESOLINE) 50 MG tablet, Take 25 to 50 mg, up to three times a day as needed for blood pressures >180, Disp: 180 tablet, Rfl: 3 ?  NON FORMULARY, daily. CBD oil and capsules, Disp: , Rfl:  ?  pantoprazole (PROTONIX) 40 MG tablet, TAKE 1 TABLET BY MOUTH EVERY DAY, Disp: 90 tablet, Rfl: 1 ?  TURMERIC CURCUMIN PO, Take by mouth daily., Disp: , Rfl:  ?  VITAMIN A PO, Take 300 mg by mouth daily., Disp: , Rfl:  ?  vitamin B-12 (CYANOCOBALAMIN) 500 MCG tablet, Take 500 mcg by mouth daily., Disp: , Rfl:  ?  VITAMIN D, ERGOCALCIFEROL, PO, Take by mouth., Disp: , Rfl:  ?  albuterol (VENTOLIN HFA) 108 (90 Base)  MCG/ACT inhaler, SMARTSIG:2 Puff(s) By Mouth Every 4-6 Hours PRN, Disp: , Rfl:  ?  aspirin EC 81 MG tablet, Take 81 mg by mouth daily., Disp: , Rfl:  ?  losartan (COZAAR) 25 MG tablet, Take 1 tablet (25 mg total) by mout

## 2021-06-26 ENCOUNTER — Ambulatory Visit (INDEPENDENT_AMBULATORY_CARE_PROVIDER_SITE_OTHER): Payer: Medicare HMO | Admitting: Gastroenterology

## 2021-06-26 ENCOUNTER — Encounter: Payer: Self-pay | Admitting: Gastroenterology

## 2021-06-26 VITALS — BP 101/61 | HR 76 | Temp 97.6°F | Ht 65.0 in | Wt 163.0 lb

## 2021-06-26 DIAGNOSIS — R197 Diarrhea, unspecified: Secondary | ICD-10-CM

## 2021-06-26 NOTE — Progress Notes (Signed)
? ? ?Gastroenterology Consultation ? ?Referring Provider:     Charlynne Cousins, MD ?Primary Care Physician:  Charlynne Cousins, MD ?Primary Gastroenterologist:  Dr. Allen Norris     ?Reason for Consultation:     Change in bowel habits ?      ? HPI:   ?Deborah Gibson is a 88 y.o. y/o female referred for consultation & management of change in bowel habits by Dr. Charlynne Cousins, MD. This patient comes in today with a report of having diarrhea.  The patient states that she had taken a shot that she states unclogs her arteries and resulted in diarrhea.  She cannot remember what the medication was but states it was not Lovenox or any blood thinner.  The patient was reporting that before taking this medication she was having diarrhea once a week and now she reports that she was having it nonstop after taking the injection but has no longer been taking the medication and states that she still has diarrhea.  She states that the diarrhea appears to have grease in it.  There is report that the patient has a lot of dairy in her diet but does not think that this is a problem since in her words she states she has been having diarrhea all of her life.  She has been taking Imodium which has stopped the diarrhea.  She has not had diarrhea in the last week. ?The patient reports that when she read the information sheet about the injection she was taking there was something about causing diarrhea in people who had pancreatic issues including stones and she recalled that she was told that she had pancreatic stones in the past although I do not see any imaging on record that shows her to have any issues with her pancreas.   ? ?Past Medical History:  ?Diagnosis Date  ? Arthritis   ? CAD (coronary artery disease)   ? a. 04/2014 Cath: EF >55%, diffuse minor irregularities.  ? Carotid arterial disease ([place])   ? a. Carotid U/S: RICA 94-17%, LICA 40-81%.  ? Diastolic dysfunction   ? a. 06/2017 Echo: EF 60-65%, mod LVH, Gr2DD, mild AI/MR. Mildly dil LA. Nl RV  fxn. PASP 30-36mHg.  ? GERD (gastroesophageal reflux disease)   ? Hyperlipidemia   ? Hypertension   ? Orthostatic hypotension   ? a. 12/2020 w/ syncop-->HCTZ discontinued.  ? PAD (peripheral artery disease) [place]   ? a. 12/2020 ABIs/Duplex: R 0.77 RCFA 30-49, RSFA 50-74, L 0.66, L Iliac 50-74, LCFA 30-49, LSFA 50-74.  ? Seasonal allergies   ? Skin cancer   ? Syncope   ? a. 12/2020 2/2 orthostasis.  ? ? ?Past Surgical History:  ?Procedure Laterality Date  ? BLADDER SUSPENSION    ? CARDIAC [place]HETER[place]    [place] ? CAT[place]ACT [place]TRA[place]    ? CORONARY ANGIOPLASTY  03/30/2014  ? CYSTOCELE REPAIR    ? ESOPHAGOGASTRODUODENOSCOPY (EGD) WITH PROPOFOL N/A 03/12/2016  ? Procedure: ESOPHAGOGASTRODUODENOSCOPY (EGD) WITH PROPOFOL;  Surgeon: KJonathon Bellows MD;  Location: [facility] ENDOSCOPY;  Service: Endoscopy;  Laterality: N/A;  ? EYE SURGERY    ? cataract surgery  ? rectal vaginal fistula repair    ? TONSILLECTOMY    ? TOTAL ABDOMINAL HYSTERECTOMY    ? VAGINAL HYSTERECTOMY    ? ? ?Prior to Admission medications   ?Medication Sig Start Date End Date Taking? Authorizing Provider  ?albuterol (VENTOLIN HFA) 108 (90 Base) MCG/ACT inhaler SMARTSIG:2 Puff(s) By Mouth Every 4-6 Hours PRN 05/15/21  [provider]  ?aspirin EC 81 MG tablet Take 81 mg by mouth daily.    [provider]  ?carvedilol (COREG) 25 MG tablet TAKE 1 TABLET (25 MG TOTAL) BY MOUTH 2 (TWO) TIMES DAILY WITH A MEAL. 07/24/20   Vigg, Avanti, MD  ?cetirizine (ZYRTEC) 10 MG tablet Take 10 mg by mouth daily as needed.     [provider]  ?COD LIVER OIL PO Take by mouth daily.     [provider]  ?Coenzyme Q10 (COQ10) 200 MG CAPS Take 1 capsule by mouth daily.     [provider]  ?COLLAGEN PO Take by mouth.    [provider]  ?hydrALAZINE (APRESOLINE) 50 MG tablet Take 25 to 50 mg, up to three times a day as needed for blood pressures >180 04/09/21   Theora Gianotti, NP  ?losartan (COZAAR) 25  MG tablet Take 1 tablet (25 mg total) by mouth daily. 01/09/21 05/21/21  Wellington Hampshire, MD  ?NON FORMULARY daily. CBD oil and capsules    [provider]  ?pantoprazole (PROTONIX) 40 MG tablet TAKE 1 TABLET BY MOUTH EVERY DAY 02/10/21   Venita Lick, NP  ?TURMERIC CURCUMIN PO Take by mouth daily.    [provider]  ?VITAMIN A PO Take 300 mg by mouth daily.    [provider]  ?vitamin B-12 (CYANOCOBALAMIN) 500 MCG tablet Take 500 mcg by mouth daily.    [provider]  ?VITAMIN D, ERGOCALCIFEROL, PO Take by mouth.    [provider]  ? ? ?Family History  ?Problem Relation Age of Onset  ? Hypertension Mother   ? Heart failure Mother   ? Hyperlipidemia Mother   ? Stroke Mother   ? Heart disease Father   ? Cancer Sister   ? Heart disease Sister   ? Hypertension Sister   ? Diabetes Brother   ? Heart disease Brother   ?     pacer/defiib  ? Hypertension Brother   ? Heart attack Brother   ? Heart disease Brother   ?     CABG  ? Leukemia Daughter   ?  ? ?Social History  ? ?Tobacco Use  ? Smoking status: Former  ?  Packs/day: 3.00  ?  Years: 23.00  ?  Pack years: 69.00  ?  Types: Cigarettes  ?  Quit date: 04/15/1978  ?  Years since quitting: 43.2  ? Smokeless tobacco: Never  ?Vaping Use  ? Vaping Use: Never used  ?Substance Use Topics  ? Alcohol use: No  ?  Alcohol/week: 0.0 standard drinks  ? Drug use: No  ? ? ?Allergies as of 06/26/2021 - Review Complete 06/24/2021  ?Allergen Reaction Noted  ? Scopolamine Anaphylaxis 05/01/2014  ? Amlodipine  05/01/2014  ? Amlodipine Swelling 11/22/2014  ? Benazepril Cough 11/22/2014  ? Codeine  05/01/2014  ? Crestor [rosuvastatin] Other (See Comments) 11/22/2014  ? Lovastatin Other (See Comments) 11/22/2014  ? Scopolamine  11/22/2014  ? Statins  05/01/2014  ? Zocor [simvastatin] Other (See Comments) 11/22/2014  ? ? ?Review of Systems:    ?All systems reviewed and negative except where noted in HPI. ? ? Physical Exam:  ?There were no  vitals taken for this visit. ?No LMP recorded. Patient has had a hysterectomy. ?General:   Alert,  Well-developed, well-nourished, pleasant and cooperative in NAD ?Head:  Normocephalic and atraumatic. ?Eyes:  Sclera clear, no icterus.   Conjunctiva pink. ?Ears:  Normal auditory acuity. ?Neck:  Supple; no masses or thyromegaly. ?Lungs:  Respirations even and unlabored.  Clear throughout to auscultation.   No wheezes, crackles, or rhonchi. No acute distress. ?Heart:  Regular rate and rhythm; no murmurs, clicks, rubs, or gallops. ?Abdomen:  Normal bowel sounds.  No bruits.  Soft, non-tender and non-distended without masses, hepatosplenomegaly or hernias noted.  No guarding or rebound tenderness.  Negative Carnett sign.   ?Rectal:  Deferred.  ?Pulses:  Normal pulses noted. ?Extremities:  No clubbing or edema.  No cyanosis. ?Neurologic:  Alert and oriented x3;  grossly normal neurologically. ?Skin:  Intact without significant lesions or rashes.  No jaundice. ?Lymph Nodes:  No significant cervical adenopathy. ?Psych:  Alert and cooperative. Normal mood and affect. ? ?Imaging Studies: ?No results found. ? ?Assessment and Plan:  ? ?SULEYMA WAFER is a 88 y.o. y/o female comes in today with diarrhea that is intermittent.  The patient also has GERD for which she takes Protonix.  The patient had an upper endoscopy by Dr. Vicente Males in 2017.  The patient has been reassured that since her symptoms are intermittent it is unlikely caused by a parasitic infection or inflammation.  The patient will have her stool sent off for pancreatic elastase and a GI panel.  The patient will be notified of the results when I receive them ? ? ? ?Lucilla Lame, MD. Marval Regal ? ? ? Note: This dictation was prepared with Dragon dictation along with smaller phrase technology. Any transcriptional errors that result from this process are unintentional.   ?

## 2021-06-27 ENCOUNTER — Ambulatory Visit: Payer: Medicare HMO | Admitting: Cardiovascular Disease

## 2021-06-27 ENCOUNTER — Other Ambulatory Visit: Payer: Self-pay | Admitting: Cardiovascular Disease

## 2021-06-27 ENCOUNTER — Encounter: Payer: Self-pay | Admitting: Cardiovascular Disease

## 2021-06-27 VITALS — BP 148/58 | HR 71 | Ht 65.5 in | Wt 164.0 lb

## 2021-06-27 DIAGNOSIS — I779 Disorder of arteries and arterioles, unspecified: Secondary | ICD-10-CM

## 2021-06-27 DIAGNOSIS — E785 Hyperlipidemia, unspecified: Secondary | ICD-10-CM

## 2021-06-27 DIAGNOSIS — I739 Peripheral vascular disease, unspecified: Secondary | ICD-10-CM

## 2021-06-27 DIAGNOSIS — I2510 Atherosclerotic heart disease of native coronary artery without angina pectoris: Secondary | ICD-10-CM | POA: Diagnosis not present

## 2021-06-27 NOTE — Patient Instructions (Signed)
Medication Instructions:  ?Your physician recommends that you continue on your current medications as directed. Please refer to the Current Medication list given to you today. ? ?*If you need a refill on your cardiac medications before your next appointment, please call your pharmacy* ? ? ?Lab Work: ?None ordered ?If you have labs (blood work) drawn today and your tests are completely normal, you will receive your results only by: ?MyChart Message (if you have MyChart) OR ?A paper copy in the mail ?If you have any lab test that is abnormal or we need to change your treatment, we will call you to review the results. ? ? ?Testing/Procedures: ?None ordered ? ? ?Follow-Up: ?At [facility], you and your health needs are our priority.  As part of our continuing mission to provide you with exceptional heart care, we have created designated Provider Care Teams.  These Care Teams include your primary Cardiologist (physician) and Advanced Practice Providers (APPs -  Physician Assistants and Nurse Practitioners) who all work together to provide you with the care you need, when you need it. ? ?We recommend signing up for the patient portal called "MyChart".  Sign up information is provided on this After Visit Summary.  MyChart is used to connect with patients for Virtual Visits (Telemedicine).  Patients are able to view lab/test results, encounter notes, upcoming appointments, etc.  Non-urgent messages can be sent to your provider as well.   ?To learn more about what you can do with MyChart, go to [email or web address].   ? ?Your next appointment:   ?As needed ? ?The format for your next appointment:   ?In Person ? ?Provider:   ?Kathlyn Sacramento, MD{ ? ? ? ?Other Instructions ?N/A ? ?

## 2021-06-27 NOTE — Progress Notes (Signed)
?  ?Cardiology Office Note ? ? ?Date:  06/27/2021  ? ?ID:  Deborah Gibson, DOB 05-13-1931, MRN [id number] ? ?PCP:  Charlynne Cousins, MD  ?Cardiologist:  Dr. Rockey Situ ? ?Chief Complaint  ?Patient presents with  ? Other  ?  6 month f/u pt d/c Repatha due to diarrhea. Meds reviewed verbally with pt.  ? ? ?  ?History of Present Illness: ?Deborah Gibson is a 88 y.o. female who is here today for follow-up visit regarding peripheral arterial disease.   ?She has known history of essential hypertension, previous tobacco use, nonobstructive coronary artery disease on previous cardiac catheterization 2016, carotid artery disease, GERD and hyperlipidemia. ?She had previous CTA of the lower extremities in 2019 which showed moderate bilateral SFA disease. ? ?She was seen last year for bilateral calf claudication that is worse on the left than the right.   ?Lower extremity arterial Doppler showed an ABI of 0.77 on the right and 0.66 on the left.  Duplex showed significant distal right SFA stenosis and significant left SFA/popliteal artery stenosis. ? ?I recommended starting with a walking exercise program and reserving angiography and endovascular intervention for refractory symptoms.   ? ?She had a syncopal episode that was thought to be vasovagal in the setting of orthostatic dizziness and mild dehydration.  Hydrochlorothiazide was switched to losartan.  She had an echocardiogram done in February which showed normal LV systolic function with mild to moderate mitral regurgitation.  Outpatient monitor showed normal sinus rhythm with short runs of SVT. ?Her blood pressure tends to fluctuate.  She now takes losartan as needed when blood pressure is elevated.  She does get dizzy when she moves her neck a certain way.  She reports minimal calf claudication and she seems to be more bothered by right hip pain and pain behind her left knee. ? ?Past Medical History:  ?Diagnosis Date  ? Arthritis   ? CAD (coronary artery disease)   ? a.  04/2014 Cath: EF >55%, diffuse minor irregularities.  ? Carotid arterial disease ([place])   ? a. Carotid U/S: RICA 17-51%, LICA 02-58%.  ? Diastolic dysfunction   ? a. 06/2017 Echo: EF 60-65%, mod LVH, Gr2DD, mild AI/MR. Mildly dil LA. Nl RV fxn. PASP 30-13mHg.  ? GERD (gastroesophageal reflux disease)   ? Hyperlipidemia   ? Hypertension   ? Orthostatic hypotension   ? a. 12/2020 w/ syncop-->HCTZ discontinued.  ? [place]) [place]   ? a. 12/2020 ABIs/Duplex: R 0.77 RCFA 30-49, RSFA 50-74, L 0.66, L Iliac 50-74, LCFA 30-49, LSFA 50-74.  ? Seasonal allergies   ? Skin cancer   ? Syncope   ? a. 12/2020 2/2 orthostasis.  ? ? ?Past Surgical History:  ?Procedure Laterality Date  ? BLADDER SUSPENSION    ? CARDIAC CATHETERIZATION    ? [place] ? CATARACT EXTRACTION    ? CORONARY ANGIOPLASTY  03/30/2014  ? CYSTOCELE REPAIR    ? ESOPHAGOGASTRODUODENOSCOPY (EGD) WITH PROPOFOL N/A 03/12/2016  ? Procedure: ESOPHAGOGASTRODUODENOSCOPY (EGD) WITH PROPOFOL;  Surgeon: KJonathon Bellows MD;  Location: [facility] ENDOSCOPY;  Service: Endoscopy;  Laterality: N/A;  ? EYE SURGERY    ? cataract surgery  ? rectal vaginal fistula repair    ? TONSILLECTOMY    ? TOTAL ABDOMINAL HYSTERECTOMY    ? VAGINAL HYSTERECTOMY    ? ? ? ?Current Outpatient Medications  ?Medication Sig Dispense Refill  ? albuterol (VENTOLIN HFA) 108 (90 Base) MCG/ACT inhaler SMARTSIG:2 Puff(s) By Mouth Every 4-6 Hours PRN    ?  aspirin EC 81 MG tablet Take 81 mg by mouth daily.    ? carvedilol (COREG) 25 MG tablet TAKE 1 TABLET (25 MG TOTAL) BY MOUTH 2 (TWO) TIMES DAILY WITH A MEAL. 180 tablet 4  ? cetirizine (ZYRTEC) 10 MG tablet Take 10 mg by mouth daily as needed.     ? COD LIVER OIL PO Take by mouth daily.     ? Coenzyme Q10 (COQ10) 200 MG CAPS Take 1 capsule by mouth daily.     ? COLLAGEN PO Take by mouth.    ? hydrALAZINE (APRESOLINE) 50 MG tablet Take 25 to 50 mg, up to three times a day as needed for blood pressures >180 180 tablet 3  ? losartan  (COZAAR) 25 MG tablet Take 1 tablet (25 mg total) by mouth daily. 90 tablet 1  ? NON FORMULARY daily. CBD oil and capsules    ? pantoprazole (PROTONIX) 40 MG tablet TAKE 1 TABLET BY MOUTH EVERY DAY 90 tablet 1  ? TURMERIC CURCUMIN PO Take by mouth daily.    ? VITAMIN A PO Take 300 mg by mouth daily.    ? vitamin B-12 (CYANOCOBALAMIN) 500 MCG tablet Take 500 mcg by mouth daily.    ? VITAMIN D, ERGOCALCIFEROL, PO Take by mouth.    ? ?No current facility-administered medications for this visit.  ? ? ?Allergies:   Scopolamine, Amlodipine, Amlodipine, Benazepril, Codeine, Crestor [rosuvastatin], Lovastatin, Scopolamine, Statins, and Zocor [simvastatin]  ? ? ?Social History:  The patient  reports that she quit smoking about 43 years ago. Her smoking use included cigarettes. She has a 69.00 pack-year smoking history. She has never used smokeless tobacco. She reports that she does not drink alcohol and does not use drugs.  ? ?Family History:  The patient's family history includes Cancer in her sister; Diabetes in her brother; Heart attack in her brother; Heart disease in her brother, brother, father, and sister; Heart failure in her mother; Hyperlipidemia in her mother; Hypertension in her brother, mother, and sister; Leukemia in her daughter; Stroke in her mother.  ? ? ?ROS:  Please see the history of present illness.   Otherwise, review of systems are positive for .   All other systems are reviewed and negative.  ? ? ?PHYSICAL EXAM: ?VS:  BP (!) 148/58 (BP Location: Left Arm, Patient Position: Sitting, Cuff Size: Normal)   Pulse 71   Ht 5' 5.5" (1.664 m)   Wt 164 lb (74.4 kg)   SpO2 98%   BMI 26.88 kg/m?  , BMI Body mass index is 26.88 kg/m?. ?GEN: Well nourished, well developed, in no acute distress  ?HEENT: normal  ?Neck: no JVD, carotid bruits, or masses ?Cardiac: RRR; no murmurs, rubs, or gallops,no edema  ?Respiratory:  clear to auscultation bilaterally, normal work of breathing ?GI: soft, nontender,  nondistended, + BS ?MS: no deformity or atrophy  ?Skin: warm and dry, no rash ?Neuro:  Strength and sensation are intact ?Psych: euthymic mood, full affect ?Vascular: Femoral pulses: +2 bilaterally.  Distal pulses are not palpable. ? ? ?EKG:  EKG is ordered today. ?EKG showed normal sinus rhythm without significant ST or T wave changes. ? ? ?Recent Labs: ?03/18/2021: TSH 2.590 ?06/17/2021: ALT 12; BUN 17; Creatinine, Ser 0.92; Hemoglobin 13.0; Platelets 248; Potassium 4.3; Sodium 139  ? ? ?Lipid Panel ?   ?Component Value Date/Time  ? CHOL 190 06/17/2021 0844  ? CHOL 229 (H) 09/14/2017 1019  ? TRIG 252 (H) 06/17/2021 0844  ? TRIG 379 (  H) 09/14/2017 1019  ? HDL 46 06/17/2021 0844  ? CHOLHDL 4.1 06/17/2021 0844  ? CHOLHDL 5.0 06/30/2017 0700  ? VLDL 76 (H) 09/14/2017 1019  ? [place] 101 (H) 06/17/2021 0844  ? ?  ? ?Wt Readings from Last 3 Encounters:  ?06/27/21 164 lb (74.4 kg)  ?06/26/21 163 lb (73.9 kg)  ?06/24/21 164 lb (74.4 kg)  ?  ? ? ? ? ? ?ASSESSMENT AND PLAN: ? ?1.  Peripheral arterial disease: She reports minimal bilateral calf claudication at that time and she seems to be more bothered by arthritis pain.  Recommend continuing medical therapy. ? ?2.  Carotid disease: Carotid Doppler in September showed 40 to 59% stenosis on the right and 60 to 79% on the left.  Recommend yearly carotid Doppler and refer to surgery of stenosis is greater than 80%.  She does report dizziness with certain neck movements and it is possible she might have some component carotid hypersensitivity. ? ?3.  Hyperlipidemia: She has intolerance to statins due to myalgia.  She reports that she had diarrhea with Repatha recently. ? ?4.  Nonobstructive coronary artery disease: Currently with no anginal symptoms. ? ? ? ?Disposition: Follow-up with me as needed if her claudication worsens.  Otherwise, continue to follow-up with Dr. Rockey Situ on a regular basis. ? ?Signed, ? ?Kathlyn Sacramento, MD  ?06/27/2021 9:11 AM    ?[place] ? ?

## 2021-07-01 DIAGNOSIS — R197 Diarrhea, unspecified: Secondary | ICD-10-CM | POA: Diagnosis not present

## 2021-07-03 ENCOUNTER — Ambulatory Visit
Admission: RE | Admit: 2021-07-03 | Discharge: 2021-07-03 | Disposition: A | Discharge status: Home / Self Care | Payer: Medicare HMO | Source: Ambulatory Visit | Attending: Internal Medicine | Admitting: Internal Medicine

## 2021-07-03 DIAGNOSIS — K3189 Other diseases of stomach and duodenum: Secondary | ICD-10-CM | POA: Diagnosis not present

## 2021-07-03 DIAGNOSIS — R1013 Epigastric pain: Secondary | ICD-10-CM | POA: Diagnosis not present

## 2021-07-03 DIAGNOSIS — K8689 Other specified diseases of pancreas: Secondary | ICD-10-CM | POA: Insufficient documentation

## 2021-07-03 DIAGNOSIS — K6389 Other specified diseases of intestine: Secondary | ICD-10-CM | POA: Diagnosis not present

## 2021-07-03 DIAGNOSIS — N281 Cyst of kidney, acquired: Secondary | ICD-10-CM | POA: Diagnosis not present

## 2021-07-03 DIAGNOSIS — K8020 Calculus of gallbladder without cholecystitis without obstruction: Secondary | ICD-10-CM | POA: Diagnosis not present

## 2021-07-03 MED ORDER — IOHEXOL 300 MG/ML  SOLN
100.0000 mL | Freq: Once | INTRAMUSCULAR | Status: AC | PRN
  Administered 2021-07-03: 100 mL via INTRAVENOUS

## 2021-07-03 NOTE — Addendum Note (Signed)
Addended by: Britt Bottom on: 07/03/2021 10:53 AM ? ? Modules accepted: Orders ? ?

## 2021-07-06 NOTE — Progress Notes (Signed)
Dr. Neomia Dear is out this week, but Dr. Wynetta Emery or I will get patient in to discuss results.  I saw you have been following this patient too and wanted to alert you to these results, as I see you also recently obtained labs.  After we have reviewed these results with her will will order the extra imaging.  Have a great day!!

## 2021-07-07 ENCOUNTER — Ambulatory Visit: Payer: Self-pay | Admitting: *Deleted

## 2021-07-07 ENCOUNTER — Telehealth: Payer: Self-pay | Admitting: Nurse Practitioner

## 2021-07-07 DIAGNOSIS — R1900 Intra-abdominal and pelvic swelling, mass and lump, unspecified site: Secondary | ICD-10-CM

## 2021-07-07 DIAGNOSIS — K869 Disease of pancreas, unspecified: Secondary | ICD-10-CM

## 2021-07-07 NOTE — Telephone Encounter (Signed)
Spoke to patient on phone to review recent imaging results with her -- concern for possible pancreas lesion + one lung nodule.  Discussed recommendations for further imaging with her, which she is agreeable to.  Answered all questions.  She stated appreciation for call.  Order for MR placed. ?

## 2021-07-07 NOTE — Progress Notes (Signed)
Placed MRI order and spoke to patient on phone 07/07/21 -- review telephone note.  Leave for her PCP to see results upon return.

## 2021-07-07 NOTE — Telephone Encounter (Signed)
Reviewed patients chart. Imaging was reviewed and discussed with patient by the provider this morning. See other phone encounter.  ?

## 2021-07-07 NOTE — Telephone Encounter (Signed)
Call received from [place] from [facility] Rad[facility] [phone] to report results of CT of abdomen  from 07/03/21 are ready for review in epic. Artist Pais, NP noted on 07/06/21 PCP out this week and will follow up with patient regarding results. Please advise if further assistance needed.  ?

## 2021-07-08 LAB — GI PROFILE, STOOL, PCR

## 2021-07-08 LAB — PANCREATIC ELASTASE, FECAL: Pancreatic Elastase, Fecal: 120 ug Elast./g — ABNORMAL LOW (ref >200)

## 2021-07-14 NOTE — Progress Notes (Signed)
Thnx much!

## 2021-07-14 NOTE — Progress Notes (Signed)
Can we fu with pt in 2-3 weeks pl thnx

## 2021-07-16 NOTE — Addendum Note (Signed)
Addended by: Lurlean Nanny on: 07/16/2021 09:02 AM ? ? Modules accepted: Orders ? ?

## 2021-07-18 MED ORDER — PANCRELIPASE (LIP-PROT-AMYL) 36000-114000 UNITS PO CPEP: ORAL_CAPSULE | ORAL | 11 refills | Status: AC

## 2021-07-18 NOTE — Addendum Note (Signed)
Addended by: Lurlean Nanny on: 07/18/2021 11:27 AM ? ? Modules accepted: Orders ? ?

## 2021-07-22 ENCOUNTER — Ambulatory Visit
Admission: RE | Admit: 2021-07-22 | Discharge: 2021-07-22 | Disposition: A | Discharge status: Home / Self Care | Payer: Medicare HMO | Source: Ambulatory Visit | Attending: Nurse Practitioner | Admitting: Nurse Practitioner

## 2021-07-22 DIAGNOSIS — R1900 Intra-abdominal and pelvic swelling, mass and lump, unspecified site: Secondary | ICD-10-CM | POA: Insufficient documentation

## 2021-07-22 DIAGNOSIS — R197 Diarrhea, unspecified: Secondary | ICD-10-CM | POA: Diagnosis not present

## 2021-07-22 DIAGNOSIS — N281 Cyst of kidney, acquired: Secondary | ICD-10-CM | POA: Diagnosis not present

## 2021-07-22 DIAGNOSIS — K869 Disease of pancreas, unspecified: Secondary | ICD-10-CM | POA: Diagnosis not present

## 2021-07-22 DIAGNOSIS — K8020 Calculus of gallbladder without cholecystitis without obstruction: Secondary | ICD-10-CM | POA: Diagnosis not present

## 2021-07-22 DIAGNOSIS — K8689 Other specified diseases of pancreas: Secondary | ICD-10-CM | POA: Diagnosis not present

## 2021-07-22 MED ORDER — GADOBUTROL 1 MMOL/ML IV SOLN
7.5000 mL | Freq: Once | INTRAVENOUS | Status: AC | PRN
  Administered 2021-07-22: 7.5 mL via INTRAVENOUS

## 2021-07-22 NOTE — Progress Notes (Signed)
Sees you in two days, will leave for you to discuss face to face and place oncology referral after discussion

## 2021-07-22 NOTE — Progress Notes (Signed)
Good afternoon, Dr. Neomia Dear while you were out this patient's CT returned showing possible pancreatic lesion with recommendation for MRI -- I ordered this.  MRI has unfortunately returned noting findings most consistent with pancreatic adenocarcinoma which may involve stomach as well.  I know she is being followed by GI and when I called her about CT I had made her aware of concerns.  I attempted to call her today with no answer, will try again this afternoon and place oncology referral.  Wanted you both to be aware.

## 2021-07-22 NOTE — Progress Notes (Signed)
Thanks so much. 

## 2021-07-24 ENCOUNTER — Ambulatory Visit (INDEPENDENT_AMBULATORY_CARE_PROVIDER_SITE_OTHER): Payer: Medicare HMO | Admitting: Internal Medicine

## 2021-07-24 ENCOUNTER — Telehealth: Payer: Self-pay | Admitting: Gastroenterology

## 2021-07-24 ENCOUNTER — Ambulatory Visit: Payer: Medicare HMO | Admitting: Internal Medicine

## 2021-07-24 VITALS — BP 170/72 | HR 66 | Temp 97.6°F | Ht 65.51 in | Wt 165.6 lb

## 2021-07-24 DIAGNOSIS — C259 Malignant neoplasm of pancreas, unspecified: Secondary | ICD-10-CM

## 2021-07-24 MED ORDER — TRAMADOL HCL 50 MG PO TABS: 50.0000 mg | ORAL_TABLET | Freq: Four times a day (QID) | ORAL | 0 refills | Status: AC | PRN

## 2021-07-24 NOTE — Telephone Encounter (Signed)
Patient has some medical questions and questions about a medication (CREON). Requesting a call back from Dr Dorothey Baseman nurse.  ?

## 2021-07-24 NOTE — Progress Notes (Signed)
? ?BP (!) 170/72   Pulse 66   Temp 97.6 ?F (36.4 ?C) (Oral)   Ht 5' 5.51" (1.664 m)   Wt 165 lb 9.6 oz (75.1 kg)   SpO2 97%   BMI 27.13 kg/m?   ? ?Subjective:  ? ? Patient ID: Deborah Gibson, female    DOB: May 21, 1931, 88 y.o.   MRN: [id number] ? ?Chief Complaint  ?Patient presents with  ?? liver mass  ? ? ?HPI: ?Deborah Gibson is a 88 y.o. female ? ?Abdominal Pain ?This is a recurrent (epigastric pain and LUQ pain with diarrhea was placed on creon for such sec to low elastase per Gi hasnt started htis yet. will need to start taking creon for persistnent diarrhea.) problem. The pain is located in the epigastric region and LUQ. The pain is at a severity of 4/10. The pain is mild. The quality of the pain is colicky. The abdominal pain radiates to the epigastric region. Associated symptoms include diarrhea. Pertinent negatives include no anorexia, arthralgias, belching, constipation, dysuria, fever, flatus, frequency, headaches, hematochezia, hematuria, melena, myalgias, nausea, vomiting or weight loss.  ? ?Chief Complaint  ?Patient presents with  ?? liver mass  ? ? ?Relevant past medical, surgical, family and social history reviewed and updated as indicated. Interim medical history since our last visit reviewed. ?Allergies and medications reviewed and updated. ? ?Review of Systems  ?Constitutional:  Negative for fever and weight loss.  ?Gastrointestinal:  Positive for abdominal pain and diarrhea. Negative for anorexia, constipation, flatus, hematochezia, melena, nausea and vomiting.  ?Genitourinary:  Negative for dysuria, frequency and hematuria.  ?Musculoskeletal:  Negative for arthralgias and myalgias.  ?Neurological:  Negative for headaches.  ? ?Per HPI unless specifically indicated above ? ?   ?Objective:  ?  ?BP (!) 170/72   Pulse 66   Temp 97.6 ?F (36.4 ?C) (Oral)   Ht 5' 5.51" (1.664 m)   Wt 165 lb 9.6 oz (75.1 kg)   SpO2 97%   BMI 27.13 kg/m?   ?Wt Readings from Last 3 Encounters:  ?07/24/21 165  lb 9.6 oz (75.1 kg)  ?06/27/21 164 lb (74.4 kg)  ?06/26/21 163 lb (73.9 kg)  ?  ?Physical Exam ?Vitals and nursing note reviewed.  ?Constitutional:   ?   General: She is not in acute distress. ?   Appearance: Normal appearance. She is not ill-appearing or diaphoretic.  ?Eyes:  ?   Conjunctiva/sclera: Conjunctivae normal.  ?Cardiovascular:  ?   Rate and Rhythm: Normal rate and regular rhythm.  ?   Heart sounds: No murmur heard. ?  No friction rub.  ?Pulmonary:  ?   Effort: No respiratory distress.  ?   Breath sounds: No stridor. No wheezing, rhonchi or rales.  ?Abdominal:  ?   General: Abdomen is flat. There is no distension.  ?   Palpations: There is no mass.  ?   Tenderness: There is abdominal tenderness. There is no right CVA tenderness or rebound.  ?   Hernia: No hernia is present.  ?Skin: ?   General: Skin is warm and dry.  ?   Coloration: Skin is not jaundiced.  ?   Findings: No erythema.  ?Neurological:  ?   Mental Status: She is alert.  ? ? ?Results for orders placed or performed in visit on 06/26/21  ?GI Profile, Stool, PCR  ?Result Value Ref Range  ? Campylobacter Not Detected Not Detected  ? C difficile toxin A/B Not Detected Not Detected  ? Plesiomonas shigelloides  Not Detected Not Detected  ? Salmonella Not Detected Not Detected  ? Vibrio Not Detected Not Detected  ? Vibrio cholerae Not Detected Not Detected  ? Yersinia enterocolitica Not Detected Not Detected  ? Enteroaggregative E coli Not Detected Not Detected  ? Enteropathogenic E coli Not Detected Not Detected  ? Enterotoxigenic E coli Not Detected Not Detected  ? Shiga-toxin-producing E coli Not Detected Not Detected  ? E coli S010 Not applicable Not Detected  ? Shigella/Enteroinvasive E coli Not Detected Not Detected  ? Cryptosporidium Not Detected Not Detected  ? Cyclospora cayetanensis Not Detected Not Detected  ? Entamoeba histolytica Not Detected Not Detected  ? Giardia lamblia Not Detected Not Detected  ? Adenovirus F 40/41 Not Detected Not  Detected  ? Astrovirus Not Detected Not Detected  ? Norovirus GI/GII Not Detected Not Detected  ? Rotavirus A Not Detected Not Detected  ? Sapovirus Not Detected Not Detected  ?Pancreatic elastase, fecal  ?Result Value Ref Range  ? Pancreatic Elastase, Fecal 120 (L) >200 ug Elast./g  ? ?   ? ? ?Current Outpatient Medications:  ??  albuterol (VENTOLIN HFA) 108 (90 Base) MCG/ACT inhaler, SMARTSIG:2 Puff(s) By Mouth Every 4-6 Hours PRN, Disp: , Rfl:  ??  aspirin EC 81 MG tablet, Take 81 mg by mouth daily., Disp: , Rfl:  ??  carvedilol (COREG) 25 MG tablet, TAKE 1 TABLET (25 MG TOTAL) BY MOUTH 2 (TWO) TIMES DAILY WITH A MEAL., Disp: 180 tablet, Rfl: 4 ??  cetirizine (ZYRTEC) 10 MG tablet, Take 10 mg by mouth daily as needed. , Disp: , Rfl:  ??  COD LIVER OIL PO, Take by mouth daily. , Disp: , Rfl:  ??  Coenzyme Q10 (COQ10) 200 MG CAPS, Take 1 capsule by mouth daily. , Disp: , Rfl:  ??  COLLAGEN PO, Take by mouth., Disp: , Rfl:  ??  hydrALAZINE (APRESOLINE) 50 MG tablet, Take 25 to 50 mg, up to three times a day as needed for blood pressures >180, Disp: 180 tablet, Rfl: 3 ??  lipase/protease/amylase (CREON) 36000 UNITS CPEP capsule, Take 2 capsules (72,000 Units total) by mouth 3 (three) times daily with meals. May also take 1 capsule (36,000 Units total) as needed (with snacks)., Disp: 240 capsule, Rfl: 11 ??  losartan (COZAAR) 25 MG tablet, Take 1 tablet (25 mg total) by mouth daily., Disp: 90 tablet, Rfl: 1 ??  NON FORMULARY, daily. CBD oil and capsules, Disp: , Rfl:  ??  pantoprazole (PROTONIX) 40 MG tablet, TAKE 1 TABLET BY MOUTH EVERY DAY, Disp: 90 tablet, Rfl: 1 ??  TURMERIC CURCUMIN PO, Take by mouth daily., Disp: , Rfl:  ??  VITAMIN A PO, Take 300 mg by mouth daily., Disp: , Rfl:  ??  vitamin B-12 (CYANOCOBALAMIN) 500 MCG tablet, Take 500 mcg by mouth daily., Disp: , Rfl:  ??  VITAMIN D, ERGOCALCIFEROL, PO, Take by mouth., Disp: , Rfl:   ? ?MRI abdomen :  ? Pancreatic tail mass is most consistent with  adenocarcinoma. ?Contacts and may involve the greater curvature of the stomach. ?2. No evidence of liver metastasis or abdominal adenopathy. ?3. Suspect splenic vein involvement with developing gastroepiploic ?collaterals. ?4. Cholelithiasis ?5. Pancreas divisum ?6. Left lower lobe pulmonary nodule, as on CT. ?7.  Aortic Atherosclerosis (ICD10-I70.0). ? ? ?Assessment & Plan:  ?Pancreatic adenocarcinoma:   ?Epigastric pain LUQ as well ?No Nausea ?Diarrhea + took 4 imodium for such  ?Was rx Creon for ? Diarrhea.  ? ?Problem List Items Addressed  This Visit   ? ?  ? Digestive  ? Pancreatic adenocarcinoma ([place]) - Primary  ? Relevant Orders  ? Ambulatory referral to Hematology / Oncology  ?  ? ?Orders Placed This Encounter  ?Procedures  ?? Ambulatory referral to Hematology / Oncology  ?  ? ?No orders of the defined types were placed in this encounter. ?  ? ?Follow up plan: ?No follow-ups on file. ? ? ?

## 2021-07-28 ENCOUNTER — Inpatient Hospital Stay: Payer: Medicare HMO | Attending: Oncology | Admitting: Oncology

## 2021-07-28 ENCOUNTER — Encounter: Payer: Self-pay | Admitting: Oncology

## 2021-07-28 ENCOUNTER — Inpatient Hospital Stay: Payer: Medicare HMO

## 2021-07-28 VITALS — BP 177/55 | HR 65 | Temp 97.0°F | Resp 18 | Wt 167.5 lb

## 2021-07-28 DIAGNOSIS — R1013 Epigastric pain: Secondary | ICD-10-CM

## 2021-07-28 DIAGNOSIS — C259 Malignant neoplasm of pancreas, unspecified: Secondary | ICD-10-CM

## 2021-07-28 DIAGNOSIS — Z87891 Personal history of nicotine dependence: Secondary | ICD-10-CM | POA: Insufficient documentation

## 2021-07-28 DIAGNOSIS — R911 Solitary pulmonary nodule: Secondary | ICD-10-CM

## 2021-07-28 DIAGNOSIS — Z79899 Other long term (current) drug therapy: Secondary | ICD-10-CM | POA: Diagnosis not present

## 2021-07-28 DIAGNOSIS — R978 Other abnormal tumor markers: Secondary | ICD-10-CM | POA: Insufficient documentation

## 2021-07-28 DIAGNOSIS — R634 Abnormal weight loss: Secondary | ICD-10-CM

## 2021-07-28 DIAGNOSIS — K8689 Other specified diseases of pancreas: Secondary | ICD-10-CM | POA: Diagnosis not present

## 2021-07-28 DIAGNOSIS — Z7982 Long term (current) use of aspirin: Secondary | ICD-10-CM | POA: Diagnosis not present

## 2021-07-28 DIAGNOSIS — Z7189 Other specified counseling: Secondary | ICD-10-CM

## 2021-07-28 LAB — CBC WITH DIFFERENTIAL/PLATELET
Abs Immature Granulocytes: 0.01 10*3/uL (ref 0.00–0.07)
Basophils Absolute: 0.1 10*3/uL (ref 0.0–0.1)
Basophils Relative: 1 %
Eosinophils Absolute: 0.3 10*3/uL (ref 0.0–0.5)
Eosinophils Relative: 4 %
HCT: 39.3 % (ref 36.0–46.0)
Hemoglobin: 12.9 g/dL (ref 12.0–15.0)
Immature Granulocytes: 0 %
Lymphocytes Relative: 36 %
Lymphs Abs: 2.5 10*3/uL (ref 0.7–4.0)
MCH: 28.5 pg (ref 26.0–34.0)
MCHC: 32.8 g/dL (ref 30.0–36.0)
MCV: 86.8 fL (ref 80.0–100.0)
Monocytes Absolute: 0.7 10*3/uL (ref 0.1–1.0)
Monocytes Relative: 10 %
Neutro Abs: 3.6 10*3/uL (ref 1.7–7.7)
Neutrophils Relative %: 49 %
Platelets: 188 10*3/uL (ref 150–400)
RBC: 4.53 MIL/uL (ref 3.87–5.11)
RDW: 13.5 % (ref 11.5–15.5)
WBC: 7.1 10*3/uL (ref 4.0–10.5)
nRBC: 0 % (ref 0.0–0.2)

## 2021-07-28 LAB — COMPREHENSIVE METABOLIC PANEL
ALT: 14 U/L (ref 0–44)
AST: 27 U/L (ref 15–41)
Albumin: 3.9 g/dL (ref 3.5–5.0)
Alkaline Phosphatase: 79 U/L (ref 38–126)
Anion gap: 7 (ref 5–15)
BUN: 17 mg/dL (ref 8–23)
CO2: 27 mmol/L (ref 22–32)
Calcium: 9 mg/dL (ref 8.9–10.3)
Chloride: 102 mmol/L (ref 98–111)
Creatinine, Ser: 0.72 mg/dL (ref 0.44–1.00)
GFR, Estimated: >60 mL/min (ref >60)
Glucose, Bld: 103 mg/dL — ABNORMAL HIGH (ref 70–99)
Potassium: 4.5 mmol/L (ref 3.5–5.1)
Sodium: 136 mmol/L (ref 135–145)
Total Bilirubin: 0.6 mg/dL (ref 0.3–1.2)
Total Protein: 7.7 g/dL (ref 6.5–8.1)

## 2021-07-28 LAB — LACTATE DEHYDROGENASE: LDH: 152 U/L (ref 98–192)

## 2021-07-28 NOTE — Progress Notes (Signed)
Patient here to establish care. Medications reviewed using patient's medication list.  ?

## 2021-07-28 NOTE — Progress Notes (Signed)
?Hematology/Oncology Consult note ?[phone] [phone] ?  ? ?   ? ? ?Patient Care Team: ?Charlynne Cousins, MD as PCP - General ?Minna Merritts, MD as PCP - Cardiology (Cardiology) ?[name], MD (Dermatology) ?Wellington Hampshire, MD as Consulting Physician (Cardiology) ?Clent Jacks, RN as Oncology Nurse Navigator ? ?REFERRING PROVIDER: ?Charlynne Cousins, MD  ?CHIEF COMPLAINTS/REASON FOR VISIT:  ?Evaluation of pancreatic mass in the follow-up with pain. ? ?HISTORY OF PRESENTING ILLNESS:  ? ?Deborah Gibson is a  88 y.o.  female with PMH listed below was seen in consultation at the request of  Vigg, Avanti, MD  for evaluation of pancreatic mass and abdominal pain. ? ?#Patient has experienced epigastric pain radiated to the back.  Diarrhea, decreased appetite and unintentional weight loss, 20 pounds over the past few weeks. ?06/26/2021, patient was seen by gastroenterology Dr. Allen Norris.  Pancreatic elastase was decreased at 120 patient was recommended to take Creon.  Patient has prescription as has not started yet. ? ?Patient was seen by primary care provider for ongoing abdominal discomfort. ?07/03/2021, CT abdomen with contrast showed 2.6 x 2.0 x 2.5 ill-defined hypoenhancing lesion in the tail of the pancreas, without main duct dilatation.  9 mm left lower lobe pulmonary nodule.  Cholelithiasis.  Aortic atherosclerosis. ?07/22/2021, abdomen MRI brain without contrast showed ?Pancreatic tail mass which is most consistent with malignancy.  May involve the greater curvature of the stomach.  No evidence of liver metastasis or abdominal adenopathy.  Suspect splenic vein involvement with developing gastroepiploic collaterals.  Cholelithiasis.  Pancreas divisum.  Left lower lobe pulmonary nodules on CT.  Aortic sclerosis. ? ?For her pain, patient has been prescribed tramadol 50 mg every 6 hours as needed.  She has not utilized tramadol very much due to not wanting to get addicted to.  She says overall  she has a very high pain tolerance. ? ?Patient is active for her age group, lives independently.  She was accompanied by grandson today. ? ?Review of Systems  ?Constitutional:  Positive for appetite change, fatigue and unexpected weight change. Negative for chills and fever.  ?HENT:   Negative for hearing loss and voice change.   ?Eyes:  Negative for eye problems.  ?Respiratory:  Negative for chest tightness and cough.   ?Cardiovascular:  Negative for chest pain.  ?Gastrointestinal:  Positive for abdominal pain. Negative for abdominal distention and blood in stool.  ?Endocrine: Negative for hot flashes.  ?Genitourinary:  Negative for difficulty urinating and frequency.   ?Musculoskeletal:  Negative for arthralgias.  ?Skin:  Negative for itching and rash.  ?Neurological:  Negative for extremity weakness.  ?Hematological:  Negative for adenopathy.  ?Psychiatric/Behavioral:  Negative for confusion.   ? ?MEDICAL HISTORY:  ?Past Medical History:  ?Diagnosis Date  ? Arthritis   ? CAD (coronary artery disease)   ? a. 04/2014 Cath: EF >55%, diffuse minor irregularities.  ? Carotid arterial disease ([place])   ? a. Carotid U/S: RICA 88-11%, LICA 03-15%.  ? Diastolic dysfunction   ? a. 06/2017 Echo: EF 60-65%, mod LVH, Gr2DD, mild AI/MR. Mildly dil LA. Nl RV fxn. PASP 30-71mHg.  ? GERD (gastroesophageal reflux disease)   ? Hyperlipidemia   ? Hypertension   ? Orthostatic hypotension   ? a. 12/2020 w/ syncop-->HCTZ discontinued.  ? [place]   ? a. 12/2020 ABIs/Duplex: R 0.77 RCFA 30-49, RSFA 50-74, L 0.66, L Iliac 50-74, LCFA 30-49, LSFA 50-74.  ? Seasonal allergies   ? Skin cancer   ?  Syncope   ? a. 12/2020 2/2 orthostasis.  ? ? ?SURGICAL HISTORY: ?Past Surgical History:  ?Procedure Laterality Date  ? BLADDER SUSPENSION    ? CARDIAC CATHETERIZATION    ? [place], [place]  ? CATARACT EXTRACTION    ? CORONARY ANGIOPLASTY  03/30/2014  ? CYSTOCELE REPAIR    ? ESOPHAGOGASTRODUODENOSCOPY (EGD) WITH  PROPOFOL N/A 03/12/2016  ? Procedure: ESOPHAGOGASTRODUODENOSCOPY (EGD) WITH PROPOFOL;  Surgeon: Jonathon Bellows, MD;  Location: [facility] ENDOSCOPY;  Service: Endoscopy;  Laterality: N/A;  ? EYE SURGERY    ? cataract surgery  ? rectal vaginal fistula repair    ? TONSILLECTOMY    ? TOTAL ABDOMINAL HYSTERECTOMY    ? VAGINAL HYSTERECTOMY    ? ? ?SOCIAL HISTORY: ?Social History  ? ?Socioeconomic History  ? Marital status: Widowed  ?  Spouse name: Not on file  ? Number of children: Not on file  ? Years of education: Not on file  ? Highest education level: Not on file  ?Occupational History  ? Not on file  ?Tobacco Use  ? Smoking status: Former  ?  Packs/day: 3.00  ?  Years: 2.00  ?  Pack years: 6.00  ?  Types: Cigarettes  ?  Quit date: 04/15/1978  ?  Years since quitting: 43.3  ? Smokeless tobacco: Never  ?Vaping Use  ? Vaping Use: Never used  ?Substance and Sexual Activity  ? Alcohol use: No  ?  Alcohol/week: 0.0 standard drinks  ? Drug use: No  ? Sexual activity: Not on file  ?Other Topics Concern  ? Not on file  ?Social History Narrative  ? ** Merged History Encounter **  ?    ? ?Social Determinants of Health  ? ?Financial Resource Strain: Low Risk   ? Difficulty of Paying Living Expenses: Not hard at all  ?Food Insecurity: Not on file  ?Transportation Needs: Not on file  ?Physical Activity: Not on file  ?Stress: No Stress Concern Present  ? Feeling of Stress : Not at all  ?Social Connections: Moderately Integrated  ? Frequency of Communication with Friends and Family: More than three times a week  ? Frequency of Social Gatherings with Friends and Family: More than three times a week  ? Attends Religious Services: More than 4 times per year  ? Active Member of Clubs or Organizations: Yes  ? Attends Archivist Meetings: More than 4 times per year  ? Marital Status: Widowed  ?Intimate Partner Violence: Not on file  ? ? ?FAMILY HISTORY: ?Family History  ?Problem Relation Age of Onset  ? Hypertension Mother   ? Heart  failure Mother   ? Hyperlipidemia Mother   ? Stroke Mother   ? Heart disease Father   ? Cancer Sister   ?     breast cancer  ? Heart disease Sister   ? Hypertension Sister   ? Breast cancer Sister   ? Diabetes Brother   ? Heart disease Brother   ?     pacer/defiib  ? Hypertension Brother   ? Heart attack Brother   ? Heart disease Brother   ?     CABG  ? Leukemia Daughter   ? ? ?ALLERGIES:  is allergic to scopolamine, amlodipine, amlodipine, benazepril, codeine, crestor [rosuvastatin], lovastatin, scopolamine, statins, and zocor [simvastatin]. ? ?MEDICATIONS:  ?Current Outpatient Medications  ?Medication Sig Dispense Refill  ? albuterol (VENTOLIN HFA) 108 (90 Base) MCG/ACT inhaler SMARTSIG:2 Puff(s) By Mouth Every 4-6 Hours PRN    ? aspirin EC 81  MG tablet Take 81 mg by mouth daily.    ? carvedilol (COREG) 25 MG tablet TAKE 1 TABLET (25 MG TOTAL) BY MOUTH 2 (TWO) TIMES DAILY WITH A MEAL. 180 tablet 4  ? cetirizine (ZYRTEC) 10 MG tablet Take 10 mg by mouth daily as needed.     ? COD LIVER OIL PO Take by mouth daily.     ? Coenzyme Q10 (COQ10) 200 MG CAPS Take 1 capsule by mouth daily.     ? COLLAGEN PO Take by mouth.    ? lipase/protease/amylase (CREON) 36000 UNITS CPEP capsule Take 2 capsules (72,000 Units total) by mouth 3 (three) times daily with meals. May also take 1 capsule (36,000 Units total) as needed (with snacks). 240 capsule 11  ? losartan (COZAAR) 25 MG tablet Take 1 tablet (25 mg total) by mouth daily. 90 tablet 1  ? pantoprazole (PROTONIX) 40 MG tablet TAKE 1 TABLET BY MOUTH EVERY DAY 90 tablet 1  ? traMADol (ULTRAM) 50 MG tablet Take 1 tablet (50 mg total) by mouth every 6 (six) hours as needed for up to 7 days. 28 tablet 0  ? TURMERIC CURCUMIN PO Take by mouth daily.    ? VITAMIN A PO Take 300 mg by mouth daily.    ? vitamin B-12 (CYANOCOBALAMIN) 500 MCG tablet Take 500 mcg by mouth daily.    ? VITAMIN D, ERGOCALCIFEROL, PO Take by mouth.    ? hydrALAZINE (APRESOLINE) 50 MG tablet Take 25 to 50 mg, up  to three times a day as needed for blood pressures >180 (Patient not taking: Reported on 07/28/2021) 180 tablet 3  ? NON FORMULARY daily. CBD oil and capsules (Patient not taking: Reported on 07/28/2021)    ? ?No

## 2021-07-29 LAB — CANCER ANTIGEN 19-9: CA 19-9: 47 U/mL — ABNORMAL HIGH (ref 0–35)

## 2021-08-01 NOTE — Telephone Encounter (Signed)
Left message on voicemail ? ?I called pt and she kept saying hello as if she could not hear me... I called back, it went to VM ?

## 2021-08-05 ENCOUNTER — Inpatient Hospital Stay (HOSPITAL_BASED_OUTPATIENT_CLINIC_OR_DEPARTMENT_OTHER): Payer: Medicare HMO | Admitting: Hospice and Palliative Medicine

## 2021-08-05 ENCOUNTER — Telehealth: Payer: Self-pay | Admitting: Primary Care

## 2021-08-05 ENCOUNTER — Other Ambulatory Visit: Payer: Self-pay

## 2021-08-05 ENCOUNTER — Ambulatory Visit: Payer: Medicare HMO | Admitting: Internal Medicine

## 2021-08-05 VITALS — BP 143/57 | HR 66 | Temp 98.4°F | Resp 16 | Wt 162.0 lb

## 2021-08-05 DIAGNOSIS — R978 Other abnormal tumor markers: Secondary | ICD-10-CM | POA: Diagnosis not present

## 2021-08-05 DIAGNOSIS — R1013 Epigastric pain: Secondary | ICD-10-CM | POA: Diagnosis not present

## 2021-08-05 DIAGNOSIS — Z79899 Other long term (current) drug therapy: Secondary | ICD-10-CM | POA: Diagnosis not present

## 2021-08-05 DIAGNOSIS — Z87891 Personal history of nicotine dependence: Secondary | ICD-10-CM | POA: Diagnosis not present

## 2021-08-05 DIAGNOSIS — R634 Abnormal weight loss: Secondary | ICD-10-CM | POA: Diagnosis not present

## 2021-08-05 DIAGNOSIS — R911 Solitary pulmonary nodule: Secondary | ICD-10-CM | POA: Diagnosis not present

## 2021-08-05 DIAGNOSIS — K8689 Other specified diseases of pancreas: Secondary | ICD-10-CM

## 2021-08-05 DIAGNOSIS — Z7982 Long term (current) use of aspirin: Secondary | ICD-10-CM | POA: Diagnosis not present

## 2021-08-05 NOTE — Research (Addendum)
?  This Nurse has reviewed this patient's inclusion and exclusion criteria as a second review and confirms Deborah Gibson is eligible for study participation.  Patient may continue with enrollment. ?Maxwell Marion, RN, BSN, [place] ?Clinical Research Nurse Lead ?08/07/2021 10:13 AM ? ? ? ?Trial: [employer] 2021-05 ? ?Patient Deborah Gibson was identified by Jeral Fruit, RN as a potential candidate for the above listed study.  This Clinical Research Nurse met with Deborah Gibson, [id number], on 08/05/21 in a manner and location that ensures patient privacy to discuss participation in the above listed research study.  Patient is Unaccompanied.  A copy of the informed consent document and separate HIPAA Authorization was provided to the patient.  Patient reads, speaks, and understands [place].   ? ?Patient was provided with the business card of this Nurse and encouraged to contact the research team with any questions.  Patient was provided the option of taking informed consent documents home to review and was encouraged to review at their convenience with their support network, including other care providers. Patient is comfortable with making a decision regarding study participation today. ? ?As outlined in the informed consent form, this Nurse and [name] discussed the purpose of the research study, the investigational nature of the study, study procedures and requirements for study participation, potential risks and benefits of study participation, as well as alternatives to participation. This study is not blinded. The patient understands participation is voluntary and they may withdraw from study participation at any time.  This study does not involve randomization.  This study does not involve an investigational drug or device. This study does not involve a placebo. Patient understands enrollment is pending full eligibility review.  ? ?Confidentiality and how the patient's information  will be used as part of study participation were discussed.  Patient was informed there is reimbursement provided for their time and effort spent on trial participation.  The patient is encouraged to discuss research study participation with their insurance provider to determine what costs they may incur as part of study participation, including research related injury.   ? ?All questions were answered to patient's satisfaction.  The informed consent and separate HIPAA Authorization was reviewed page by page.  The patient's mental and emotional status is appropriate to provide informed consent, and the patient verbalizes an understanding of study participation.  Patient has agreed to participate in the above listed research study and has voluntarily signed the informed consent version 14 and separate HIPAA Authorization, version 14  on 08/05/21 at 1036AM.  The patient was provided with a copy of the signed informed consent form and separate HIPAA Authorization for their reference.  No study specific procedures were obtained prior to the signing of the informed consent document.  Approximately 20 minutes were spent with the patient reviewing the informed consent documents.  After obtaining informed consent patient, voluntarily signed the optional Release of Information form for use throughout trial participation.  ?Jeral Fruit, RN ?08/05/21 ?10:44 AM ? ?

## 2021-08-05 NOTE — Telephone Encounter (Signed)
Spoke with patient and discussed the Palliative referral/services and all questions were answered and she was in agreement with scheduling visit.  I have scheduled a Telehealth Consult for 08/14/21 @ 2:30 PM ?

## 2021-08-05 NOTE — Progress Notes (Signed)
? ?  ?Palliative Medicine ?[place] [facility] ?Telephone:(336) [phone] Fax:(336) [phone] ? ? ?Name: Deborah Gibson ?Date: 08/05/2021 ?MRN: [id number]  ?DOB: 10/13/1931 ? ?Patient Care Team: ?Charlynne Cousins, MD as PCP - General ?Minna Merritts, MD as PCP - Cardiology (Cardiology) ?[name], MD (Dermatology) ?Wellington Hampshire, MD as Consulting Physician (Cardiology) ?Clent Jacks, RN as Oncology Nurse Navigator  ? ? ?REASON FOR CONSULTATION: ?Deborah Gibson is a 88 y.o. female with multiple medical problems including recently found pancreatic mass suspicious for pancreatic neoplasm.  Patient has met with medical oncology with discussions for further work-up and treatment.  Patient was undecided but leaning towards declining biopsy and systemic treatment.  She was referred to palliative care to address goals. ? ?SOCIAL HISTORY:    ? reports that she quit smoking about 43 years ago. Her smoking use included cigarettes. She has a 6.00 pack-year smoking history. She has never used smokeless tobacco. She reports that she does not drink alcohol and does not use drugs. ? ?Patient is widowed.  She lives at home alone.  She has a son who lives nearby.  She has another son in [place] and a daughter in [place]. ? ?ADVANCE DIRECTIVES:  ?Does not have ? ?CODE STATUS:  ? ?PAST MEDICAL HISTORY: ?Past Medical History:  ?Diagnosis Date  ? Arthritis   ? CAD (coronary artery disease)   ? a. 04/2014 Cath: EF >55%, diffuse minor irregularities.  ? Carotid arterial disease ([place])   ? a. Carotid U/S: RICA 09-32%, LICA 35-57%.  ? Diastolic dysfunction   ? a. 06/2017 Echo: EF 60-65%, mod LVH, Gr2DD, mild AI/MR. Mildly dil LA. Nl RV fxn. PASP 30-68mmHg.  ? GERD (gastroesophageal reflux disease)   ? Hyperlipidemia   ? Hypertension   ? Orthostatic hypotension   ? a. 12/2020 w/ syncop-->HCTZ discontinued.  ? PAD (peripheral artery disease) ([place])   ? a. 12/2020 ABIs/Duplex: R 0.77 RCFA 30-49, RSFA  50-74, L 0.66, L Iliac 50-74, LCFA 30-49, LSFA 50-74.  ? Seasonal allergies   ? Skin cancer   ? Syncope   ? a. 12/2020 2/2 orthostasis.  ? ? ?PAST SURGICAL HISTORY:  ?Past Surgical History:  ?Procedure Laterality Date  ? BLADDER SUSPENSION    ? CARDIAC CATHETERIZATION    ? [place], [place]  ? CATARACT EXTRACTION    ? CORONARY ANGIOPLASTY  03/30/2014  ? CYSTOCELE REPAIR    ? ESOPHAGOGASTRODUODENOSCOPY (EGD) WITH PROPOFOL N/A 03/12/2016  ? Procedure: ESOPHAGOGASTRODUODENOSCOPY (EGD) WITH PROPOFOL;  Surgeon: Jonathon Bellows, MD;  Location: [facility] ENDOSCOPY;  Service: Endoscopy;  Laterality: N/A;  ? EYE SURGERY    ? cataract surgery  ? rectal vaginal fistula repair    ? TONSILLECTOMY    ? TOTAL ABDOMINAL HYSTERECTOMY    ? VAGINAL HYSTERECTOMY    ? ? ?HEMATOLOGY/ONCOLOGY HISTORY:  ?Oncology History  ? No history exists.  ? ? ?ALLERGIES:  is allergic to scopolamine, amlodipine, amlodipine, benazepril, codeine, crestor [rosuvastatin], lovastatin, scopolamine, statins, and zocor [simvastatin]. ? ?MEDICATIONS:  ?Current Outpatient Medications  ?Medication Sig Dispense Refill  ? aspirin EC 81 MG tablet Take 81 mg by mouth daily.    ? carvedilol (COREG) 25 MG tablet TAKE 1 TABLET (25 MG TOTAL) BY MOUTH 2 (TWO) TIMES DAILY WITH A MEAL. 180 tablet 4  ? cetirizine (ZYRTEC) 10 MG tablet Take 10 mg by mouth daily as needed.     ? COD LIVER OIL PO Take by mouth daily.     ?  Coenzyme Q10 (COQ10) 200 MG CAPS Take 1 capsule by mouth daily.     ? COLLAGEN PO Take by mouth.    ? lipase/protease/amylase (CREON) 36000 UNITS CPEP capsule Take 2 capsules (72,000 Units total) by mouth 3 (three) times daily with meals. May also take 1 capsule (36,000 Units total) as needed (with snacks). 240 capsule 11  ? losartan (COZAAR) 25 MG tablet Take 1 tablet (25 mg total) by mouth daily. 90 tablet 1  ? pantoprazole (PROTONIX) 40 MG tablet TAKE 1 TABLET BY MOUTH EVERY DAY 90 tablet 1  ? TURMERIC CURCUMIN PO Take by mouth daily.    ? VITAMIN A PO Take 300  mg by mouth daily.    ? vitamin B-12 (CYANOCOBALAMIN) 500 MCG tablet Take 500 mcg by mouth daily.    ? VITAMIN D, ERGOCALCIFEROL, PO Take by mouth.    ? albuterol (VENTOLIN HFA) 108 (90 Base) MCG/ACT inhaler SMARTSIG:2 Puff(s) By Mouth Every 4-6 Hours PRN (Patient not taking: Reported on 08/05/2021)    ? hydrALAZINE (APRESOLINE) 50 MG tablet Take 25 to 50 mg, up to three times a day as needed for blood pressures >180 (Patient not taking: Reported on 07/28/2021) 180 tablet 3  ? NON FORMULARY daily. CBD oil and capsules (Patient not taking: Reported on 07/28/2021)    ? ?No current facility-administered medications for this visit.  ? ? ?VITAL SIGNS: ?BP (!) 143/57 (BP Location: Left Arm, Patient Position: Sitting)   Pulse 66   Temp 98.4 ?F (36.9 ?C) (Tympanic)   Resp 16   Wt 162 lb (73.5 kg)   SpO2 97%   BMI 26.54 kg/m?  ?Filed Weights  ? 08/05/21 0926  ?Weight: 162 lb (73.5 kg)  ?  ?Estimated body mass index is 26.54 kg/m? as calculated from the following: ?  Height as of 07/24/21: 5' 5.51" (1.664 m). ?  Weight as of this encounter: 162 lb (73.5 kg). ? ?LABS: ?CBC: ?   ?Component Value Date/Time  ? WBC 7.1 07/28/2021 1144  ? HGB 12.9 07/28/2021 1144  ? HGB 13.0 06/17/2021 0844  ? HCT 39.3 07/28/2021 1144  ? HCT 39.0 06/17/2021 0844  ? PLT 188 07/28/2021 1144  ? PLT 248 06/17/2021 0844  ? MCV 86.8 07/28/2021 1144  ? MCV 87 06/17/2021 0844  ? MCV 88 05/01/2014 1718  ? NEUTROABS 3.6 07/28/2021 1144  ? NEUTROABS 5.0 06/17/2021 0844  ? NEUTROABS 3.1 05/01/2014 1718  ? LYMPHSABS 2.5 07/28/2021 1144  ? LYMPHSABS 2.1 06/17/2021 0844  ? LYMPHSABS 3.3 05/01/2014 1718  ? MONOABS 0.7 07/28/2021 1144  ? MONOABS 0.6 05/01/2014 1718  ? EOSABS 0.3 07/28/2021 1144  ? EOSABS 0.5 (H) 06/17/2021 0844  ? EOSABS 0.3 05/01/2014 1718  ? BASOSABS 0.1 07/28/2021 1144  ? BASOSABS 0.1 06/17/2021 0844  ? BASOSABS 0.1 05/01/2014 1718  ? ?Comprehensive Metabolic Panel: ?   ?Component Value Date/Time  ? NA 136 07/28/2021 1144  ? NA 139 06/17/2021  0844  ? NA 142 05/01/2014 1718  ? K 4.5 07/28/2021 1144  ? K 4.0 05/01/2014 1718  ? CL 102 07/28/2021 1144  ? CL 104 05/01/2014 1718  ? CO2 27 07/28/2021 1144  ? CO2 31 05/01/2014 1718  ? BUN 17 07/28/2021 1144  ? BUN 17 06/17/2021 0844  ? BUN 16 05/01/2014 1718  ? CREATININE 0.72 07/28/2021 1144  ? CREATININE 0.87 05/01/2014 1718  ? GLUCOSE 103 (H) 07/28/2021 1144  ? GLUCOSE 96 05/01/2014 1718  ? CALCIUM 9.0 07/28/2021 1144  ?  CALCIUM 9.9 05/01/2014 1718  ? AST 27 07/28/2021 1144  ? AST 42 (H) 09/14/2017 1019  ? ALT 14 07/28/2021 1144  ? ALT 28 09/14/2017 1019  ? ALKPHOS 79 07/28/2021 1144  ? BILITOT 0.6 07/28/2021 1144  ? BILITOT 0.4 06/17/2021 0844  ? PROT 7.7 07/28/2021 1144  ? PROT 7.0 06/17/2021 0844  ? ALBUMIN 3.9 07/28/2021 1144  ? ALBUMIN 4.1 06/17/2021 0844  ? ? ?RADIOGRAPHIC STUDIES: ?MR Abdomen W Wo Contrast ? ?Result Date: 07/22/2021 ?CLINICAL DATA:  Abdominal pain and diarrhea. Pancreatic tail lesion on CT EXAM: MRI ABDOMEN WITHOUT AND WITH CONTRAST TECHNIQUE: Multiplanar multisequence MR imaging of the abdomen was performed both before and after the administration of intravenous contrast. CONTRAST:  7.44mL GADAVIST GADOBUTROL 1 MMOL/ML IV SOLN COMPARISON:  07/03/2021 CT FINDINGS: Lower chest: Normal heart size without pericardial or pleural effusion. The left lower lobe pulmonary nodule is again identified medially on 20/24, better characterized on CT. Hepatobiliary: Normal liver. 5 mm gallstone without acute cholecystitis or biliary duct dilatation. Pancreas: Pancreas divisum, with the dorsal duct entering the duodenum on 20/4. Corresponding to the CT abnormality, within the pancreatic tail, is a 3.0 x 2.7 by 2.7 cm mass on 15/4 and 17/3. Hypoenhancing, including on 30/20. Contacts and may involve the greater curvature of the stomach, including on 28/20 and 38/24. No surrounding pancreatitis. Spleen:  Normal in size, without focal abnormality. Adrenals/Urinary Tract: Normal adrenal glands. Lower pole  left renal 1.5 cm cyst on 56/20. Too small to characterize lesions in both kidneys are most likely cysts. No hydronephrosis. Stomach/Bowel: Otherwise normal appearance of the stomach. Normal abdominal bowel loops

## 2021-08-05 NOTE — Progress Notes (Signed)
Pt in for appointment with Palliative care provider for advance care planning.  ?

## 2021-08-06 NOTE — Telephone Encounter (Signed)
Left message on voicemail.

## 2021-08-07 ENCOUNTER — Other Ambulatory Visit: Payer: Self-pay

## 2021-08-07 ENCOUNTER — Inpatient Hospital Stay: Payer: Medicare HMO

## 2021-08-07 DIAGNOSIS — K8689 Other specified diseases of pancreas: Secondary | ICD-10-CM

## 2021-08-07 NOTE — Research (Signed)
[facility] Lab Visit:  ? ?Patient in to the cancer center for her scheduled lab appointment for the [facility] protocol, she was unaccompanied. [facility] patient history worksheet was completed by the patient while she was waiting for her lab visit without any difficulties. Assisted to the lab and blood drawn via venipuncture x 2 attempts, all vials were filled per protocol and rotated x 10. Blood was drawn by Lattie Haw, phlebotomist at 1021 am. Blood transported to microbiology for processing by Mauricio Po, [place] [facility] [place] and given to Marlou Porch with instructions from the protocol. Patient was provided with her gift card and instructions for use by Mauricio Po, [place]. Patient had no complaints following the procedure and was in good spirits. Research nurse escorted the patient back to the lobby for her departure, patient was thanked for her participation in our research program here at [facility].  ?Jeral Fruit, RN ?08/07/21 ?10:43 AM ? ?

## 2021-08-12 ENCOUNTER — Inpatient Hospital Stay: Payer: Medicare HMO

## 2021-08-12 ENCOUNTER — Inpatient Hospital Stay (HOSPITAL_BASED_OUTPATIENT_CLINIC_OR_DEPARTMENT_OTHER): Payer: Medicare HMO | Admitting: Licensed Clinical Social Worker

## 2021-08-12 DIAGNOSIS — Z85828 Personal history of other malignant neoplasm of skin: Secondary | ICD-10-CM | POA: Diagnosis not present

## 2021-08-12 DIAGNOSIS — Z803 Family history of malignant neoplasm of breast: Secondary | ICD-10-CM

## 2021-08-12 DIAGNOSIS — K8689 Other specified diseases of pancreas: Secondary | ICD-10-CM

## 2021-08-12 DIAGNOSIS — Z800 Family history of malignant neoplasm of digestive organs: Secondary | ICD-10-CM

## 2021-08-12 DIAGNOSIS — Z806 Family history of leukemia: Secondary | ICD-10-CM

## 2021-08-12 NOTE — Progress Notes (Signed)
REFERRING PROVIDER: ?Borders, Kirt Boys, NP ?[place],  [place] [address] ? ?PRIMARY PROVIDER:  ?Charlynne Cousins, MD ? ? ?HISTORY OF PRESENT ILLNESS:   ?Ms. Popescu, a 88 y.o. female, was seen for a [place] cancer genetics consultation at the request of Billey Chang, NP due to a personal and family history of cancer.  Ms. Rini presents to clinic today to discuss the possibility of a hereditary predisposition to cancer, genetic testing, and to further clarify her future cancer risks, as well as potential cancer risks for family members.  ? ?In 2023, at the age of 41, Ms. Nicholls was found to have a pancreatic mass suspicious for pancreatic neoplasm. She is leaning towards declining biopsy and systemic treatment.  ? ?RISK FACTORS:  ?Menarche was at age 24.  ?First live birth at age 52.5.  ?OCP use for approximately 0 years.  ?Ovaries intact:no. ?Hysterectomy: yes.  ?Menopausal status: postmenopausal.  ?HRT use: 0 years. ? ?Past Medical History:  ?Diagnosis Date  ? Arthritis   ? CAD (coronary artery disease)   ? a. 04/2014 Cath: EF >55%, diffuse minor irregularities.  ? Carotid arterial disease ([place])   ? a. Carotid U/S: RICA 96-29%, LICA 52-84%.  ? Diastolic dysfunction   ? a. 06/2017 Echo: EF 60-65%, mod LVH, Gr2DD, mild AI/MR. Mildly dil LA. Nl RV fxn. PASP 30-36mmHg.  ? GERD (gastroesophageal reflux disease)   ? Hyperlipidemia   ? Hypertension   ? Orthostatic hypotension   ? a. 12/2020 w/ syncop-->HCTZ discontinued.  ? PAD (peripheral artery disease) ([place])   ? a. 12/2020 ABIs/Duplex: R 0.77 RCFA 30-49, RSFA 50-74, L 0.66, L Iliac 50-74, LCFA 30-49, LSFA 50-74.  ? Seasonal allergies   ? Skin cancer   ? Syncope   ? a. 12/2020 2/2 orthostasis.  ? ? ?Past Surgical History:  ?Procedure Laterality Date  ? BLADDER SUSPENSION    ? CARDIAC CATHETERIZATION    ? [place], [place]  ? CATARACT EXTRACTION    ? CORONARY ANGIOPLASTY  03/30/2014  ? CYSTOCELE REPAIR    ? ESOPHAGOGASTRODUODENOSCOPY (EGD) WITH PROPOFOL  N/A 03/12/2016  ? Procedure: ESOPHAGOGASTRODUODENOSCOPY (EGD) WITH PROPOFOL;  Surgeon: Jonathon Bellows, MD;  Location: [facility] ENDOSCOPY;  Service: Endoscopy;  Laterality: N/A;  ? EYE SURGERY    ? cataract surgery  ? rectal vaginal fistula repair    ? TONSILLECTOMY    ? TOTAL ABDOMINAL HYSTERECTOMY    ? VAGINAL HYSTERECTOMY    ? ?  ? ?FAMILY HISTORY:  ?We obtained a detailed, 4-generation family history.  Significant diagnoses are listed below: ?Family History  ?Problem Relation Age of Onset  ? Hypertension Mother   ? Heart failure Mother   ? Hyperlipidemia Mother   ? Stroke Mother   ? Heart disease Father   ? Cancer Sister   ?     breast cancer  ? Heart disease Sister   ? Hypertension Sister   ? Breast cancer Sister   ? Diabetes Brother   ? Heart disease Brother   ?     pacer/defiib  ? Hypertension Brother   ? Heart attack Brother   ? Heart disease Brother   ?     CABG  ? Leukemia Daughter   ? ?Ms. Sieben had 2 daughters and 2 sons. One of her daughters had leukemia and passed at 9. Patient had 3 sisters and 3 brothers. One sister had breast cancer in her 68s-80s. One niece had leukemia, a nephew had bone cancer, and another niece  had an unknown type of cancer. ? ?Ms. Ng's mother died at 66. Patient had 1 maternal aunt, no cancers. Maternal grandmother may have had uterine cancer in her 3s. Grandfather passed at a young age of heart issues. ? ?Ms. Mirabal's father died at 39 of heart issues. Patient had 3 paternal aunts. One aunt had breast cancer, and her grandson had bone cancer. Paternal grandmother had breast cancer in her 8s-80s and passed from it. No information about grandfather.  ? ?Ms. Horvath is unaware of previous family history of genetic testing for hereditary cancer risks. There is no reported Ashkenazi Jewish ancestry. There is no known consanguinity. ? ?GENETIC COUNSELING ASSESSMENT: Ms. Willenbring is a 88 y.o. female with a personal history of likely pancreatic adenocarcinoma and family history of breast  cancer which is somewhat suggestive of a hereditary cancer syndrome and predisposition to cancer. We, therefore, discussed and recommended the following at today's visit.  ? ?DISCUSSION: We discussed that approximately 10% of pancreatic/breast cancer is hereditary. Most cases of hereditary pancreatic/breast cancer are associated with BRCA1/BRCA2 genes, although there are other genes associated with hereditary cancer as well. Cancers and risks are gene specific. We discussed that testing is beneficial for several reasons including knowing about cancer risks, identifying potential screening and risk-reduction options that may be appropriate, and to understand if other family members could be at risk for cancer and allow them to undergo genetic testing.  ? ?We reviewed the characteristics, features and inheritance patterns of hereditary cancer syndromes. We also discussed genetic testing, including the appropriate family members to test, the process of testing, insurance coverage and turn-around-time for results. We discussed the implications of a negative, positive and/or variant of uncertain significant result. We recommended Ms. Thayer pursue genetic testing for the [facility] CancerNext-Expanded+RNA gene panel.  ? ?Based on Ms. Mayberry's family history of cancer, she meets medical criteria for genetic testing. Despite that she meets criteria, she may still have an out of pocket cost. We discussed that if her out of pocket cost for testing is over $100, the laboratory will call and confirm whether she wants to proceed with testing.  If the out of pocket cost of testing is less than $100 she will be billed by the genetic testing laboratory.  ? ?PLAN: After considering the risks, benefits, and limitations, Ms. Clagg provided informed consent to pursue genetic testing and the blood sample was sent to [facility] for analysis of the CancerNext-Expanded+RNA panel. Results should be available within approximately 2-3  weeks' time, at which point they will be disclosed by telephone to Ms. Leap, as will any additional recommendations warranted by these results. Ms. Taber will receive a summary of her genetic counseling visit and a copy of her results once available. This information will also be available in Epic.  ? ?Ms. Napoli's questions were answered to her satisfaction today. Our contact information was provided should additional questions or concerns arise. Thank you for the referral and allowing [place] to share in the care of your patient.  ? ?Faith Rogue, MS, LCGC ?Genetic Counselor ?[email or web address] ?Phone: [phone] ? ?The patient was seen for a total of 30 minutes in face-to-face genetic counseling.  Dr. Grayland Ormond was available for discussion regarding this case.  ? ?_______________________________________________________________________ ?For Office Staff:  ?Number of people involved in session: 1 ?Was an Intern/ student involved with case: no ? ?

## 2021-08-13 ENCOUNTER — Inpatient Hospital Stay (HOSPITAL_BASED_OUTPATIENT_CLINIC_OR_DEPARTMENT_OTHER): Payer: Medicare HMO | Admitting: Hospice and Palliative Medicine

## 2021-08-13 DIAGNOSIS — K8689 Other specified diseases of pancreas: Secondary | ICD-10-CM

## 2021-08-13 NOTE — Progress Notes (Signed)
Multidisciplinary Oncology Council Documentation ? ?Deborah Gibson was presented by our [facility] on 08/13/2021, which included representatives from:  ?Palliative Care ?Dietitian  ?Physical/Occupational Therapist ?Nurse Navigator ?Genetics ?Speech Therapist ?Social work ?Survivorship RN ?Financial Navigator ?Research RN ? ? ?Deborah Gibson currently presents with history of pancreatic mass ? ?We reviewed previous medical and familial history, history of present illness, and recent lab results along with all available histopathologic and imaging studies. The [place] considered available treatment options and made the following recommendations/referrals: ? ?Palliative care ? ?The MOC is a meeting of clinicians from various specialty areas who evaluate and discuss patients for whom a multidisciplinary approach is being considered. Final determinations in the plan of care are those of the provider(s).  ? ?Today's extended care, comprehensive team conference, Deborah Gibson was not present for the discussion and was not examined.  ? ?

## 2021-08-14 ENCOUNTER — Other Ambulatory Visit: Payer: Self-pay | Admitting: Primary Care

## 2021-08-14 ENCOUNTER — Encounter: Payer: Self-pay | Admitting: Internal Medicine

## 2021-08-14 ENCOUNTER — Ambulatory Visit (INDEPENDENT_AMBULATORY_CARE_PROVIDER_SITE_OTHER): Payer: Medicare HMO | Admitting: Internal Medicine

## 2021-08-14 VITALS — BP 180/80 | HR 64 | Temp 97.9°F | Ht 65.51 in | Wt 163.6 lb

## 2021-08-14 DIAGNOSIS — C259 Malignant neoplasm of pancreas, unspecified: Secondary | ICD-10-CM | POA: Diagnosis not present

## 2021-08-14 DIAGNOSIS — I10 Essential (primary) hypertension: Secondary | ICD-10-CM

## 2021-08-14 DIAGNOSIS — E782 Mixed hyperlipidemia: Secondary | ICD-10-CM

## 2021-08-14 DIAGNOSIS — Z515 Encounter for palliative care: Secondary | ICD-10-CM

## 2021-08-14 DIAGNOSIS — Z7189 Other specified counseling: Secondary | ICD-10-CM

## 2021-08-14 NOTE — Progress Notes (Signed)
BP (!) 180/80   Pulse 64   Temp 97.9 F (36.6 C) (Oral)   Ht 5' 5.51" (1.664 m)   Wt 163 lb 9.6 oz (74.2 kg)   SpO2 96%   BMI 26.80 kg/m    Subjective:    Patient ID: Deborah Gibson, female    DOB: February 07, 1932, 88 y.o.   MRN: [id number]  Chief Complaint  Patient presents with  . Pancreatic Mass    Has seen Onc and GI    HPI: Deborah Gibson is a 88 y.o. female  Pt is here for a fu, is on palliative care now. She is here for on her new diagnosis of pancreatic cancer   Is travelling to [place] to be with her daughete to fill out 5 wishes with her. Has to make her new will. Pt would like to be DNR. Paperwork filled and advanced directives.    Hypertension This is a chronic problem. The current episode started more than 1 year ago. The problem has been waxing and waning since onset. The problem is uncontrolled. Pertinent negatives include no anxiety, blurred vision, chest pain, headaches, malaise/fatigue, neck pain, orthopnea, palpitations, peripheral edema, PND, shortness of breath or sweats.   Chief Complaint  Patient presents with  . Pancreatic Mass    Has seen Onc and GI    Relevant past medical, surgical, family and social history reviewed and updated as indicated. Interim medical history since our last visit reviewed. Allergies and medications reviewed and updated.  Review of Systems  Constitutional:  Negative for malaise/fatigue.  Eyes:  Negative for blurred vision.  Respiratory:  Negative for shortness of breath.   Cardiovascular:  Negative for chest pain, palpitations, orthopnea and PND.  Musculoskeletal:  Negative for neck pain.  Neurological:  Negative for headaches.   Per HPI unless specifically indicated above     Objective:    BP (!) 180/80   Pulse 64   Temp 97.9 F (36.6 C) (Oral)   Ht 5' 5.51" (1.664 m)   Wt 163 lb 9.6 oz (74.2 kg)   SpO2 96%   BMI 26.80 kg/m   Wt Readings from Last 3 Encounters:  08/14/21 163 lb 9.6 oz (74.2 kg)   08/05/21 162 lb (73.5 kg)  07/28/21 167 lb 8 oz (76 kg)    Physical Exam Vitals and nursing note reviewed.  Constitutional:      General: She is not in acute distress.    Appearance: Normal appearance. She is not ill-appearing or diaphoretic.  Eyes:     Conjunctiva/sclera: Conjunctivae normal.  Cardiovascular:     Rate and Rhythm: Normal rate and regular rhythm.     Heart sounds: No murmur heard.   No friction rub. No gallop.  Pulmonary:     Effort: No respiratory distress.     Breath sounds: No stridor. No rhonchi.  Abdominal:     General: Abdomen is flat. Bowel sounds are normal. There is no distension.     Palpations: Abdomen is soft. There is no mass.     Tenderness: There is no abdominal tenderness. There is no guarding.  Skin:    General: Skin is warm and dry.     Coloration: Skin is not jaundiced.     Findings: No erythema.  Neurological:     Mental Status: She is alert.     Cranial Nerves: No cranial nerve deficit.     Sensory: No sensory deficit.     Motor: No weakness.  Coordination: Coordination normal.  Psychiatric:        Mood and Affect: Mood normal.        Behavior: Behavior normal.    Results for orders placed or performed in visit on 07/28/21  Cancer antigen 19-9  Result Value Ref Range   CA 19-9 47 (H) 0 - 35 U/mL  Lactate dehydrogenase  Result Value Ref Range   LDH 152 98 - 192 U/L  Comprehensive metabolic panel  Result Value Ref Range   Sodium 136 135 - 145 mmol/L   Potassium 4.5 3.5 - 5.1 mmol/L   Chloride 102 98 - 111 mmol/L   CO2 27 22 - 32 mmol/L   Glucose, Bld 103 (H) 70 - 99 mg/dL   BUN 17 8 - 23 mg/dL   Creatinine, Ser 0.72 0.44 - 1.00 mg/dL   Calcium 9.0 8.9 - 10.3 mg/dL   Total Protein 7.7 6.5 - 8.1 g/dL   Albumin 3.9 3.5 - 5.0 g/dL   AST 27 15 - 41 U/L   ALT 14 0 - 44 U/L   Alkaline Phosphatase 79 38 - 126 U/L   Total Bilirubin 0.6 0.3 - 1.2 mg/dL   GFR, Estimated >60 >60 mL/min   Anion gap 7 5 - 15  CBC with  Differential/Platelet  Result Value Ref Range   WBC 7.1 4.0 - 10.5 K/uL   RBC 4.53 3.87 - 5.11 MIL/uL   Hemoglobin 12.9 12.0 - 15.0 g/dL   HCT 39.3 36.0 - 46.0 %   MCV 86.8 80.0 - 100.0 fL   MCH 28.5 26.0 - 34.0 pg   MCHC 32.8 30.0 - 36.0 g/dL   RDW 13.5 11.5 - 15.5 %   Platelets 188 150 - 400 K/uL   nRBC 0.0 0.0 - 0.2 %   Neutrophils Relative % 49 %   Neutro Abs 3.6 1.7 - 7.7 K/uL   Lymphocytes Relative 36 %   Lymphs Abs 2.5 0.7 - 4.0 K/uL   Monocytes Relative 10 %   Monocytes Absolute 0.7 0.1 - 1.0 K/uL   Eosinophils Relative 4 %   Eosinophils Absolute 0.3 0.0 - 0.5 K/uL   Basophils Relative 1 %   Basophils Absolute 0.1 0.0 - 0.1 K/uL   Immature Granulocytes 0 %   Abs Immature Granulocytes 0.01 0.00 - 0.07 K/uL        Current Outpatient Medications:  .  aspirin EC 81 MG tablet, Take 81 mg by mouth daily., Disp: , Rfl:  .  carvedilol (COREG) 25 MG tablet, TAKE 1 TABLET (25 MG TOTAL) BY MOUTH 2 (TWO) TIMES DAILY WITH A MEAL., Disp: 180 tablet, Rfl: 4 .  cetirizine (ZYRTEC) 10 MG tablet, Take 10 mg by mouth daily as needed. , Disp: , Rfl:  .  COD LIVER OIL PO, Take by mouth daily. , Disp: , Rfl:  .  Coenzyme Q10 (COQ10) 200 MG CAPS, Take 1 capsule by mouth daily. , Disp: , Rfl:  .  COLLAGEN PO, Take by mouth., Disp: , Rfl:  .  hydrALAZINE (APRESOLINE) 50 MG tablet, Take 25 to 50 mg, up to three times a day as needed for blood pressures >180, Disp: 180 tablet, Rfl: 3 .  lipase/protease/amylase (CREON) 36000 UNITS CPEP capsule, Take 2 capsules (72,000 Units total) by mouth 3 (three) times daily with meals. May also take 1 capsule (36,000 Units total) as needed (with snacks)., Disp: 240 capsule, Rfl: 11 .  losartan (COZAAR) 25 MG tablet, Take 1 tablet (25 mg total)  by mouth daily., Disp: 90 tablet, Rfl: 1 .  pantoprazole (PROTONIX) 40 MG tablet, TAKE 1 TABLET BY MOUTH EVERY DAY, Disp: 90 tablet, Rfl: 1 .  TURMERIC CURCUMIN PO, Take by mouth daily., Disp: , Rfl:  .  VITAMIN A PO,  Take 300 mg by mouth daily., Disp: , Rfl:  .  vitamin B-12 (CYANOCOBALAMIN) 500 MCG tablet, Take 500 mcg by mouth daily., Disp: , Rfl:  .  VITAMIN D, ERGOCALCIFEROL, PO, Take by mouth., Disp: , Rfl:  .  NON FORMULARY, daily. CBD oil and capsules (Patient not taking: Reported on 07/28/2021), Disp: , Rfl:     Assessment & Plan:  Pancreatic cancer :  Is on creon for chronic diarrhea.  Will need to fu with Gi for such Has pain meds that were given. Doesn't use it.   HTN :  Continue current meds.  Medication compliance emphasised. pt advised to keep Bp logs. Pt verbalised understanding of the same. Pt to have a low salt diet . Exercise to reach a goal of at least 150 mins a week.  lifestyle modifications explained and pt understands importance of the above. Under good control on current regimen. Continue current regimen. Continue to monitor. Call with any concerns. Refills given. Labs drawn today.    Problem List Items Addressed This Visit       Cardiovascular and Mediastinum   Essential hypertension     Digestive   Malignant neoplasm of pancreas ([facility])     Other   Hyperlipidemia - Primary     No orders of the defined types were placed in this encounter.    No orders of the defined types were placed in this encounter.    Follow up plan: Return in about 4 weeks (around 09/11/2021).

## 2021-08-14 NOTE — Progress Notes (Signed)
[facility] Consult Note Telephone: [phone]  Fax: [phone]   Date of encounter: 08/14/21 2:54 PM PATIENT NAME: Deborah Gibson [place] [address]   [phone] (home)  DOB: [birth year] MRN: [id number] PRIMARY CARE PROVIDER:    Charlynne Cousins, MD,  [place] [address] [phone]  REFERRING PROVIDER:   Billey Chang NP [place], NP [place],  [place] [id number]    RESPONSIBLE PARTY:    Contact Information     Name Relation Home Work Mobile   Yisel, Megill [place]   [phone]       Due to the COVID-19 crisis, this visit was done via telemedicine from my office and it was initiated and consent by this patient and or family.  I connected with  [name] OR PROXY on 08/14/21 by a telemedicine application and verified that I am speaking with the correct person using two identifiers.   I discussed the limitations of evaluation and management by telemedicine. The patient expressed understanding and agreed to proceed.   Palliative Care was asked to follow this patient by consultation request of  Borders, Kirt Boys, NP  to address advance care planning and complex medical decision making. This is the initial visit.                                     ASSESSMENT AND PLAN / RECOMMENDATIONS:   Advance Care Planning/Goals of Care: Goals include to maximize quality of life and symptom management. Patient/health care surrogate gave his/her permission to discuss.Our advance care planning conversation included a discussion about:    The value and importance of advance care planning  Exploration of personal, cultural or spiritual beliefs that might influence medical decisions  Exploration of goals of care in the event of a sudden injury or illness  Identification of a healthcare agent - son  CODE STATUS: full   Symptom  Management/Plan:  Patient patient met with me today by telephone application. She was not able to connect by video due to limitations in technology. Her son was also to have been on the call but he was not present. she states she found out she had pancreatic cancer through some diarrhea. She elected not to treat with chemo. She is currently taking Creon and is not having much in the way of distress. She states occasional pain but doesn't take narcotic for this. She does have tramadol which I've asked her to take with her on her trip in case she does experience pain.She is planning to go tomorrow by plane to [place] to visit family. She's not looking forward to flying that far but states otherwise she feels she's doing well. She does not know how long however she has due to her election not to treat her cancer with chemo. She states she would be happy to have a visit or phone call from me or the nurse when she returns from her trip. I will have our nurse touch base with her and put her on my schedule for approximately 6 weeks.   Follow up Palliative Care Visit: Palliative care will continue to follow for complex medical decision making, advance care planning, and clarification of goals. Return 4-6 weeks or prn.  I spent 30 minutes providing this consultation. More than 50% of the  time in this consultation was spent in counseling and care coordination.   PPS: 50%  HOSPICE ELIGIBILITY/DIAGNOSIS: TBD  Chief Complaint: pancreatic ca  HISTORY OF PRESENT ILLNESS:  Deborah Gibson is a 88 y.o. year old female  with pancreatic cancer . Patient seen today to review palliative care needs to include medical decision making and advance care planning as appropriate.   History obtained from review of EMR, discussion with primary team, and interview with family, facility staff/caregiver and/or [name].  I reviewed available labs, medications, imaging, studies and related documents from the EMR.  Records  reviewed and summarized above.   ROS  General: NAD ENMT: denies dysphagia Pulmonary: denies cough, denies increased SOB Abdomen: endorses good appetite, denies constipation,  diarrhea improved with creon, endorses continence of bowel GU: denies dysuria, endorses continence of urine MSK:  denies increased weakness,  no falls reported Skin: denies rashes or wounds Neurological: endorse transient abdominal  pain, denies insomnia Psych: Endorses positive mood  Physical Exam: deferred CURRENT PROBLEM LIST:  Patient Active Problem List   Diagnosis Date Noted   Malignant neoplasm of pancreas ([facility]) 08/14/2021   Goals of care, counseling/discussion 07/28/2021   Pancreatic insufficiency 07/28/2021   Pancreatic adenocarcinoma ([facility]) 07/24/2021   Screening for osteoporosis 06/24/2021   Encounter for screening mammogram for malignant neoplasm of breast 06/24/2021   Pancreatic stones 06/24/2021   Epigastric pain 06/24/2021   Statin intolerance 05/31/2019   Vertebral artery stenosis 06/30/2017   Osteoarthritis of right hip 03/03/2017   Advanced care planning/counseling discussion 02/17/2017   Calculus of gallbladder without cholecystitis without obstruction 02/24/2016   PVD (peripheral vascular disease) ([facility]) 01/29/2016   Hoarseness of voice 11/22/2014   Coronary artery disease 05/23/2014   Hyperlipidemia 05/01/2014   Family history of early CAD 05/01/2014   Essential hypertension 05/01/2014   Smoking history 05/01/2014   Esophageal dysmotility 05/01/2014   Gastroesophageal reflux disease without esophagitis 05/01/2014   PAST MEDICAL HISTORY:  Active Ambulatory Problems    Diagnosis Date Noted   Hyperlipidemia 05/01/2014   Family history of early CAD 05/01/2014   Essential hypertension 05/01/2014   Smoking history 05/01/2014   Esophageal dysmotility 05/01/2014   Gastroesophageal reflux disease without esophagitis 05/01/2014   Coronary artery disease 05/23/2014   Hoarseness of  voice 11/22/2014   PVD (peripheral vascular disease) ([facility]) 01/29/2016   Calculus of gallbladder without cholecystitis without obstruction 02/24/2016   Advanced care planning/counseling discussion 02/17/2017   Osteoarthritis of right hip 03/03/2017   Vertebral artery stenosis 06/30/2017   Statin intolerance 05/31/2019   Screening for osteoporosis 06/24/2021   Encounter for screening mammogram for malignant neoplasm of breast 06/24/2021   Pancreatic stones 06/24/2021   Epigastric pain 06/24/2021   Pancreatic adenocarcinoma ([facility]) 07/24/2021   Goals of care, counseling/discussion 07/28/2021   Pancreatic insufficiency 07/28/2021   Malignant neoplasm of pancreas ([facility]) 08/14/2021   Resolved Ambulatory Problems    Diagnosis Date Noted   Pain in the chest 05/01/2014   Chronic cough 05/01/2014   Leg pain 12/03/2015   Hypokalemia 03/05/2016   Urine discoloration 03/05/2016   Dizziness 06/30/2017   Past Medical History:  Diagnosis Date   Arthritis    CAD (coronary artery disease)    Carotid arterial disease ([facility])    Diastolic dysfunction    GERD (gastroesophageal reflux disease)    Hypertension    Orthostatic hypotension    PAD (peripheral artery disease) ([facility])    Seasonal allergies    Skin cancer    Syncope  SOCIAL HX:  Social History   Tobacco Use   Smoking status: Former    Packs/day: 3.00    Years: 2.00    Pack years: 6.00    Types: Cigarettes    Quit date: 04/15/1978    Years since quitting: 43.3   Smokeless tobacco: Never  Substance Use Topics   Alcohol use: No    Alcohol/week: 0.0 standard drinks   FAMILY HX:  Family History  Problem Relation Age of Onset   Hypertension Mother    Heart failure Mother    Hyperlipidemia Mother    Stroke Mother    Heart disease Father    Cancer Sister        breast cancer   Heart disease Sister    Hypertension Sister    Breast cancer Sister    Diabetes Brother    Heart disease Brother        pacer/defiib   Hypertension  Brother    Heart attack Brother    Heart disease Brother        CABG   Leukemia Daughter       ALLERGIES:  Allergies  Allergen Reactions   Scopolamine Anaphylaxis   Amlodipine    Amlodipine Swelling   Benazepril Cough   Codeine     Sick on stomach    Crestor [Rosuvastatin] Other (See Comments)    myalgias   Lovastatin Other (See Comments)    Myalgias    Scopolamine    Statins     Legs ache    Zocor [Simvastatin] Other (See Comments)    myalgias     PERTINENT MEDICATIONS:  Outpatient Encounter Medications as of 08/14/2021  Medication Sig   aspirin EC 81 MG tablet Take 81 mg by mouth daily.   carvedilol (COREG) 25 MG tablet TAKE 1 TABLET (25 MG TOTAL) BY MOUTH 2 (TWO) TIMES DAILY WITH A MEAL.   cetirizine (ZYRTEC) 10 MG tablet Take 10 mg by mouth daily as needed.    COD LIVER OIL PO Take by mouth daily.    Coenzyme Q10 (COQ10) 200 MG CAPS Take 1 capsule by mouth daily.    COLLAGEN PO Take by mouth.   hydrALAZINE (APRESOLINE) 50 MG tablet Take 25 to 50 mg, up to three times a day as needed for blood pressures >180   lipase/protease/amylase (CREON) 36000 UNITS CPEP capsule Take 2 capsules (72,000 Units total) by mouth 3 (three) times daily with meals. May also take 1 capsule (36,000 Units total) as needed (with snacks).   losartan (COZAAR) 25 MG tablet Take 1 tablet (25 mg total) by mouth daily.   NON FORMULARY daily. CBD oil and capsules (Patient not taking: Reported on 07/28/2021)   pantoprazole (PROTONIX) 40 MG tablet TAKE 1 TABLET BY MOUTH EVERY DAY   TURMERIC CURCUMIN PO Take by mouth daily.   VITAMIN A PO Take 300 mg by mouth daily.   vitamin B-12 (CYANOCOBALAMIN) 500 MCG tablet Take 500 mcg by mouth daily.   VITAMIN D, ERGOCALCIFEROL, PO Take by mouth.   No facility-administered encounter medications on file as of 08/14/2021.   Thank you for the opportunity to participate in the care of [name].  The palliative care team will continue to follow. Please call our  office at [phone] if we can be of additional assistance.   Jason Coop, NP , DNP, AGPCNP-BC  COVID-19 PATIENT SCREENING TOOL Asked and negative response unless otherwise noted:  Have you had symptoms of covid, tested positive or been in  contact with someone with symptoms/positive test in the past 5-10 days?

## 2021-08-21 ENCOUNTER — Other Ambulatory Visit: Payer: Self-pay | Admitting: Cardiovascular Disease

## 2021-09-01 ENCOUNTER — Ambulatory Visit: Payer: Medicare HMO | Admitting: Cardiovascular Disease

## 2021-09-03 ENCOUNTER — Encounter: Payer: Self-pay | Admitting: Licensed Clinical Social Worker

## 2021-09-03 ENCOUNTER — Telehealth: Payer: Self-pay | Admitting: Licensed Clinical Social Worker

## 2021-09-03 ENCOUNTER — Ambulatory Visit: Payer: Self-pay | Admitting: Licensed Clinical Social Worker

## 2021-09-03 DIAGNOSIS — Z1379 Encounter for other screening for genetic and chromosomal anomalies: Secondary | ICD-10-CM | POA: Insufficient documentation

## 2021-09-03 NOTE — Progress Notes (Signed)
HPI:  Deborah Gibson was previously seen in the [place] clinic due to a personal and family history of cancer and concerns regarding a hereditary predisposition to cancer. Please refer to our prior cancer genetics clinic note for more information regarding our discussion, assessment and recommendations, at the time. Deborah Gibson recent genetic test results were disclosed to her, as were recommendations warranted by these results. These results and recommendations are discussed in more detail below.  CANCER HISTORY:  Oncology History   No history exists.    FAMILY HISTORY:  We obtained a detailed, 4-generation family history.  Significant diagnoses are listed below: Family History  Problem Relation Age of Onset   Hypertension Mother    Heart failure Mother    Hyperlipidemia Mother    Stroke Mother    Heart disease Father    Cancer Sister        breast cancer   Heart disease Sister    Hypertension Sister    Breast cancer Sister    Diabetes Brother    Heart disease Brother        pacer/defiib   Hypertension Brother    Heart attack Brother    Heart disease Brother        CABG   Leukemia Daughter     Deborah Gibson had 2 daughters and 2 sons. One of her daughters had leukemia and passed at 58. Patient had 3 sisters and 3 brothers. One sister had breast cancer in her 32s-80s. One niece had leukemia, a nephew had bone cancer, and another niece had an unknown type of cancer.   Deborah Gibson mother died at 40. Patient had 1 maternal aunt, no cancers. Maternal grandmother may have had uterine cancer in her 59s. Grandfather passed at a young age of heart issues.   Deborah Gibson father died at 49 of heart issues. Patient had 3 paternal aunts. One aunt had breast cancer, and her grandson had bone cancer. Paternal grandmother had breast cancer in her 70s-80s and passed from it. No information about grandfather.    Deborah Gibson is unaware of previous family history of genetic testing for  hereditary cancer risks. There is no reported Ashkenazi Jewish ancestry. There is no known consanguinity.  GENETIC TEST RESULTS: Genetic testing reported out on 08/29/2021 through the [facility] CancerNext-Expanded+RNA cancer panel found no pathogenic mutations.   The CancerNext-Expanded + RNAinsight gene panel offered by [employer] and includes sequencing and rearrangement analysis for the following 77 genes: IP, ALK, APC*, ATM*, AXIN2, BAP1, BARD1, BLM, BMPR1A, BRCA1*, BRCA2*, BRIP1*, CDC73, CDH1*,CDK4, CDKN1B, CDKN2A, CHEK2*, CTNNA1, DICER1, FANCC, FH, FLCN, GALNT12, KIF1B, LZTR1, MAX, MEN1, MET, MLH1*, MSH2*, MSH3, MSH6*, MUTYH*, NBN, NF1*, NF2, NTHL1, PALB2*, PHOX2B, PMS2*, POT1, PRKAR1A, PTCH1, PTEN*, RAD51C*, RAD51D*,RB1, RECQL, RET, SDHA, SDHAF2, SDHB, SDHC, SDHD, SMAD4, SMARCA4, SMARCB1, SMARCE1, STK11, SUFU, TMEM127, TP53*,TSC1, TSC2, VHL and XRCC2 (sequencing and deletion/duplication); EGFR, EGLN1, HOXB13, KIT, MITF, PDGFRA, POLD1 and POLE (sequencing only); EPCAM and GREM1 (deletion/duplication only).   The test report has been scanned into EPIC and is located under the Molecular Pathology section of the Results Review tab.  A portion of the result report is included below for reference.     We discussed that because current genetic testing is not perfect, it is possible there may be a gene mutation in one of these genes that current testing cannot detect, but that chance is small.  There could be another gene that has not yet been discovered, or that we have not  yet tested, that is responsible for the cancer diagnoses in the family. It is also possible there is a hereditary cause for the cancer in the family that Deborah Gibson did not inherit and therefore was not identified in her testing.  Therefore, it is important to remain in touch with cancer genetics in the future so that we can continue to offer Deborah Gibson the most up to date genetic testing.   Genetic testing did identify a variant of  uncertain significance (VUS) in the PALB2 gene called c.3307G>C.  At this time, it is unknown if this variant is associated with increased cancer risk or if this is a normal finding, but most variants such as this get reclassified to being inconsequential. It should not be used to make medical management decisions. With time, we suspect the lab will determine the significance of this variant, if any. If we do learn more about it we will try to contact Deborah Gibson to discuss it further. However, it is important to stay in touch with [place] periodically and keep the address and phone number up to date.  ADDITIONAL GENETIC TESTING: We discussed with Deborah Gibson that her genetic testing was fairly extensive.  If there are genes identified to increase cancer risk that can be analyzed in the future, we would be happy to discuss and coordinate this testing at that time.    CANCER SCREENING RECOMMENDATIONS: Deborah Gibson test result is considered negative (normal).  This means that we have not identified a hereditary cause for her  personal and family history of cancer at this time. Most cancers happen by chance and this negative test suggests that her cancer may fall into this category.    While reassuring, this does not definitively rule out a hereditary predisposition to cancer. It is still possible that there could be genetic mutations that are undetectable by current technology. There could be genetic mutations in genes that have not been tested or identified to increase cancer risk.  Therefore, it is recommended she continue to follow the cancer management and screening guidelines provided by her oncology and primary healthcare provider.   An individual's cancer risk and medical management are not determined by genetic test results alone. Overall cancer risk assessment incorporates additional factors, including personal medical history, family history, and any available genetic information that may result in a  personalized plan for cancer prevention and surveillance.  RECOMMENDATIONS FOR FAMILY MEMBERS:  Relatives in this family might be at some increased risk of developing cancer, over the general population risk, simply due to the family history of cancer.  We recommended female relatives in this family have a yearly mammogram beginning at age 64, or 54 years younger than the earliest onset of cancer, an annual clinical breast exam, and perform monthly breast self-exams. Female relatives in this family should also have a gynecological exam as recommended by their primary provider.  All family members should be referred for colonoscopy starting at age 1.    It is also possible there is a hereditary cause for the cancer in Deborah Gibson's family that she did not inherit and therefore was not identified in her.  Based on Ms. Hanratty's family history, we recommended maternal relatives have genetic counseling and testing. Deborah Gibson will let us know if we can be of any assistance in coordinating genetic counseling and/or testing for these family members.  FOLLOW-UP: Lastly, we discussed with Ms. Duma that cancer genetics is a rapidly advancing field and it is possible that  new genetic tests will be appropriate for her and/or her family members in the future. We encouraged her to remain in contact with cancer genetics on an annual basis so we can update her personal and family histories and let her know of advances in cancer genetics that may benefit this family.   Our contact number was provided. Ms. Munford questions were answered to her satisfaction, and she knows she is welcome to call us at anytime with additional questions or concerns.   Faith Rogue, MS, [facility] Genetic Counselor [email or web address] Phone: [phone]

## 2021-09-03 NOTE — Telephone Encounter (Signed)
Revealed negative genetic testing.  Revealed that a VUS in PALB2 was identified. This normal result is reassuring and indicates that it is unlikely Deborah Gibson's cancer is due to a hereditary cause.  It is unlikely that there is an increased risk of another cancer due to a mutation in one of these genes.  However, genetic testing is not perfect, and cannot definitively rule out a hereditary cause.  It will be important for her to keep in contact with genetics to learn if any additional testing may be needed in the future.

## 2021-09-05 ENCOUNTER — Inpatient Hospital Stay: Payer: Medicare HMO | Attending: Oncology | Admitting: Hospice and Palliative Medicine

## 2021-09-05 DIAGNOSIS — K8689 Other specified diseases of pancreas: Secondary | ICD-10-CM

## 2021-09-05 NOTE — Progress Notes (Signed)
Unable to reach patient by phone.  Voicemail left.  Will reschedule. 

## 2021-09-24 ENCOUNTER — Telehealth: Payer: Self-pay | Admitting: Gastroenterology

## 2021-09-24 NOTE — Telephone Encounter (Signed)
Pt is aware that there is no alternative at this time for Creon that would be affordable for her during the "donut hole" insurance issue.Marland KitchenMarland KitchenThey are requesting pt to pay over $2000 for a 30 day supply... We do not have any samples available for pt at this time... Pt advised to stay hydrated and treat the Sx--diarrhea....  Pt assistance is for non-government based insurance

## 2021-09-24 NOTE — Telephone Encounter (Signed)
Patient called and stated she cannot afford the medication she is on. Would like to speak to Dr Allen Norris or his nurse about it.  * I transferred call to [facility].

## 2021-09-29 ENCOUNTER — Other Ambulatory Visit: Payer: Self-pay | Admitting: Primary Care

## 2021-10-01 ENCOUNTER — Other Ambulatory Visit: Payer: Self-pay | Admitting: Primary Care

## 2021-10-01 DIAGNOSIS — Z515 Encounter for palliative care: Secondary | ICD-10-CM

## 2021-10-01 DIAGNOSIS — C259 Malignant neoplasm of pancreas, unspecified: Secondary | ICD-10-CM | POA: Diagnosis not present

## 2021-10-01 DIAGNOSIS — K8689 Other specified diseases of pancreas: Secondary | ICD-10-CM | POA: Diagnosis not present

## 2021-10-01 NOTE — Progress Notes (Signed)
[employer] Palliative Care Consult Note Telephone: [phone]  Fax: [phone]    Date of encounter: 10/01/21 10:50 AM PATIENT NAME: Deborah Gibson Deborah Gibson [id number]   [phone] (home)  DOB: 1931-06-08 MRN: [id number] PRIMARY CARE PROVIDER:    Charlynne Cousins, MD (Inactive),  [place] [address] [phone]  REFERRING PROVIDER:   Irean Hong, NP [place],  [place] [address] [phone]   RESPONSIBLE PARTY:    Contact Information     Name Relation Home Work [place] Son Brilliant   [phone]      I met face to face with patient in her home. Palliative Care was asked to follow this patient by consultation request of  Borders, Kirt Boys, NP to address advance care planning and complex medical decision making. This is a follow up visit.                                   ASSESSMENT AND PLAN / RECOMMENDATIONS:   Advance Care Planning/Goals of Care: Goals include to maximize quality of life and symptom management. Patient/health care surrogate gave his/her permission to discuss.Our advance care planning conversation included a discussion about:    The value and importance of advance care planning  Experiences with loved ones who have been seriously ill or have died - has lost husband and a daughter in 30 Exploration of personal, cultural or spiritual beliefs that might influence medical decisions  Exploration of goals of care in the event of a sudden injury or illness  Identification of a healthcare agent - Helene Kelp, daughter  Review of an  advance directive document - her living will and the MOST form. Left Most form, she wants to discuss with daughter Daughter plans to move in asap to care for patient. Endorses does not want to be prolonged with poor quality of life but is very functional now. CODE STATUS: FULL code I spent 20  minutes providing this consultation. More than 50% of the time in this consultation was spent in counseling and care coordination. --------------------------------------------------------------------------------------------------------  Symptom Management/Plan:  Nutrition considerations of pancreatic cancer: States she is not taking creon currently due to cost.  Has [employer] but states co pay is $800/ month. Appetite is good, but has lost weight, now 6 more pounds. I have reached out to oncology team to see if they can provide an assistance program for creon.  Pain: Denies. Has had tramadol prescribed by pcp but she is not using. She did fill, PDMP consulted. Use WNL. Discussed symptoms which may arise with disease process.  Diarrhea/constipation: Denies generally but has small and odd colored but denies beige color. Endorses cholelithiasis and some epigastric discomfort. Controls with diet but appears to have constipation most days. 163 lbs on chart, pt reports 158 lbs. Has reported explosive diarrhea as well. Education provided RE constipation of pancreatic cancer.  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  Is able to move about in home safely  Follow up Palliative Care Visit: Palliative care will continue to follow for complex medical decision making, advance care planning, and clarification of goals. Return 6 weeks or prn.  This visit was coded based on medical decision making (MDM).  PPS: 60%  HOSPICE ELIGIBILITY/DIAGNOSIS: TBD  Chief Complaint: weight loss, bowel inconsistencies  HISTORY OF PRESENT ILLNESS:  Deborah Gibson is a 88 y.o. year old female  with diarrhea and constipation, anorexia, wt loss in context of pancreatic cancer. . Patient seen today to review palliative care needs to include medical decision making and advance care planning as appropriate.   History obtained from review of EMR, discussion with primary team, and interview with  family, facility staff/caregiver and/or [name].  I reviewed available labs, medications, imaging, studies and related documents from the EMR.  Records reviewed and summarized above.   ROS   General: NAD EYES: denies vision changes ENMT: denies dysphagia Cardiovascular: denies chest pain, denies DOE Pulmonary: denies cough, denies increased SOB Abdomen: endorses good appetite, denies constipation, endorses continence of bowel, endorses epigastric pain. GU: denies dysuria, endorses continence of urine MSK:  denies  increased weakness,  no falls reported Skin: denies rashes or wounds Neurological:  endorse occ belly pain, denies insomnia Psych: Endorses positive mood  Physical Exam: Current and past weights: 158 lbs  Constitutional: NAD General:  WNWD EYES: anicteric sclera, lids intact, no discharge  ENMT: intact hearing, oral mucous membranes moist, dentition intact CV: no LE edema Pulmonary:  no increased work of breathing, no cough, room air Abdomen: intake 70%,no ascites MSK: no sarcopenia, moves all extremities, ambulatory Skin: warm and dry, no rashes or wounds on visible skin Neuro:  no  new generalized weakness,  no cognitive impairment, non-anxious affect   Thank you for the opportunity to participate in the care of [name].  The palliative care team will continue to follow. Please call our office at [phone] if we can be of additional assistance.   Jason Coop, NP DNP, AGPCNP-BC  COVID-19 PATIENT SCREENING TOOL Asked and negative response unless otherwise noted:   Have you had symptoms of covid, tested positive or been in contact with someone with symptoms/positive test in the past 5-10 days?

## 2021-10-03 ENCOUNTER — Inpatient Hospital Stay: Payer: Medicare HMO | Admitting: Licensed Clinical Social Worker

## 2021-10-03 ENCOUNTER — Inpatient Hospital Stay: Payer: Medicare HMO | Attending: Oncology | Admitting: Hospice and Palliative Medicine

## 2021-10-03 DIAGNOSIS — C259 Malignant neoplasm of pancreas, unspecified: Secondary | ICD-10-CM

## 2021-10-03 NOTE — Progress Notes (Signed)
Deborah Gibson  Initial Assessment   Deborah Gibson is a 88 y.o. year old female contacted by phone. Clinical Social Gibson was referred by medical provider for assessment of psychosocial needs.   SDOH (Social Determinants of Health) assessments performed: Yes SDOH Interventions    Flowsheet Row Most Recent Value  SDOH Interventions   Food Insecurity Interventions Intervention Not Indicated  Financial Strain Interventions Financial Counselor  Housing Interventions Intervention Not Indicated  Stress Interventions Other (Comment)  [Stress related to medication cost]  Social Connections Interventions Intervention Not Indicated  Transportation Interventions Intervention Not Indicated       SDOH Screenings   Alcohol Screen: Low Risk  (06/04/2021)   Alcohol Screen    Last Alcohol Screening Score (AUDIT): 0  Depression (PHQ2-9): Low Risk  (10/03/2021)   Depression (PHQ2-9)    PHQ-2 Score: 0  Financial Resource Strain: Medium Risk (10/03/2021)   Overall Financial Resource Strain (CARDIA)    Difficulty of Paying Living Expenses: Somewhat hard  Food Insecurity: No Food Insecurity (10/03/2021)   Hunger Vital Sign    Worried About Running Out of Food in the Last Year: Never true    Ran Out of Food in the Last Year: Never true  Housing: Low Risk  (10/03/2021)   Housing    Last Housing Risk Score: 0  Physical Activity: Inactive (05/31/2020)   Exercise Vital Sign    Days of Exercise per Week: 0 days    Minutes of Exercise per Session: 0 min  Social Connections: Moderately Integrated (10/03/2021)   Social Connection and Isolation Panel [NHANES]    Frequency of Communication with Friends and Family: Three times a week    Frequency of Social Gatherings with Friends and Family: Three times a week    Attends Religious Services: More than 4 times per year    Active Member of Clubs or Organizations: Yes    Attends Archivist Meetings: 1 to 4 times per year    Marital Status: Widowed   Stress: Stress Concern Present (10/03/2021)   [place]    Feeling of Stress : To some extent  Tobacco Use: Medium Risk (08/14/2021)   Patient History    Smoking Tobacco Use: Former    Smokeless Tobacco Use: Never    Passive Exposure: Not on file  Transportation Needs: No Transportation Needs (10/03/2021)   PRAPARE - Transportation    Lack of Transportation (Medical): No    Lack of Transportation (Non-Medical): No     Distress Screen completed: No    07/28/2021   10:47 AM  ONCBCN DISTRESS SCREENING  Screening Type Initial Screening  Distress experienced in past week (1-10) 0      Family/Social Information:  Housing Arrangement: patient lives alonemain contacts are Deborah Gibson [phone] (son) and Deborah Gibson [phone] (grandson) Family members/support persons in your life? Family, Friends, Social worker, Education administrator, and Geophysical data processor concerns: no  Employment: Retired  .  Income source: Paediatric nurse concerns: Yes, current concerns Type of concern:  Medication cost Food access concerns: no Religious or spiritual practice: Yes Services Currently in place:  [employer], [employer]  Coping/ Adjustment to diagnosis: Patient understands treatment plan and what happens next? yes Concerns about diagnosis and/or treatment: How I will pay for the services I need Patient reported stressors: Adjusting to my illness and Medication cost Hopes and/or priorities: N/A Patient enjoys time with family/ friends Current coping skills/ strengths: Ability  for insight , Capable of independent living , Armed forces logistics/support/administrative officer , Scientist, research (life sciences) , General fund of knowledge , Motivation for treatment/growth , Physical Health , Religious Affiliation , Special hobby/interest , and Supportive family/friends     SUMMARY: Current SDOH Barriers:  Medication procurement  Clinical Social Gibson  Clinical Goal(s):  Patient will Gibson with Designer, jewellery, insurance company to address needs related to medication cost concerns  Interventions: Discussed common feeling and emotions when being diagnosed with cancer, and the importance of support during treatment Informed patient of the support team roles and support services at [facility] Provided CSW contact information and encouraged patient to call with any questions or concerns Provided patient with information about CSW role in patient care, available resources, and sent patient an email with medication/financial assistance information.   Follow Up Plan: Patient will contact CSW with any support or resource needs Patient verbalizes understanding of plan: Yes    Deborah Withem, LCSW

## 2021-10-03 NOTE — Progress Notes (Signed)
Virtual Visit via Telephone Note  I connected with Deborah Gibson on 10/03/21 at 10:30 AM EDT by telephone and verified that I am speaking with the correct person using two identifiers.  Location: Patient: Home Provider: Clinic   I discussed the limitations, risks, security and privacy concerns of performing an evaluation and management service by telephone and the availability of in person appointments. I also discussed with the patient that there may be a patient responsible charge related to this service. The patient expressed understanding and agreed to proceed.   History of Present Illness: Deborah Gibson is a 88 y.o. female with multiple medical problems including recently found pancreatic mass suspicious for pancreatic neoplasm.  Patient has met with medical oncology with discussions for further work-up and treatment.  Patient declined biopsy and systemic treatment.  She opted instead to focus on quality of life and comfort at home.     Observations/Objective: I called and spoke with patient by phone.  She reports that she is doing well.  She denies any significant changes or concerns.  No symptomatic complaints at present.  She reports good appetite and stable performance status.  She does say that she is out of her Creon and that the cost even with insurance is exorbitant.  We will send referral to social work to explore financial resources.  Appreciate community palliative care following her.  She will benefit from future hospice involvement.  Assessment and Plan: Pancreatic mass -best supportive care.  Community palliative care following at home.  Patient will likely benefit from future hospice involvement at the point of clinical decline.  Social work referral to explore financial resources  Follow Up Instructions:    I discussed the assessment and treatment plan with the patient. The patient was provided an opportunity to ask questions and all were answered. The patient  agreed with the plan and demonstrated an understanding of the instructions.   The patient was advised to call back or seek an in-person evaluation if the symptoms worsen or if the condition fails to improve as anticipated.  I provided 5 minutes of non-face-to-face time during this encounter.   Irean Hong, NP

## 2021-10-17 ENCOUNTER — Other Ambulatory Visit: Payer: Self-pay

## 2021-10-17 DIAGNOSIS — I10 Essential (primary) hypertension: Secondary | ICD-10-CM

## 2021-10-17 NOTE — Telephone Encounter (Signed)
LOV 08/14/21  No up coming appt. Noted at this time

## 2021-10-21 ENCOUNTER — Other Ambulatory Visit: Payer: Self-pay

## 2021-10-21 DIAGNOSIS — I10 Essential (primary) hypertension: Secondary | ICD-10-CM

## 2021-10-21 NOTE — Telephone Encounter (Signed)
Request for refill  LOV 08/14/21  No future appt noted

## 2021-11-04 ENCOUNTER — Other Ambulatory Visit: Payer: Self-pay | Admitting: Nurse Practitioner

## 2021-11-04 ENCOUNTER — Inpatient Hospital Stay: Payer: Medicare HMO | Attending: Oncology | Admitting: Hospice and Palliative Medicine

## 2021-11-04 DIAGNOSIS — C259 Malignant neoplasm of pancreas, unspecified: Secondary | ICD-10-CM

## 2021-11-04 DIAGNOSIS — Z515 Encounter for palliative care: Secondary | ICD-10-CM | POA: Diagnosis not present

## 2021-11-04 NOTE — Telephone Encounter (Signed)
Requested medications are due for refill today.  yes  Requested medications are on the active medications list.  yes  Last refill. 02/10/2021 #90 1 refill  Future visit scheduled.   no  Notes to clinic.  Dr. Neomia Dear pt.    Requested Prescriptions  Pending Prescriptions Disp Refills   pantoprazole (PROTONIX) 40 MG tablet [Pharmacy Med Name: PANTOPRAZOLE SOD DR 40 MG TAB] 90 tablet 1    Sig: TAKE 1 TABLET BY MOUTH EVERY DAY     Gastroenterology: Proton Pump Inhibitors Passed - 11/04/2021 11:54 AM      Passed - Valid encounter within last 12 months    Recent Outpatient Visits           2 months ago Mixed hyperlipidemia   [facility] Vigg, Avanti, MD   3 months ago Pancreatic adenocarcinoma ([place])   [facility] Vigg, Avanti, MD   4 months ago Mixed hyperlipidemia   [facility] Vigg, Avanti, MD   7 months ago PVD (peripheral vascular disease) ([place])   [facility] Vigg, Avanti, MD   10 months ago Need for influenza vaccination   [facility] Vigg, Avanti, MD       Future Appointments             In 2 weeks Gollan, Kathlene November, MD [facility]

## 2021-11-04 NOTE — Progress Notes (Signed)
Virtual Visit via Telephone Note  I connected with Deborah Gibson on 11/04/21 at  1:40 PM EDT by telephone and verified that I am speaking with the correct person using two identifiers.  Location: Patient: Home Provider: Clinic   I discussed the limitations, risks, security and privacy concerns of performing an evaluation and management service by telephone and the availability of in person appointments. I also discussed with the patient that there may be a patient responsible charge related to this service. The patient expressed understanding and agreed to proceed.   History of Present Illness: Deborah Gibson is a 88 y.o. female with multiple medical problems including recently found pancreatic mass suspicious for pancreatic neoplasm.  Patient has met with medical oncology with discussions for further work-up and treatment.  Patient declined biopsy and systemic treatment.  She opted instead to focus on quality of life and comfort at home.     Observations/Objective: I called and spoke with patient by phone.    Patient reports she is doing well.  She denies any significant changes or concerns.  She does report that she has had recurrence of loose stools since stopping the Creon.  Patient had self discontinued the Creon due to high cost.  She intends to restart the Creon despite the expense.  We will see if we can send an application for drug manufacturer assistance program.  Appreciate community palliative care following her.  She will benefit from future hospice involvement.  Assessment and Plan: Pancreatic mass -best supportive care.  Community palliative care following at home.  Patient will likely benefit from future hospice involvement at the point of clinical decline.    Follow Up Instructions: Follow-up telephone visit 1 to 2 months   I discussed the assessment and treatment plan with the patient. The patient was provided an opportunity to ask questions and all were answered.  The patient agreed with the plan and demonstrated an understanding of the instructions.   The patient was advised to call back or seek an in-person evaluation if the symptoms worsen or if the condition fails to improve as anticipated.  I provided 5 minutes of non-face-to-face time during this encounter.   Irean Hong, NP

## 2021-11-05 ENCOUNTER — Encounter: Payer: Self-pay | Admitting: *Deleted

## 2021-11-18 ENCOUNTER — Other Ambulatory Visit: Payer: Self-pay | Admitting: Primary Care

## 2021-11-18 DIAGNOSIS — C259 Malignant neoplasm of pancreas, unspecified: Secondary | ICD-10-CM

## 2021-11-18 DIAGNOSIS — R1013 Epigastric pain: Secondary | ICD-10-CM

## 2021-11-18 DIAGNOSIS — Z515 Encounter for palliative care: Secondary | ICD-10-CM

## 2021-11-18 NOTE — Progress Notes (Signed)
[facility] Consult Note Telephone: [phone]  Fax: [phone]    Date of encounter: 11/18/21 10:05 AM PATIENT NAME: Deborah Gibson [place] [address]   [phone] (home)  DOB: 06/12/1931 MRN: [id number] PRIMARY CARE PROVIDER:    Charlynne Cousins, MD,  [address] [place] [address] [phone]  REFERRING PROVIDER:   Charlynne Cousins, MD [address] [place],  [place] [address] [phone]  RESPONSIBLE PARTY:    Contact Information     Name Relation Home Work [place] Son [place]   [phone]       I met face to face with patient in  home.  Palliative Care was asked to follow this patient by consultation request of  Vigg, Avanti, MD to address advance care planning and complex medical decision making. This is a follow up visit.                                   ASSESSMENT AND PLAN / RECOMMENDATIONS:   Advance Care Planning/Goals of Care: Goals include to maximize quality of life and symptom management. Patient/health care surrogate gave his/her permission to discuss.Our advance care planning conversation included a discussion about:    The value and importance of advance care planning  Experiences with loved ones who have been seriously ill or have died - last member of her family Exploration of personal, cultural or spiritual beliefs that might influence medical decisions  Exploration of goals of care in the event of a sudden injury or illness  Identification of a healthcare agent - Daughter Review of an  advance directive document . Wants full measures now but will decline these once she is feeling worse. CODE STATUS Full at this time.  Symptom Management/Plan:  Creon:  Has document to pick up for grant at oncology. She will get tomorrow and complete. She is using less than recommended amount to make them last due to cost.  Mychart: Not  using currently, but needs help with set up.Will ask grandson.  Nausea/ diarrhea: denies, has had some constipation. Taking creon 1-3 tabs a day.  Occ left sided pain at hs.  Appetite: Good intake , not losing weight.  Follow up Palliative Care Visit: Palliative care will continue to follow for complex medical decision making, advance care planning, and clarification of goals. Return 8 weeks or prn.  I spent 60 minutes providing this consultation. More than 50% of the time in this consultation was spent in counseling and care coordination.  PPS: 50%  HOSPICE ELIGIBILITY/DIAGNOSIS: TBD  Chief Complaint: occ diarrhea  HISTORY OF PRESENT ILLNESS:  Deborah Gibson is a 88 y.o. year old female  with pancreatic mass assumed to be cancer, diarrhea, occ pain in L side. . Patient seen today to review palliative care needs to include medical decision making and advance care planning as appropriate.   History obtained from review of EMR, discussion with primary team, and interview with family, facility staff/caregiver and/or [name].  I reviewed available labs, medications, imaging, studies and related documents from the EMR.  Records reviewed and summarized above.   ROS   General: NAD ENMT: denies dysphagia Cardiovascular: denies chest pain, denies DOE Pulmonary: denies cough, denies increased SOB Abdomen: endorses good appetite, occ constipation, occ diarrhea, endorses continence of bowel GU: denies dysuria, endorses continence of urine MSK:  denies  increased weakness,  no falls reported Skin: denies rashes , R arm skin tear wounds Neurological: occ LUQ pain, denies insomnia Psych: Endorses positive mood  Physical Exam: Current and past weights: stable  Constitutional: NAD General: frail appearing, WNWD EYES: anicteric sclera, lids intact, no discharge  ENMT: intact hearing, oral mucous membranes moist, dentition intact CV:  no LE edema Pulmonary: no increased work of breathing,  no cough, room air Abdomen: intake 100%,  soft and non tender, no ascites MSK: no sarcopenia, moves all extremities, ambulatory Skin: warm and dry, no rashes noted, R  arm skin tear wounds  Neuro:  no generalized weakness,  no cognitive impairment, non-anxious affect   Thank you for the opportunity to participate in the care of [name].  The palliative care team will continue to follow. Please call our office at [phone] if we can be of additional assistance.   Jason Coop, NP DNP, AGPCNP-BC  COVID-19 PATIENT SCREENING TOOL Asked and negative response unless otherwise noted:   Have you had symptoms of covid, tested positive or been in contact with someone with symptoms/positive test in the past 5-10 days?
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
## 2021-11-19 ENCOUNTER — Encounter: Payer: Self-pay | Admitting: Cardiovascular Disease

## 2021-11-19 ENCOUNTER — Ambulatory Visit: Payer: Medicare HMO | Admitting: Cardiovascular Disease

## 2021-11-19 VITALS — BP 132/50 | HR 67 | Ht 65.5 in | Wt 161.0 lb

## 2021-11-19 DIAGNOSIS — I779 Disorder of arteries and arterioles, unspecified: Secondary | ICD-10-CM

## 2021-11-19 DIAGNOSIS — I739 Peripheral vascular disease, unspecified: Secondary | ICD-10-CM | POA: Diagnosis not present

## 2021-11-19 DIAGNOSIS — E782 Mixed hyperlipidemia: Secondary | ICD-10-CM

## 2021-11-19 DIAGNOSIS — I25118 Atherosclerotic heart disease of native coronary artery with other forms of angina pectoris: Secondary | ICD-10-CM

## 2021-11-19 DIAGNOSIS — I10 Essential (primary) hypertension: Secondary | ICD-10-CM

## 2021-11-19 DIAGNOSIS — R55 Syncope and collapse: Secondary | ICD-10-CM | POA: Diagnosis not present

## 2021-11-19 NOTE — Patient Instructions (Signed)
Medication Instructions:  No changes  If you need a refill on your cardiac medications before your next appointment, please call your pharmacy.   Lab work: No new labs needed  Testing/Procedures: No new testing needed  Follow-Up: At [facility], you and your health needs are our priority.  As part of our continuing mission to provide you with exceptional heart care, we have created designated Provider Care Teams.  These Care Teams include your primary Cardiologist (physician) and Advanced Practice Providers (APPs -  Physician Assistants and Nurse Practitioners) who all work together to provide you with the care you need, when you need it.  You will need a follow up appointment in 12 months  Providers on your designated Care Team:   Christopher Berge, NP Ryan Dunn, PA-C Cadence Furth, PA-C  COVID-19 Vaccine Information can be found at: [email or web address] For questions related to vaccine distribution or appointments, please email [email or web address] or call [phone].   

## 2021-12-02 ENCOUNTER — Other Ambulatory Visit: Payer: Self-pay | Admitting: Nurse Practitioner

## 2021-12-03 NOTE — Telephone Encounter (Signed)
Requested medications are due for refill today.  yes  Requested medications are on the active medications list.  yes  Last refill. 02/10/2021 #901 refill  Future visit scheduled.   no  Notes to clinic.  Dr. Neomia Dear pt.    Requested Prescriptions  Pending Prescriptions Disp Refills   pantoprazole (PROTONIX) 40 MG tablet [Pharmacy Med Name: PANTOPRAZOLE SOD DR 40 MG TAB] 90 tablet 1    Sig: TAKE 1 TABLET BY MOUTH EVERY DAY     Gastroenterology: Proton Pump Inhibitors Passed - 12/02/2021  2:26 PM      Passed - Valid encounter within last 12 months    Recent Outpatient Visits           3 months ago Mixed hyperlipidemia   [facility] Vigg, Avanti, MD   4 months ago Pancreatic adenocarcinoma ([place])   [facility] Vigg, Avanti, MD   5 months ago Mixed hyperlipidemia   [facility] Vigg, Avanti, MD   8 months ago PVD (peripheral vascular disease) ([place])   [facility] Vigg, Avanti, MD   11 months ago Need for influenza vaccination   [facility] Charlynne Cousins, MD

## 2021-12-08 ENCOUNTER — Telehealth: Payer: Medicare HMO | Admitting: Hospice and Palliative Medicine

## 2021-12-10 ENCOUNTER — Inpatient Hospital Stay: Payer: Medicare HMO | Attending: Oncology | Admitting: Hospice and Palliative Medicine

## 2021-12-10 DIAGNOSIS — C259 Malignant neoplasm of pancreas, unspecified: Secondary | ICD-10-CM | POA: Diagnosis not present

## 2021-12-10 NOTE — Progress Notes (Signed)
Virtual Visit via Telephone Note  I connected with Deborah Gibson on 12/10/21 at  1:40 PM EDT by telephone and verified that I am speaking with the correct person using two identifiers.  Location: Patient: Home Provider: Clinic   I discussed the limitations, risks, security and privacy concerns of performing an evaluation and management service by telephone and the availability of in person appointments. I also discussed with the patient that there may be a patient responsible charge related to this service. The patient expressed understanding and agreed to proceed.   History of Present Illness: Deborah Gibson is a 88 y.o. female with multiple medical problems including recently found pancreatic mass suspicious for pancreatic neoplasm.  Patient has met with medical oncology with discussions for further work-up and treatment.  Patient declined biopsy and systemic treatment.  She opted instead to focus on quality of life and comfort at home.     Observations/Objective: I called and spoke with patient by phone.    Patient reports she is doing reasonably well.  She denies significant changes or concerns.  She did restart Creon but had to pay out-of-pocket (in excess of $700) for this.  She has found improvement in diarrhea and GI symptoms after restarting the Creon.  She is in process of trying to complete the application for medication assistance but has to send them additional paperwork.  I encouraged her to take advantage of social work if needed for resources.  Appreciate community palliative care following her.  She will benefit from future hospice involvement.  Assessment and Plan: Pancreatic mass -best supportive care.  Community palliative care following at home.  Patient will likely benefit from future hospice involvement at the point of clinical decline.    Follow Up Instructions: Follow-up telephone visit 1 to 2 months   I discussed the assessment and treatment plan with the  patient. The patient was provided an opportunity to ask questions and all were answered. The patient agreed with the plan and demonstrated an understanding of the instructions.   The patient was advised to call back or seek an in-person evaluation if the symptoms worsen or if the condition fails to improve as anticipated.  I provided 5 minutes of non-face-to-face time during this encounter.   Irean Hong, NP

## 2022-01-05 ENCOUNTER — Telehealth: Payer: Self-pay | Admitting: Nurse Practitioner

## 2022-01-05 ENCOUNTER — Other Ambulatory Visit: Payer: Self-pay

## 2022-01-05 DIAGNOSIS — I10 Essential (primary) hypertension: Secondary | ICD-10-CM

## 2022-01-05 MED ORDER — CARVEDILOL 25 MG PO TABS: 25.0000 mg | ORAL_TABLET | Freq: Two times a day (BID) | ORAL | 0 refills | Status: DC

## 2022-01-05 NOTE — Telephone Encounter (Signed)
Medication refill for Carvedilol  last ov 08/14/21, upcoming ov no future appt noted . Please advise

## 2022-01-05 NOTE — Telephone Encounter (Signed)
Patient came into the office needing a refill on medication.  Refill sent for 90s.  Patient does need an appt to establish care with new provider.

## 2022-01-06 ENCOUNTER — Other Ambulatory Visit: Payer: Self-pay | Admitting: Primary Care

## 2022-01-06 DIAGNOSIS — R1013 Epigastric pain: Secondary | ICD-10-CM

## 2022-01-06 DIAGNOSIS — Z515 Encounter for palliative care: Secondary | ICD-10-CM

## 2022-01-06 DIAGNOSIS — K8689 Other specified diseases of pancreas: Secondary | ICD-10-CM

## 2022-01-06 DIAGNOSIS — C259 Malignant neoplasm of pancreas, unspecified: Secondary | ICD-10-CM

## 2022-01-06 NOTE — Progress Notes (Signed)
[facility] Consult Note Telephone: [phone]  Fax: [phone]    Date of encounter: 01/06/22 11:29 AM PATIENT NAME: SHAQUEENA MAUCERI [place] [address]   [phone] (home)  DOB: 1932/01/02 MRN: [id number] PRIMARY CARE PROVIDER:    Charlynne Cousins, MD (Inactive),  [place] [id number] [phone]  REFERRING PROVIDER:   Borders, Kirt Boys, NP Borders, Kirt Boys, NP [place],  [place] [address]   RESPONSIBLE PARTY:    Contact Information     Name Relation Home Work Mobile   [place] Son Jasper   [phone]       I met face to face with patient and family in  home. Palliative Care was asked to follow this patient by consultation request of  Borders, Kirt Boys, NP  to address advance care planning and complex medical decision making. This is a follow up visit.                                   ASSESSMENT AND PLAN / RECOMMENDATIONS:   Advance Care Planning/Goals of Care: Goals include to maximize quality of life and symptom management. Patient/health care surrogate gave his/her permission to discuss.Our advance care planning conversation included a discussion about:    The value and importance of advance care planning  Exploration of personal, cultural or spiritual beliefs that might influence medical decisions  Exploration of goals of care in the event of a sudden injury or illness  Identification of a healthcare agent - children Review of an advance directive document  Has still not completed,  asked to f/u with PCP [facility] Has no plans to treat cancer but wonders how long course will be Advised to f/u with cancer center [facility] care, to discuss and weigh options for any further diagnostics. CODE STATUS: FULL  Symptom Management/Plan:  Pancreatic function: Endorses some side pain, constipation . Has also gall stones with epigastric  pain. Has bed on elevated blocks to help with epigastric discomfort at hs.  Encouraged to obtain and use creon as she does have better effect when used. She does not use at prescribed amount due to costs. Cancer center may be able to find assistance program.  Mood: Endorses as good. Voices importance of faith in her illness.  Nutrition: Endorses eating well.  Endorses some weight loss. No fluid in LE.   Pain: endorses occ use of acetaminophen, has opioids, but does not plan to take at this point.  Continue with OTC analgesia.  PCP: has had lapse in PCP, will re establish next week at [facility].    Constipation: Occ, asso. with using less creon, again encouraged to use at total prescribed. May use OTC laxatives as well, titrating to effect.   Follow up Palliative Care Visit: Palliative care will continue to follow for complex medical decision making, advance care planning, and clarification of goals. Return per nurse visit program.    This visit was coded based on medical decision making (MDM).  PPS: 60%  HOSPICE ELIGIBILITY/DIAGNOSIS: TBD  Chief Complaint: occ LUQ pain, pancreatic cancer, constipation  HISTORY OF PRESENT ILLNESS:  FEMALE IAFRATE is a 88 y.o. year old female  with presumed pancreatic cancer, LUQ pain, constipation . Patient seen today to review palliative care needs to include medical decision making and advance care planning as appropriate.  History obtained from review of EMR, discussion with primary team, and interview with family, facility staff/caregiver and/or [name].  I reviewed available labs, medications, imaging, studies and related documents from the EMR.  Records reviewed and summarized above.   ROS   General: NAD ENMT: denies dysphagia Cardiovascular: denies chest pain, denies DOE Pulmonary: denies cough, denies increased SOB Abdomen: endorses good appetite, endorses , denies constipation, endorses continence of bowel GU: denies dysuria,  endorses continence of urine MSK:  denies  increased weakness,  no falls reported Skin: denies rashes or wounds Neurological: endorses occ LUQ pain, denies insomnia Psych: Endorses positive mood  Physical Exam: Current and past weights:reported stable, some loss but regain Constitutional: NAD General: frail appearing, WNWD EYES: anicteric sclera, lids intact, no discharge  ENMT: intact hearing, oral mucous membranes moist, dentition intact CV: no LE edema Pulmonary: no increased work of breathing, no cough, room air Abdomen: intake 80%,  no ascites MSK: no sarcopenia, moves all extremities, ambulatory Skin: warm and dry, no rashes or wounds on visible skin Neuro:  no increase in  generalized weakness,  no cognitive impairment, non-anxious affect   Thank you for the opportunity to participate in the care of [name].  The palliative care team will continue to follow. Please call our office at [phone] if we can be of additional assistance.   Jason Coop, NP DNP, AGPCNP-BC  COVID-19 PATIENT SCREENING TOOL Asked and negative response unless otherwise noted:   Have you had symptoms of covid, tested positive or been in contact with someone with symptoms/positive test in the past 5-10 days?

## 2022-01-12 ENCOUNTER — Ambulatory Visit (INDEPENDENT_AMBULATORY_CARE_PROVIDER_SITE_OTHER): Payer: Medicare HMO | Admitting: Nurse Practitioner

## 2022-01-12 ENCOUNTER — Encounter: Payer: Self-pay | Admitting: Nurse Practitioner

## 2022-01-12 VITALS — BP 160/80 | HR 67 | Temp 97.7°F | Wt 164.8 lb

## 2022-01-12 DIAGNOSIS — E782 Mixed hyperlipidemia: Secondary | ICD-10-CM

## 2022-01-12 DIAGNOSIS — I10 Essential (primary) hypertension: Secondary | ICD-10-CM

## 2022-01-12 DIAGNOSIS — Z23 Encounter for immunization: Secondary | ICD-10-CM | POA: Diagnosis not present

## 2022-01-12 DIAGNOSIS — C259 Malignant neoplasm of pancreas, unspecified: Secondary | ICD-10-CM | POA: Diagnosis not present

## 2022-01-12 MED ORDER — CARVEDILOL 25 MG PO TABS: 25.0000 mg | ORAL_TABLET | Freq: Two times a day (BID) | ORAL | 1 refills | Status: AC

## 2022-01-12 MED ORDER — LOSARTAN POTASSIUM 25 MG PO TABS: 25.0000 mg | ORAL_TABLET | Freq: Every day | ORAL | 1 refills | Status: AC

## 2022-01-12 MED ORDER — PANTOPRAZOLE SODIUM 40 MG PO TBEC: 40.0000 mg | DELAYED_RELEASE_TABLET | Freq: Every day | ORAL | 1 refills | Status: AC

## 2022-01-12 NOTE — Assessment & Plan Note (Signed)
Chronic.  Followed by Dr. Rockey Situ.  Continue with current medication regimen Carvedilol and Losartan.  Recommend checking blood pressures at home regularly.  Labs ordered today.  Return to clinic in 6 months for reevaluation.  Call sooner if concerns arise.

## 2022-01-12 NOTE — Assessment & Plan Note (Signed)
Chronic. Followed by Palliative and GI.  Continue to follow their recommendations.

## 2022-01-12 NOTE — Assessment & Plan Note (Signed)
Labs ordered at visit today.  Will make recommendations based on lab results.   

## 2022-01-12 NOTE — Progress Notes (Signed)
BP (!) 160/80   Pulse 67   Temp 97.7 F (36.5 C) (Oral)   Wt 164 lb 12.8 oz (74.8 kg)   SpO2 97%   BMI 27.01 kg/m    Subjective:    Patient ID: Deborah Gibson, female    DOB: 1931/08/20, 88 y.o.   MRN: [id number]  HPI: Deborah Gibson is a 88 y.o. female  Chief Complaint  Patient presents with   Hypertension   Hyperlipidemia   Diabetes    6 month follow up    Medication Refill        HYPERTENSION / HYPERLIPIDEMIA Satisfied with current treatment? yes Duration of hypertension: years BP monitoring frequency: not checking BP range:  BP medication side effects: no Past BP meds:  Hydralazine and losartan (cozaar), carvedilol (hydralazine only when she is running high- per Dr. Mariah Milling) Duration of hyperlipidemia: years Cholesterol medication side effects: no Cholesterol supplements: none Past cholesterol medications: none Medication compliance: excellent compliance Aspirin: no Recent stressors: no Recurrent headaches: no Visual changes: no Palpitations: no Dyspnea: no Chest pain: no Lower extremity edema: no Dizzy/lightheaded: no  PANCREATIC CANCER Patient is followed by Dr. Servando Snare and Palliative Care.    Relevant past medical, surgical, family and social history reviewed and updated as indicated. Interim medical history since our last visit reviewed. Allergies and medications reviewed and updated.  Review of Systems  Eyes:  Negative for visual disturbance.  Respiratory:  Negative for cough, chest tightness and shortness of breath.   Cardiovascular:  Negative for chest pain, palpitations and leg swelling.  Neurological:  Negative for dizziness and headaches.    Per HPI unless specifically indicated above     Objective:    BP (!) 160/80   Pulse 67   Temp 97.7 F (36.5 C) (Oral)   Wt 164 lb 12.8 oz (74.8 kg)   SpO2 97%   BMI 27.01 kg/m   Wt Readings from Last 3 Encounters:  01/12/22 164 lb 12.8 oz (74.8 kg)  11/19/21 161 lb (73 kg)  08/14/21 163  lb 9.6 oz (74.2 kg)    Physical Exam Vitals and nursing note reviewed.  Constitutional:      General: She is not in acute distress.    Appearance: Normal appearance. She is normal weight. She is not ill-appearing, toxic-appearing or diaphoretic.  HENT:     Head: Normocephalic.     Right Ear: External ear normal.     Left Ear: External ear normal.     Nose: Nose normal.     Mouth/Throat:     Mouth: Mucous membranes are moist.     Pharynx: Oropharynx is clear.  Eyes:     General:        Right eye: No discharge.        Left eye: No discharge.     Extraocular Movements: Extraocular movements intact.     Conjunctiva/sclera: Conjunctivae normal.     Pupils: Pupils are equal, round, and reactive to light.  Cardiovascular:     Rate and Rhythm: Normal rate and regular rhythm.     Heart sounds: No murmur heard. Pulmonary:     Effort: Pulmonary effort is normal. No respiratory distress.     Breath sounds: Normal breath sounds. No wheezing or rales.  Musculoskeletal:     Cervical back: Normal range of motion and neck supple.  Skin:    General: Skin is warm and dry.     Capillary Refill: Capillary refill takes less than 2 seconds.  Neurological:     General: No focal deficit present.     Mental Status: She is alert and oriented to person, place, and time. Mental status is at baseline.  Psychiatric:        Mood and Affect: Mood normal.        Behavior: Behavior normal.        Thought Content: Thought content normal.        Judgment: Judgment normal.     Results for orders placed or performed in visit on 07/28/21  Cancer antigen 19-9  Result Value Ref Range   CA 19-9 47 (H) 0 - 35 U/mL  Lactate dehydrogenase  Result Value Ref Range   LDH 152 98 - 192 U/L  Comprehensive metabolic panel  Result Value Ref Range   Sodium 136 135 - 145 mmol/L   Potassium 4.5 3.5 - 5.1 mmol/L   Chloride 102 98 - 111 mmol/L   CO2 27 22 - 32 mmol/L   Glucose, Bld 103 (H) 70 - 99 mg/dL   BUN 17 8 -  23 mg/dL   Creatinine, Ser 0.72 0.44 - 1.00 mg/dL   Calcium 9.0 8.9 - 10.3 mg/dL   Total Protein 7.7 6.5 - 8.1 g/dL   Albumin 3.9 3.5 - 5.0 g/dL   AST 27 15 - 41 U/L   ALT 14 0 - 44 U/L   Alkaline Phosphatase 79 38 - 126 U/L   Total Bilirubin 0.6 0.3 - 1.2 mg/dL   GFR, Estimated >60 >60 mL/min   Anion gap 7 5 - 15  CBC with Differential/Platelet  Result Value Ref Range   WBC 7.1 4.0 - 10.5 K/uL   RBC 4.53 3.87 - 5.11 MIL/uL   Hemoglobin 12.9 12.0 - 15.0 g/dL   HCT 39.3 36.0 - 46.0 %   MCV 86.8 80.0 - 100.0 fL   MCH 28.5 26.0 - 34.0 pg   MCHC 32.8 30.0 - 36.0 g/dL   RDW 13.5 11.5 - 15.5 %   Platelets 188 150 - 400 K/uL   nRBC 0.0 0.0 - 0.2 %   Neutrophils Relative % 49 %   Neutro Abs 3.6 1.7 - 7.7 K/uL   Lymphocytes Relative 36 %   Lymphs Abs 2.5 0.7 - 4.0 K/uL   Monocytes Relative 10 %   Monocytes Absolute 0.7 0.1 - 1.0 K/uL   Eosinophils Relative 4 %   Eosinophils Absolute 0.3 0.0 - 0.5 K/uL   Basophils Relative 1 %   Basophils Absolute 0.1 0.0 - 0.1 K/uL   Immature Granulocytes 0 %   Abs Immature Granulocytes 0.01 0.00 - 0.07 K/uL      Assessment & Plan:   Problem List Items Addressed This Visit       Cardiovascular and Mediastinum   Essential hypertension    Chronic.  Followed by Dr. Rockey Situ.  Continue with current medication regimen Carvedilol and Losartan.  Recommend checking blood pressures at home regularly.  Labs ordered today.  Return to clinic in 6 months for reevaluation.  Call sooner if concerns arise.        Relevant Medications   carvedilol (COREG) 25 MG tablet   losartan (COZAAR) 25 MG tablet     Digestive   Pancreatic adenocarcinoma (HCC) - Primary    Chronic. Followed by Palliative and GI.  Continue to follow their recommendations.       Relevant Orders   Comp Met (CMET)   Malignant neoplasm of pancreas (HCC)    Chronic. Followed by  Palliative and GI.  Continue to follow their recommendations.       Relevant Orders   Comp Met (CMET)    CBC w/Diff     Other   Hyperlipidemia    Labs ordered at visit today.  Will make recommendations based on lab results.       Relevant Medications   carvedilol (COREG) 25 MG tablet   losartan (COZAAR) 25 MG tablet   Other Relevant Orders   Lipid Profile   Other Visit Diagnoses     Need for pneumococcal 20-valent conjugate vaccination       Relevant Orders   Pneumococcal conjugate vaccine 20-valent (Prevnar 20) (Completed)        Follow up plan: Return in about 6 months (around 07/14/2022) for HTN, HLD, DM2 FU.

## 2022-01-13 LAB — CBC WITH DIFFERENTIAL/PLATELET
Basophils Absolute: 0 10*3/uL (ref 0.0–0.2)
Basos: 1 %
EOS (ABSOLUTE): 0.2 10*3/uL (ref 0.0–0.4)
Eos: 3 %
Hematocrit: 37.6 % (ref 34.0–46.6)
Hemoglobin: 12.5 g/dL (ref 11.1–15.9)
Immature Grans (Abs): 0 10*3/uL (ref 0.0–0.1)
Immature Granulocytes: 0 %
Lymphocytes Absolute: 2.7 10*3/uL (ref 0.7–3.1)
Lymphs: 36 %
MCH: 29 pg (ref 26.6–33.0)
MCHC: 33.2 g/dL (ref 31.5–35.7)
MCV: 87 fL (ref 79–97)
Monocytes Absolute: 0.7 10*3/uL (ref 0.1–0.9)
Monocytes: 9 %
Neutrophils Absolute: 3.9 10*3/uL (ref 1.4–7.0)
Neutrophils: 51 %
Platelets: 202 10*3/uL (ref 150–450)
RBC: 4.31 x10E6/uL (ref 3.77–5.28)
RDW: 13.5 % (ref 11.7–15.4)
WBC: 7.6 10*3/uL (ref 3.4–10.8)

## 2022-01-13 LAB — COMPREHENSIVE METABOLIC PANEL
ALT: 15 IU/L (ref 0–32)
AST: 24 IU/L (ref 0–40)
Albumin/Globulin Ratio: 1.8 (ref 1.2–2.2)
Albumin: 4.4 g/dL (ref 3.7–4.7)
Alkaline Phosphatase: 101 IU/L (ref 44–121)
BUN/Creatinine Ratio: 18 (ref 12–28)
BUN: 14 mg/dL (ref 8–27)
Bilirubin Total: 0.5 mg/dL (ref 0.0–1.2)
CO2: 23 mmol/L (ref 20–29)
Calcium: 9.7 mg/dL (ref 8.7–10.3)
Chloride: 101 mmol/L (ref 96–106)
Creatinine, Ser: 0.8 mg/dL (ref 0.57–1.00)
Globulin, Total: 2.5 g/dL (ref 1.5–4.5)
Glucose: 99 mg/dL (ref 70–99)
Potassium: 4.6 mmol/L (ref 3.5–5.2)
Sodium: 139 mmol/L (ref 134–144)
Total Protein: 6.9 g/dL (ref 6.0–8.5)
eGFR: 71 mL/min/{1.73_m2} (ref >59)

## 2022-01-13 LAB — LIPID PANEL
Chol/HDL Ratio: 4.9 ratio — ABNORMAL HIGH (ref 0.0–4.4)
Cholesterol, Total: 234 mg/dL — ABNORMAL HIGH (ref 100–199)
HDL: 48 mg/dL (ref >39)
LDL Chol Calc (NIH): 147 mg/dL — ABNORMAL HIGH (ref 0–99)
Triglycerides: 215 mg/dL — ABNORMAL HIGH (ref 0–149)
VLDL Cholesterol Cal: 39 mg/dL (ref 5–40)

## 2022-01-13 NOTE — Progress Notes (Signed)
Hi Ms. Senaida. It was nice to meet you yesterday.  Your lab work looks good.  No concerns at this time. Continue with your current medication regimen.  Follow up as discussed.  Please let me know if you have any questions.

## 2022-01-14 ENCOUNTER — Telehealth: Payer: Self-pay

## 2022-01-14 NOTE — Telephone Encounter (Signed)
Per vmx message left yesterday pt grandson wants to know why we haven't return paperwork that was drop off last Wednesday.Marland Kitchen He states he call somewhere yesterday other than [phone] and was told we don't have paperwork.

## 2022-01-14 NOTE — Telephone Encounter (Signed)
I spoke to Deborah Gibson ok per DPR... He is aware that patient assistance has been faxed to [facility] for [employer]... copy of form sent to be scanned   Faxed x 2 with receipt confirmed per fax report

## 2022-02-09 ENCOUNTER — Inpatient Hospital Stay: Payer: Medicare HMO | Attending: Oncology | Admitting: Hospice and Palliative Medicine

## 2022-02-09 ENCOUNTER — Telehealth: Payer: Medicare HMO | Admitting: Hospice and Palliative Medicine

## 2022-02-09 DIAGNOSIS — Z515 Encounter for palliative care: Secondary | ICD-10-CM

## 2022-02-09 DIAGNOSIS — C259 Malignant neoplasm of pancreas, unspecified: Secondary | ICD-10-CM

## 2022-02-09 NOTE — Progress Notes (Signed)
Virtual Visit via Telephone Note  I connected with [name] on 02/09/22 at  1:00 PM EST by telephone and verified that I am speaking with the correct person using two identifiers.  Location: Patient: Home Provider: Clinic   I discussed the limitations, risks, security and privacy concerns of performing an evaluation and management service by telephone and the availability of in person appointments. I also discussed with the patient that there may be a patient responsible charge related to this service. The patient expressed understanding and agreed to proceed.   History of Present Illness: LYFE REIHL is a 88 y.o. female with multiple medical problems including recently found pancreatic mass suspicious for pancreatic neoplasm.  Patient has met with medical oncology with discussions for further work-up and treatment.  Patient declined biopsy and systemic treatment.  She opted instead to focus on quality of life and comfort at home.     Observations/Objective: I called and spoke with patient by phone.    She reports that she is doing well.  She says that she "do not feel like there is anything wrong with me."  She denies any symptomatic complaints or concerns at present.  She reports stable appetite and performance status.  No weight loss.  Appreciate community palliative care following her.  She will benefit from future hospice involvement.  Assessment and Plan: Pancreatic mass -best supportive care.  Community palliative care following at home.  Patient will likely benefit from future hospice involvement at the point of clinical decline.    Follow Up Instructions: Follow-up telephone visit 1 to 2 months   I discussed the assessment and treatment plan with the patient. The patient was provided an opportunity to ask questions and all were answered. The patient agreed with the plan and demonstrated an understanding of the instructions.   The patient was advised to call back or  seek an in-person evaluation if the symptoms worsen or if the condition fails to improve as anticipated.  I provided 5 minutes of non-face-to-face time during this encounter.   Irean Hong, NP

## 2022-04-13 ENCOUNTER — Inpatient Hospital Stay: Payer: Medicare HMO | Attending: Oncology | Admitting: Hospice and Palliative Medicine

## 2022-04-13 DIAGNOSIS — Z515 Encounter for palliative care: Secondary | ICD-10-CM

## 2022-04-13 DIAGNOSIS — R978 Other abnormal tumor markers: Secondary | ICD-10-CM | POA: Insufficient documentation

## 2022-04-13 DIAGNOSIS — C259 Malignant neoplasm of pancreas, unspecified: Secondary | ICD-10-CM | POA: Diagnosis not present

## 2022-04-13 DIAGNOSIS — R911 Solitary pulmonary nodule: Secondary | ICD-10-CM | POA: Insufficient documentation

## 2022-04-13 DIAGNOSIS — K8689 Other specified diseases of pancreas: Secondary | ICD-10-CM | POA: Insufficient documentation

## 2022-04-13 NOTE — Progress Notes (Signed)
Virtual Visit via Telephone Note  I connected with [name] on 04/13/22 at  1:00 PM EST by telephone and verified that I am speaking with the correct person using two identifiers.  Location: Patient: Home Provider: Clinic   I discussed the limitations, risks, security and privacy concerns of performing an evaluation and management service by telephone and the availability of in person appointments. I also discussed with the patient that there may be a patient responsible charge related to this service. The patient expressed understanding and agreed to proceed.   History of Present Illness: Deborah Gibson is a 89 y.o. female with multiple medical problems including recently found pancreatic mass suspicious for pancreatic neoplasm.  Patient has met with medical oncology with discussions for further work-up and treatment.  Patient declined biopsy and systemic treatment.  She opted instead to focus on quality of life and comfort at home.     Observations/Objective: I called and spoke with patient by phone.    Patient reports that she has had some decline over the past several weeks to months.  She is bit weaker and now having generalized pain.  She has had some loose stools but denies diarrhea.  No fever or chills.  Appetite fair.    Patient states that her goals are still consistent with remaining home and focusing on comfort/quality of life.  We again discussed the option of pursuing hospice and patient stated that she was interested in speaking with him about services/options.  Assessment and Plan: Pancreatic mass -best supportive care.  Referral for hospice.  Follow Up Instructions: Follow-up telephone visit 1 to 2 months   I discussed the assessment and treatment plan with the patient. The patient was provided an opportunity to ask questions and all were answered. The patient agreed with the plan and demonstrated an understanding of the instructions.   The patient was advised  to call back or seek an in-person evaluation if the symptoms worsen or if the condition fails to improve as anticipated.  I provided 5 minutes of non-face-to-face time during this encounter.   Irean Hong, NP

## 2022-04-14 ENCOUNTER — Other Ambulatory Visit: Payer: Self-pay

## 2022-04-14 ENCOUNTER — Telehealth: Payer: Self-pay | Admitting: Hospice and Palliative Medicine

## 2022-04-14 DIAGNOSIS — C259 Malignant neoplasm of pancreas, unspecified: Secondary | ICD-10-CM

## 2022-04-14 NOTE — Telephone Encounter (Signed)
Fyi- Team Yu- pls coordinate apts with Dr. Tasia Catchings

## 2022-04-14 NOTE — Telephone Encounter (Signed)
Please contact pt/ pt's son to set up lab encounter, per Josh.

## 2022-04-14 NOTE — Telephone Encounter (Signed)
Per my last conversation with patient, patient is now experiencing symptoms and overall decline.  Referred her to hospice as patient had stated that she was not interested in pursuing any treatment.  Today, I received a call from hospice nurse who was in the house to enroll patient hospice services.  I spoke with patient and son.  Patient stated that she is convinced that she does not have cancer and that her symptoms must be explained by another cause.  Patient and son would like to repeat imaging to confirm and see if there has been any changes/progression.  Patient states that she still not interested in pursuing biopsy or any cancer treatment.  Discussed with Dr. Tasia Catchings and will bring patient in for labs (CBC, CMP, and CA 19-9) if renal function is okay, will proceed with contrasted CT of the chest, abdomen, and pelvis.  Follow up TBD.

## 2022-04-15 ENCOUNTER — Inpatient Hospital Stay: Payer: Medicare HMO

## 2022-04-15 DIAGNOSIS — R978 Other abnormal tumor markers: Secondary | ICD-10-CM | POA: Diagnosis not present

## 2022-04-15 DIAGNOSIS — C259 Malignant neoplasm of pancreas, unspecified: Secondary | ICD-10-CM

## 2022-04-15 DIAGNOSIS — K8689 Other specified diseases of pancreas: Secondary | ICD-10-CM | POA: Diagnosis not present

## 2022-04-15 DIAGNOSIS — R911 Solitary pulmonary nodule: Secondary | ICD-10-CM | POA: Diagnosis not present

## 2022-04-15 LAB — COMPREHENSIVE METABOLIC PANEL
ALT: 24 U/L (ref 0–44)
AST: 33 U/L (ref 15–41)
Albumin: 3.8 g/dL (ref 3.5–5.0)
Alkaline Phosphatase: 214 U/L — ABNORMAL HIGH (ref 38–126)
Anion gap: 9 (ref 5–15)
BUN: 30 mg/dL — ABNORMAL HIGH (ref 8–23)
CO2: 24 mmol/L (ref 22–32)
Calcium: 9.4 mg/dL (ref 8.9–10.3)
Chloride: 100 mmol/L (ref 98–111)
Creatinine, Ser: 1.19 mg/dL — ABNORMAL HIGH (ref 0.44–1.00)
GFR, Estimated: 44 mL/min — ABNORMAL LOW (ref >60)
Glucose, Bld: 144 mg/dL — ABNORMAL HIGH (ref 70–99)
Potassium: 4.7 mmol/L (ref 3.5–5.1)
Sodium: 133 mmol/L — ABNORMAL LOW (ref 135–145)
Total Bilirubin: 1.1 mg/dL (ref 0.3–1.2)
Total Protein: 8 g/dL (ref 6.5–8.1)

## 2022-04-15 LAB — CBC WITH DIFFERENTIAL/PLATELET
Abs Immature Granulocytes: 0.01 10*3/uL (ref 0.00–0.07)
Basophils Absolute: 0.1 10*3/uL (ref 0.0–0.1)
Basophils Relative: 1 %
Eosinophils Absolute: 0.2 10*3/uL (ref 0.0–0.5)
Eosinophils Relative: 2 %
HCT: 40.1 % (ref 36.0–46.0)
Hemoglobin: 13.2 g/dL (ref 12.0–15.0)
Immature Granulocytes: 0 %
Lymphocytes Relative: 19 %
Lymphs Abs: 1.7 10*3/uL (ref 0.7–4.0)
MCH: 28.9 pg (ref 26.0–34.0)
MCHC: 32.9 g/dL (ref 30.0–36.0)
MCV: 87.7 fL (ref 80.0–100.0)
Monocytes Absolute: 0.9 10*3/uL (ref 0.1–1.0)
Monocytes Relative: 10 %
Neutro Abs: 6.3 10*3/uL (ref 1.7–7.7)
Neutrophils Relative %: 68 %
Platelets: 246 10*3/uL (ref 150–400)
RBC: 4.57 MIL/uL (ref 3.87–5.11)
RDW: 12.7 % (ref 11.5–15.5)
WBC: 9.1 10*3/uL (ref 4.0–10.5)
nRBC: 0 % (ref 0.0–0.2)

## 2022-04-16 ENCOUNTER — Other Ambulatory Visit: Payer: Self-pay

## 2022-04-16 DIAGNOSIS — C259 Malignant neoplasm of pancreas, unspecified: Secondary | ICD-10-CM

## 2022-04-16 LAB — CANCER ANTIGEN 19-9: CA 19-9: 7445 U/mL — ABNORMAL HIGH (ref 0–35)

## 2022-04-16 NOTE — Telephone Encounter (Signed)
Order has been entered

## 2022-04-20 ENCOUNTER — Telehealth: Payer: Self-pay

## 2022-04-20 NOTE — Telephone Encounter (Signed)
3 pm.  Phone call made to patient to schedule a home visit.  Patient was seen by hospice last week but declined admission and requested further work up.  Patient will have an MRI on 04/23/22 and requested follow up call be made next month to schedule.  Patient advised she would like to know what she is dealing with before scheduling with Palliative Care.

## 2022-04-21 ENCOUNTER — Other Ambulatory Visit: Payer: Medicare HMO

## 2022-04-23 ENCOUNTER — Ambulatory Visit
Admission: RE | Admit: 2022-04-23 | Discharge: 2022-04-23 | Disposition: A | Discharge status: Home / Self Care | Payer: Medicare HMO | Source: Ambulatory Visit | Attending: Oncology | Admitting: Oncology

## 2022-04-23 DIAGNOSIS — K8689 Other specified diseases of pancreas: Secondary | ICD-10-CM | POA: Diagnosis not present

## 2022-04-23 DIAGNOSIS — C259 Malignant neoplasm of pancreas, unspecified: Secondary | ICD-10-CM | POA: Insufficient documentation

## 2022-04-23 DIAGNOSIS — C787 Secondary malignant neoplasm of liver and intrahepatic bile duct: Secondary | ICD-10-CM | POA: Diagnosis not present

## 2022-04-23 DIAGNOSIS — N1330 Unspecified hydronephrosis: Secondary | ICD-10-CM | POA: Diagnosis not present

## 2022-04-23 DIAGNOSIS — K8020 Calculus of gallbladder without cholecystitis without obstruction: Secondary | ICD-10-CM | POA: Diagnosis not present

## 2022-04-23 MED ORDER — GADOBUTROL 1 MMOL/ML IV SOLN
7.0000 mL | Freq: Once | INTRAVENOUS | Status: AC | PRN
  Administered 2022-04-23: 7 mL via INTRAVENOUS

## 2022-04-24 ENCOUNTER — Telehealth: Payer: Self-pay | Admitting: Hospice and Palliative Medicine

## 2022-04-24 DIAGNOSIS — C259 Malignant neoplasm of pancreas, unspecified: Secondary | ICD-10-CM

## 2022-04-24 NOTE — Telephone Encounter (Signed)
I called and spoke with patient's son.  Together we reviewed the results of patient's MRI now showing widespread metastatic disease.  Son recognizes that patient is likely nearing end-of-life and confirmed desire to focus on comfort care at home.  He was in agreement with hospice.  Will send new referral.

## 2022-05-29 DEATH — deceased

## 2022-06-26 ENCOUNTER — Inpatient Hospital Stay: Payer: Medicare HMO | Admitting: Hospice and Palliative Medicine

## 2022-07-14 ENCOUNTER — Ambulatory Visit: Payer: Medicare HMO | Admitting: Nurse Practitioner
